# Patient Record
Sex: Male | Born: 1955 | Race: White | Hispanic: No | Smoking: Current every day smoker
Health system: Southern US, Community
[De-identification: ages and names within clinical notes are randomized; demographics above are authoritative.]

## PROBLEM LIST (undated history)

## (undated) ENCOUNTER — Emergency Department (HOSPITAL_COMMUNITY): Payer: 59

## (undated) DIAGNOSIS — G8929 Other chronic pain: Secondary | ICD-10-CM

## (undated) DIAGNOSIS — Z720 Tobacco use: Secondary | ICD-10-CM

## (undated) DIAGNOSIS — A6000 Herpesviral infection of urogenital system, unspecified: Secondary | ICD-10-CM

## (undated) DIAGNOSIS — G629 Polyneuropathy, unspecified: Secondary | ICD-10-CM

## (undated) DIAGNOSIS — F419 Anxiety disorder, unspecified: Secondary | ICD-10-CM

## (undated) DIAGNOSIS — G839 Paralytic syndrome, unspecified: Secondary | ICD-10-CM

## (undated) DIAGNOSIS — K5900 Constipation, unspecified: Secondary | ICD-10-CM

## (undated) DIAGNOSIS — M1990 Unspecified osteoarthritis, unspecified site: Secondary | ICD-10-CM

## (undated) DIAGNOSIS — E039 Hypothyroidism, unspecified: Secondary | ICD-10-CM

## (undated) DIAGNOSIS — N400 Enlarged prostate without lower urinary tract symptoms: Secondary | ICD-10-CM

## (undated) HISTORY — DX: Hypothyroidism, unspecified: E03.9

## (undated) HISTORY — DX: Paralytic syndrome, unspecified: G83.9

## (undated) HISTORY — DX: Tobacco use: Z72.0

## (undated) HISTORY — DX: Constipation, unspecified: K59.00

## (undated) HISTORY — DX: Herpesviral infection of urogenital system, unspecified: A60.00

## (undated) HISTORY — DX: Anxiety disorder, unspecified: F41.9

---

## 1980-11-07 HISTORY — PX: CHOLECYSTECTOMY: SHX55

## 1989-11-07 HISTORY — PX: MASTOIDECTOMY: SHX711

## 1994-11-07 HISTORY — PX: OTHER SURGICAL HISTORY: SHX169

## 1999-11-08 HISTORY — PX: COLONOSCOPY: SHX174

## 2002-04-14 ENCOUNTER — Emergency Department (HOSPITAL_COMMUNITY): Admission: EM | Admit: 2002-04-14 | Discharge: 2002-04-14 | Payer: Self-pay | Admitting: *Deleted

## 2002-04-14 ENCOUNTER — Encounter: Payer: Self-pay | Admitting: *Deleted

## 2006-06-07 ENCOUNTER — Emergency Department (HOSPITAL_COMMUNITY): Admission: EM | Admit: 2006-06-07 | Discharge: 2006-06-07 | Payer: Self-pay | Admitting: Emergency Medicine

## 2006-06-26 ENCOUNTER — Emergency Department (HOSPITAL_COMMUNITY): Admission: EM | Admit: 2006-06-26 | Discharge: 2006-06-26 | Payer: Self-pay | Admitting: Emergency Medicine

## 2006-07-06 ENCOUNTER — Inpatient Hospital Stay (HOSPITAL_COMMUNITY): Admission: EM | Admit: 2006-07-06 | Discharge: 2006-07-08 | Payer: Self-pay | Admitting: Emergency Medicine

## 2006-07-09 ENCOUNTER — Emergency Department (HOSPITAL_COMMUNITY): Admission: EM | Admit: 2006-07-09 | Discharge: 2006-07-09 | Payer: Self-pay | Admitting: Emergency Medicine

## 2006-08-01 ENCOUNTER — Emergency Department (HOSPITAL_COMMUNITY): Admission: EM | Admit: 2006-08-01 | Discharge: 2006-08-02 | Payer: Self-pay | Admitting: Emergency Medicine

## 2006-08-11 ENCOUNTER — Emergency Department (HOSPITAL_COMMUNITY): Admission: EM | Admit: 2006-08-11 | Discharge: 2006-08-11 | Payer: Self-pay | Admitting: Emergency Medicine

## 2007-07-16 ENCOUNTER — Emergency Department (HOSPITAL_COMMUNITY): Admission: EM | Admit: 2007-07-16 | Discharge: 2007-07-16 | Payer: Self-pay | Admitting: Emergency Medicine

## 2007-11-08 HISTORY — PX: NECK SURGERY: SHX720

## 2007-11-24 ENCOUNTER — Emergency Department (HOSPITAL_COMMUNITY): Admission: EM | Admit: 2007-11-24 | Discharge: 2007-11-24 | Payer: Self-pay | Admitting: Emergency Medicine

## 2008-03-01 ENCOUNTER — Emergency Department (HOSPITAL_COMMUNITY): Admission: EM | Admit: 2008-03-01 | Discharge: 2008-03-01 | Payer: Self-pay | Admitting: Emergency Medicine

## 2008-03-14 ENCOUNTER — Ambulatory Visit (HOSPITAL_COMMUNITY): Admission: RE | Admit: 2008-03-14 | Discharge: 2008-03-14 | Payer: Self-pay | Admitting: Pulmonary Disease

## 2008-06-02 ENCOUNTER — Emergency Department (HOSPITAL_COMMUNITY): Admission: EM | Admit: 2008-06-02 | Discharge: 2008-06-02 | Payer: Self-pay | Admitting: Emergency Medicine

## 2008-07-13 ENCOUNTER — Inpatient Hospital Stay (HOSPITAL_COMMUNITY): Admission: EM | Admit: 2008-07-13 | Discharge: 2008-08-12 | Payer: Self-pay | Admitting: Emergency Medicine

## 2008-07-13 ENCOUNTER — Encounter: Payer: Self-pay | Admitting: Emergency Medicine

## 2008-08-28 ENCOUNTER — Encounter (HOSPITAL_COMMUNITY): Admission: RE | Admit: 2008-08-28 | Discharge: 2008-09-27 | Payer: Self-pay | Admitting: Pulmonary Disease

## 2008-09-03 ENCOUNTER — Ambulatory Visit (HOSPITAL_COMMUNITY): Admission: RE | Admit: 2008-09-03 | Discharge: 2008-09-03 | Payer: Self-pay | Admitting: Pulmonary Disease

## 2008-09-30 ENCOUNTER — Encounter (HOSPITAL_COMMUNITY): Admission: RE | Admit: 2008-09-30 | Discharge: 2008-11-05 | Payer: Self-pay | Admitting: Pulmonary Disease

## 2008-10-03 ENCOUNTER — Ambulatory Visit (HOSPITAL_COMMUNITY): Admission: RE | Admit: 2008-10-03 | Discharge: 2008-10-03 | Payer: Self-pay | Admitting: Pulmonary Disease

## 2008-11-10 ENCOUNTER — Encounter (HOSPITAL_COMMUNITY): Admission: RE | Admit: 2008-11-10 | Discharge: 2008-12-10 | Payer: Self-pay | Admitting: Pulmonary Disease

## 2008-12-02 ENCOUNTER — Ambulatory Visit (HOSPITAL_COMMUNITY): Admission: RE | Admit: 2008-12-02 | Discharge: 2008-12-02 | Payer: Self-pay | Admitting: Pulmonary Disease

## 2009-01-29 ENCOUNTER — Emergency Department (HOSPITAL_COMMUNITY): Admission: EM | Admit: 2009-01-29 | Discharge: 2009-01-29 | Payer: Self-pay | Admitting: Emergency Medicine

## 2009-02-05 ENCOUNTER — Emergency Department (HOSPITAL_COMMUNITY): Admission: EM | Admit: 2009-02-05 | Discharge: 2009-02-05 | Payer: Self-pay | Admitting: Emergency Medicine

## 2009-02-10 ENCOUNTER — Ambulatory Visit (HOSPITAL_COMMUNITY): Admission: RE | Admit: 2009-02-10 | Discharge: 2009-02-10 | Payer: Self-pay | Admitting: Pulmonary Disease

## 2009-02-17 ENCOUNTER — Emergency Department (HOSPITAL_COMMUNITY): Admission: EM | Admit: 2009-02-17 | Discharge: 2009-02-17 | Payer: Self-pay | Admitting: Emergency Medicine

## 2009-03-11 ENCOUNTER — Emergency Department (HOSPITAL_COMMUNITY): Admission: EM | Admit: 2009-03-11 | Discharge: 2009-03-11 | Payer: Self-pay | Admitting: Emergency Medicine

## 2009-05-10 ENCOUNTER — Emergency Department (HOSPITAL_COMMUNITY): Admission: EM | Admit: 2009-05-10 | Discharge: 2009-05-10 | Payer: Self-pay | Admitting: Emergency Medicine

## 2009-05-31 ENCOUNTER — Emergency Department (HOSPITAL_COMMUNITY): Admission: EM | Admit: 2009-05-31 | Discharge: 2009-05-31 | Payer: Self-pay | Admitting: Emergency Medicine

## 2009-09-19 IMAGING — CR DG CHEST 1V PORT
1 series · 1 of 1 positions shown · non-contrast
Comparison: none

CLINICAL DATA: Chest pain, short of breath.
 PORTABLE CHEST ? 1 VIEW ? 11/24/07 ? 0049 hours:

[view not recorded]
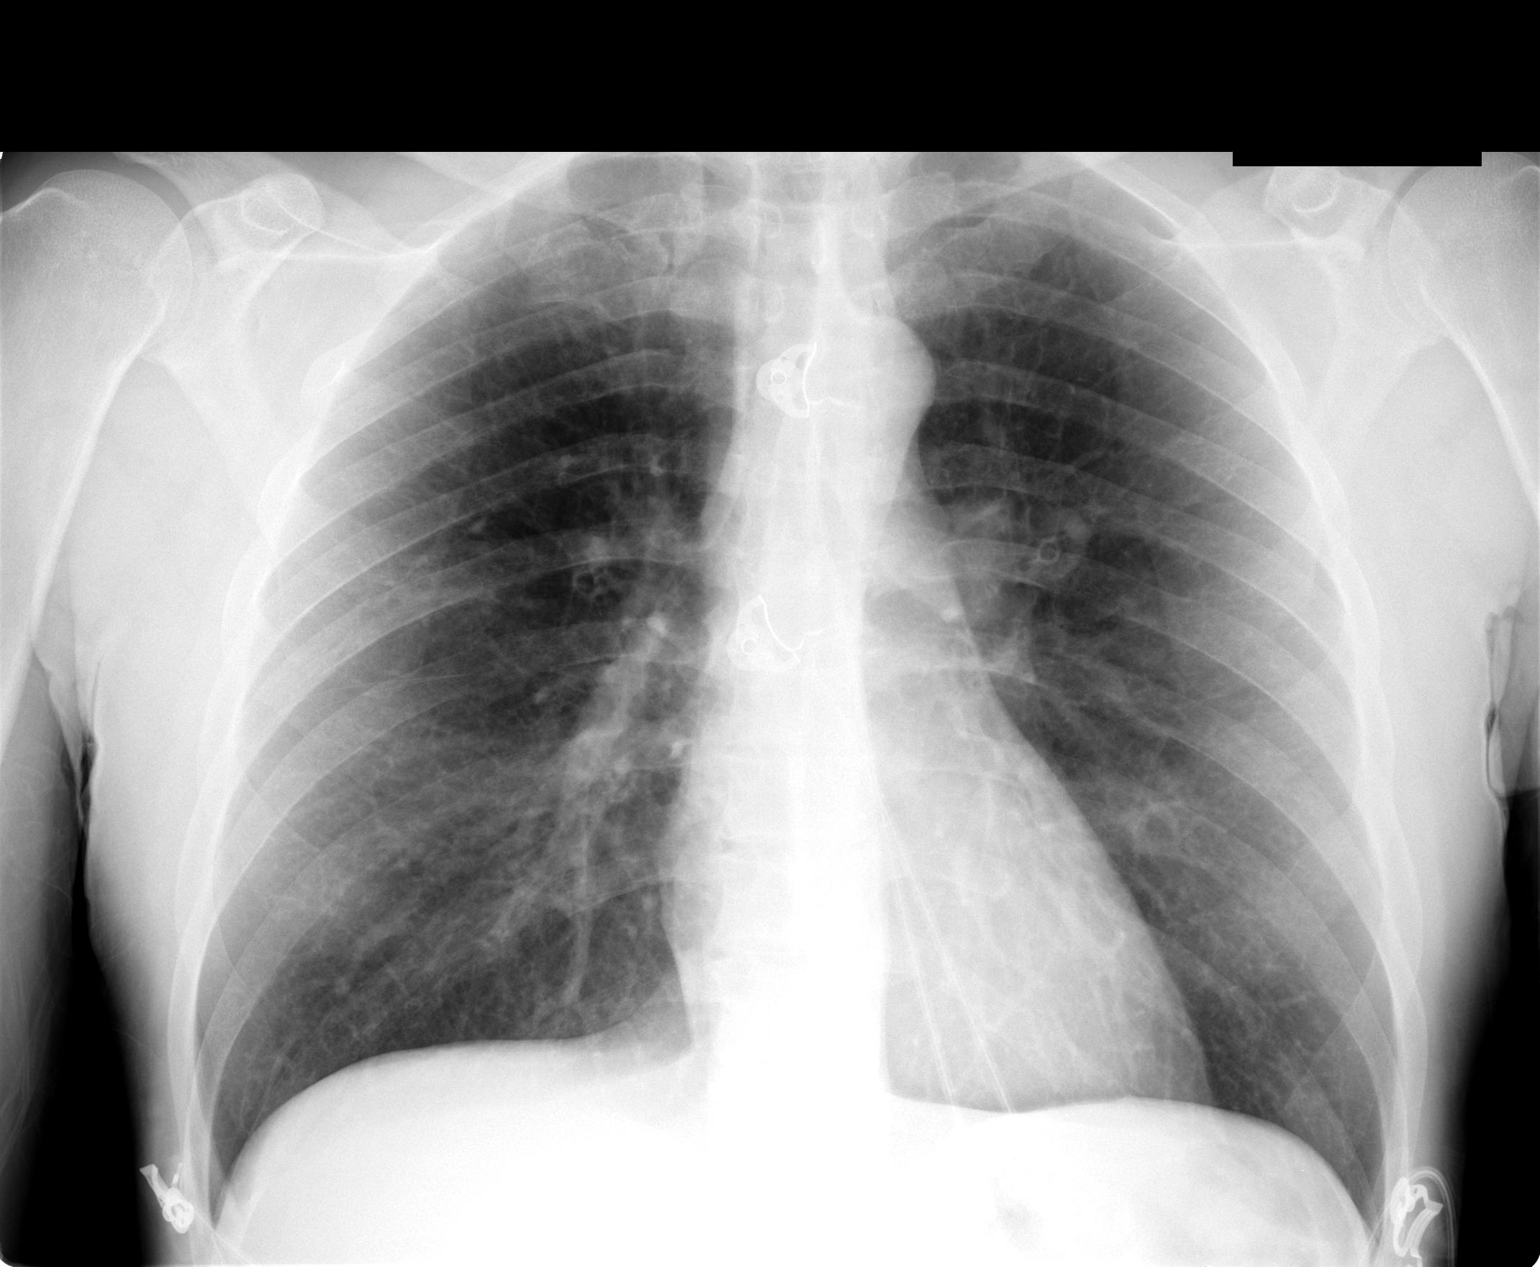

[1 of 1 positions shown; findings below may reference images not displayed]

FINDINGS: No infiltrate or effusion is seen.  The lungs are hyperaerated.  The heart is within normal limits in size.
IMPRESSION: No active lung disease.  Lungs are hyperaerated.

## 2010-01-03 ENCOUNTER — Emergency Department (HOSPITAL_COMMUNITY): Admission: EM | Admit: 2010-01-03 | Discharge: 2010-01-03 | Payer: Self-pay | Admitting: Emergency Medicine

## 2010-07-25 ENCOUNTER — Emergency Department (HOSPITAL_COMMUNITY): Admission: EM | Admit: 2010-07-25 | Discharge: 2010-07-25 | Payer: Self-pay | Admitting: Emergency Medicine

## 2011-02-13 LAB — COMPREHENSIVE METABOLIC PANEL
ALT: 14 U/L (ref 0–53)
AST: 16 U/L (ref 0–37)
Alkaline Phosphatase: 74 U/L (ref 39–117)
CO2: 29 mEq/L (ref 19–32)
Calcium: 9.2 mg/dL (ref 8.4–10.5)
Chloride: 106 mEq/L (ref 96–112)
GFR calc Af Amer: >60 mL/min (ref >60)
GFR calc non Af Amer: >60 mL/min (ref >60)
Glucose, Bld: 98 mg/dL (ref 70–99)
Sodium: 138 mEq/L (ref 135–145)
Total Bilirubin: 0.3 mg/dL (ref 0.3–1.2)

## 2011-02-13 LAB — DIFFERENTIAL
Basophils Absolute: 0 10*3/uL (ref 0.0–0.1)
Basophils Relative: 0 % (ref 0–1)
Eosinophils Absolute: 0.2 10*3/uL (ref 0.0–0.7)
Eosinophils Relative: 2 % (ref 0–5)
Lymphs Abs: 2.9 10*3/uL (ref 0.7–4.0)
Neutrophils Relative %: 56 % (ref 43–77)

## 2011-02-13 LAB — POCT CARDIAC MARKERS: Myoglobin, poc: 39.7 ng/mL (ref 12–200)

## 2011-02-13 LAB — CBC
Hemoglobin: 14.7 g/dL (ref 13.0–17.0)
MCHC: 35.1 g/dL (ref 30.0–36.0)
RBC: 4.64 MIL/uL (ref 4.22–5.81)
WBC: 8.5 10*3/uL (ref 4.0–10.5)

## 2011-02-16 LAB — DIFFERENTIAL
Basophils Absolute: 0 10*3/uL (ref 0.0–0.1)
Basophils Relative: 0 % (ref 0–1)
Eosinophils Absolute: 0.1 10*3/uL (ref 0.0–0.7)
Monocytes Relative: 6 % (ref 3–12)
Neutro Abs: 6.4 10*3/uL (ref 1.7–7.7)
Neutrophils Relative %: 74 % (ref 43–77)

## 2011-02-16 LAB — CBC
MCHC: 34 g/dL (ref 30.0–36.0)
MCV: 91.1 fL (ref 78.0–100.0)
RBC: 4.69 MIL/uL (ref 4.22–5.81)
RDW: 13.1 % (ref 11.5–15.5)

## 2011-02-16 LAB — URINALYSIS, ROUTINE W REFLEX MICROSCOPIC
Bilirubin Urine: NEGATIVE
Nitrite: NEGATIVE
Protein, ur: NEGATIVE mg/dL
Specific Gravity, Urine: 1.01 (ref 1.005–1.030)
Urobilinogen, UA: 0.2 mg/dL (ref 0.0–1.0)

## 2011-02-16 LAB — BASIC METABOLIC PANEL
BUN: 12 mg/dL (ref 6–23)
CO2: 26 mEq/L (ref 19–32)
Calcium: 9.4 mg/dL (ref 8.4–10.5)
Chloride: 97 mEq/L (ref 96–112)
Creatinine, Ser: 0.69 mg/dL (ref 0.4–1.5)
GFR calc Af Amer: >60 mL/min (ref >60)
Glucose, Bld: 95 mg/dL (ref 70–99)

## 2011-03-02 ENCOUNTER — Emergency Department (HOSPITAL_COMMUNITY)
Admission: EM | Admit: 2011-03-02 | Discharge: 2011-03-02 | Disposition: A | Discharge status: Home / Self Care | Payer: Medicare Other | Attending: Emergency Medicine | Admitting: Emergency Medicine

## 2011-03-02 DIAGNOSIS — Z79899 Other long term (current) drug therapy: Secondary | ICD-10-CM | POA: Insufficient documentation

## 2011-03-02 DIAGNOSIS — Y93E9 Activity, other interior property and clothing maintenance: Secondary | ICD-10-CM | POA: Insufficient documentation

## 2011-03-02 DIAGNOSIS — F411 Generalized anxiety disorder: Secondary | ICD-10-CM | POA: Insufficient documentation

## 2011-03-02 DIAGNOSIS — IMO0002 Reserved for concepts with insufficient information to code with codable children: Secondary | ICD-10-CM | POA: Insufficient documentation

## 2011-03-02 DIAGNOSIS — G43909 Migraine, unspecified, not intractable, without status migrainosus: Secondary | ICD-10-CM | POA: Insufficient documentation

## 2011-03-02 DIAGNOSIS — Y92009 Unspecified place in unspecified non-institutional (private) residence as the place of occurrence of the external cause: Secondary | ICD-10-CM | POA: Insufficient documentation

## 2011-03-02 DIAGNOSIS — W268XXA Contact with other sharp object(s), not elsewhere classified, initial encounter: Secondary | ICD-10-CM | POA: Insufficient documentation

## 2011-03-02 DIAGNOSIS — Y998 Other external cause status: Secondary | ICD-10-CM | POA: Insufficient documentation

## 2011-03-22 NOTE — Op Note (Signed)
Jaime [name], MILICH NO.:  [id number]   MEDICAL RECORD NO.:  [id number]          PATIENT TYPE:  INP   LOCATION:  3105                         FACILITY:  [facility]   PHYSICIAN:  Payton Doughty, M.D.      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   POSTOPERATIVE DIAGNOSIS:  Central cord injury with severe spondylosis at  C3-C4, C4-C5, C5-C6 and C6-C7.   PROCEDURE:  C3-C4, C4-C5, C5-C6 and C6-C7 anterior cervical  decompression and fusion with Reflex hybrid plate.   SURGEON:  Payton Doughty, MD   ANESTHESIA:  General endotracheal.   PREPARATION:  Prepped and draped with alcohol wipe.   COMPLICATIONS:  None.   NURSE ASSISTANT:  [place].   DOCTOR'S ASSISTANT:  Hilda Lias, MD   BODY OF TEXT:  This is a 55 year old gentleman with central cord  syndrome following a fall and severe spondylitic disease at the above-  named levels.  He was taken to the operating room, smoothly anesthetized  and intubated, and placed supine on the operating table.  The neck was  not extended during intubation nor in positioning.  He was placed in the  halter head traction without extension and 5 pounds of traction.  Following shave, prep, and drape in usual sterile fashion, the skin was  incised paralleling the sternocleidomastoid muscle starting 2  fingerbreadths above the sternal notch and extending up to within 2 cm  of the mandible.  The platysma was identified, elevated, divided, and  undermined.  Sternocleidomastoid was identified.  Medial dissection  revealed the carotid artery retracted laterally to the left.  Trachea  and esophagus were dissected free and retracted laterally to the right  exposing the bones in the anterior cervical spine.  Marker was placed.  Intraoperative x-ray  obtained to confirm correctness of the level.  The  longus colli was taken down bilaterally and Shadow-Line self-retaining  retractor was placed transversely in a cephalocaudal direction.  The  diskectomy was then carried out at each level under gross observation.  The operating microscope was brought in.  We used microdissection  technique to remove the remaining disk, removed posteriorly protruding  osteophytes, divided the posterior longitudinal ligament, and explored  the neural foramina bilaterally.  The C3-C4 and C6-C7 had significant  spondylitic disease with some compression of the cord.  C4-C5 and C5-C6  were the worst, both with large osteophytes and herniated disk at each  level.  Following complete decompression at each level, an 8-mm bone  graft was fashioned from patellar allograft and tapped into place.  Following placement of all the grafts, a 72-mm Reflex hybrid plate was  placed with 12-mm screws, two in C3, two in C4, two in C5, two in C6,  and two in C7.  Intraoperative x-ray showed good placement of  bone graft plate and screws.  Wound was irrigated.  Hemostasis assured.  Successive layers  of 3-0 Vicryl and 4-0 nylon were used to close.  Prior  to closure, a Jackson-Pratt drain was inserted through the prevertebral  space.  The patient placed in his Aspen collar and returned to the  recovery room in good condition.           ______________________________  Payton Doughty, M.D.     MWR/MEDQ  D:  07/18/2008  T:  07/19/2008  Job:  [id number]

## 2011-03-22 NOTE — H&P (Signed)
Jaime [name], GAWRON NO.:  [id number]   MEDICAL RECORD NO.:  [id number]          PATIENT TYPE:  INP   LOCATION:  3105                         FACILITY:  [facility]   PHYSICIAN:  Payton Doughty, M.D.      DATE OF BIRTH:  01/22/1956   DATE OF ADMISSION:  07/13/2008  DATE OF DISCHARGE:                              HISTORY & PHYSICAL   ADMISSION DIAGNOSIS:  Spinal cord injury, central cord syndrome.   SURGEON:  Payton Doughty, MD   BODY OF TEXT:  This 55 year old right-handed white gentleman fell  intoxicated last night and struck his head with onset of quadriparesis.  He was transported to [facility].  CT was negative for fracture.  He was  started on Solu-Medrol and transported to [facility].  He had an MRI that  shows diffuse spondylosis with cord contusion at C3-C7.  It is noted  that he is improved in his lower extremities and proximal arms but has  persistent weakness is his distal upper extremities and painful  dysesthesias in his hands.   MEDICAL HISTORY:  Negative for significant diseases.   SURGICAL HISTORY:  He has had a left acoustic neuroma resection about 12  years ago.  He has also had a right mastoidectomy.  He has also had a  cholecystectomy.   MEDICATIONS:  Vicodin and Xanax.   ALLERGIES:  SULFA.   SOCIAL HISTORY:  He does smoke a pack of cigarettes a day, drinks 5-6  beers a day, and is on disability.   FAMILY HISTORY:  Negative.   REVIEW OF SYSTEMS:  Remarkable for neck and hand pain.   PHYSICAL EXAMINATION:  HEENT:  He has contusion in left malar eminence  and orbit, but it is an old fracture on CT.  NECK:  Slightly tender.  He holds it rigidly.  CHEST:  He has coarse crackles with good breath sounds bilaterally.  CARDIAC:  Regular rate and rhythm without murmur.  ABDOMEN:  Soft with positive bowel sounds.  EXTREMITIES:  Without clubbing or cyanosis.  Pulses are good.  GU:  Deferred.  NEUROLOGIC:  He is awake, alert, and oriented.  His cranial  nerves  appear to function normally, except for diminished hearing, little bit  worse on the left than on the right.  Motor exam in the deltoid on the  right is 4+, left is 4+.  Biceps on the right is 4-, left is 4-.  On the  triceps, he has strength of 1, meaning there is contraction with little  movement of the joints.  His wrist extensors, intrinsic muscles, and  grips were all 1 bilaterally in his hands and these are limited by the  dysesthesias that he experiences.  Lower extremities have full strength.  Sensation is diminished over the C6-T2 dermatomes with painful  dysesthesias.  Reflexes are absent at the triceps and 1 at the biceps  and knees.  Hoffman's is negative.   CLINICAL IMPRESSION:  Central cord syndrome after a forced extension  injury with improving quadriparesis.  I am going to leave him on the  Solu-Medrol  and decompress and stabilize him probably on Wednesday.  Obviously, if he should worsen that would be an indication for urgent  decompression, but as he is improving, I think the safer course is to  let the steroid do its work and see if we can improve him more safely in  a couple of days.           ______________________________  Payton Doughty, M.D.     MWR/MEDQ  D:  07/13/2008  T:  07/13/2008  Job:  [id number]

## 2011-03-22 NOTE — Discharge Summary (Signed)
Jaime [name], Jaime [name] NO.:  [id number]   MEDICAL RECORD NO.:  [id number]          PATIENT TYPE:  INP   LOCATION:  3034                         FACILITY:  [facility]   PHYSICIAN:  Payton Doughty, M.D.      DATE OF BIRTH:  01/14/1956   DATE OF ADMISSION:  07/13/2008  DATE OF DISCHARGE:  08/12/2008                               DISCHARGE SUMMARY   ADMITTING DIAGNOSIS:  Spinal cord injury with central cord syndrome.   DISCHARGE DIAGNOSIS:  Spinal cord injury with central cord syndrome.   OPERATIVE PROCEDURE:  C3-4, C4-5, C5-6, and C6-7 anterior cervical  decompression and fusion, Reflex hybrid plate.   SURGEON:  Payton Doughty, MD, Neurosurgery.   COMPLICATIONS:  None.   DISCHARGE STATUS:  Alive.   BODY TEXT:  This is a 55 year old gentleman.  His history and physical  was recounted in the chart basically on July 12, 2008, he was drunk  at a bar fell and hit his head and became quadriparesis cleared except  for his hands.  He was transported to [facility], CT revealed a central cord  injury.  He was admitted to [facility] in the ICU and observed for several  days.  He actually improved, but still had painful dysesthesias in his  hands.  He was placed on steroid protocol and after several days, his  exam had plateaued where he had about 4/5 strength in his biceps, 0/5 in  his wrist extensors, and about 2/5 in his triceps.  On July 18, 2008, he was taken to the OR and underwent a fusion from C3-C7 with a  decompression in his cord.  Postoperatively, he has done reasonably  well, biggest issues has been dysphagia.  He underwent a swallowing  evaluation, was placed n.p.o. for sometime, and had a NG tube in.  During this time, he developed a little bit of DTs which was rapidly  corrective with Librium, which he was tapered off of.  He also developed  some rashes, was probably allergic to the NICOTINE PATCH that he had  been on.  Once that was stopped, his rash began clearing up.   He has  undergone physical therapy.  Speech has allowed his diet to progress to  dysphagia 1, which has pureed foods and  Thick-It in all his  liquids.  He is on a water protocol to help him practice swallowing liquids.  He  is able to get home.  He has a wheelchair at home.   PHYSICAL EXAMINATION:  Strength is full, save for the hands, which are  about 3-/5 on the right and 3+/5 on the left.  Incisions well healed.  He is out of his collar.   He is being discharged home in care of his family with Vicodin for pain.  His follow will be in the [place] offices in about a month.  He has got  home PT, OT, and speech set up.           ______________________________  Payton Doughty, M.D.    MWR/MEDQ  D:  08/12/2008  T:  08/12/2008  Job:  [id number]

## 2011-03-25 NOTE — H&P (Signed)
NAMEDSHAUN, [name]                 ACCOUNT NO.:  [id number]   MEDICAL RECORD NO.:  [id number]          PATIENT TYPE:  INP   LOCATION:  3036                         FACILITY:  [facility]   PHYSICIAN:  Jaime [name], M.D.DATE OF BIRTH:  April 26, 1956   DATE OF ADMISSION:  07/06/2006  DATE OF DISCHARGE:                                HISTORY & PHYSICAL   DATE OF ADMISSION:  July 06, 2006.   PRIMARY CARE PHYSICIAN:  Unassigned.  (Dr. Juanetta [name]).   CHIEF COMPLAINT:  Right ear pain and facial rash.   HISTORY OF PRESENT ILLNESS:  The patient is a 55 year old, white male who  present with the above complaints.  He states that a little over three weeks  ago, he began having a right earache and developed redness at the auricle of  that right side and so went to the emergency room in [place] on June 12, 2006.  At that time, he was given a prescription of Cipro for five days  and after completing the antibiotics, his symptoms had not improved any.  In  fact, he states that the redness had begun to spread from his ear/auricle  across his face and so he went in to see his primary care physician, Dr.  Juanetta [name], on June 18, 2006.  He states that Dr. Juanetta [name] gave him a  prescription for Cipro for ten more days but because the redness continued  to spread across his face and the pain in his ear was worsening, he went  back to the emergency room on June 26, 2006 and at that point, he was  referred to an ENT physician, Dr. Christella [name].  He states that  Dr. Christella [name] asked him to stop taking the Cipro which he had taken six of the  ten day prescription that his primary care physician had added on.  He  states that he was given hydrocortisone cream and ear drops and after he  began applying this medications, he states that his face began to slough  off, became scaly.  He states that the rash on his face is pruritic and he  has a burning sensation as well over his face.  Because his symptoms  continued to  progress, he came to the [facility] emergency room for further  evaluation.  He admits to subjective fevers.  He denies cough, nausea,  vomiting, hematemesis, chest pain, dysuria, melena and diarrhea.   The patient was seen in the emergency room and a maxillofacial CT scan was  done and it showed findings on the right, most compatible with superficial  cellulitis of the right ear and periauricular soft tissues, minimal mastoid  opacity present on the right without coalescence but soft tissue  opacification of the remaining mastoid air cells noted on the left with soft  tissue extension into the middle ear and in the setting of previous  bilateral surgery, the acuity of findings was noted to be unclear.  A  cholesteatoma cannot be excluded on the left.  ENT was consulted per the  emergency room physician and recommendations made for the patient to  be  placed on Avelox and Ciprodex drops and he recommended a medical evaluation  and the patient is admitted to the hospitalist's service for further  evaluation and management.   PAST MEDICAL HISTORY:  1. As stated above.  2. History of genital herpes.  3. History of panic attacks.  4. Tobacco abuse.  5. Hearing impairment.   MEDICATIONS:  1. Vicodin four times daily p.r.n.;  2. Valtrex 500 mg daily;  3. Xanax 0.5 mg three times daily p.r.n.   ALLERGIES:  1. SULFA.  2. CORTICOSPORINS.   SOCIAL HISTORY:  Positive for tobacco, occasional alcohol.   FAMILY HISTORY:  His dad had heart disease and lung cancer.  His sister has  melanoma.   REVIEW OF SYSTEMS:  As per history of present illness.  Other review of  systems negative.   PHYSICAL EXAMINATION:  GENERAL:  The patient is a pleasant, middle-aged,  white male.  He looks older than his stated age.  His whole face is very  erythematous.  He is in no apparent distress.  VITAL SIGNS:  Temperature 96.5 with a blood pressure of 114/76, pulse 67,  respiratory rate 18, oxygen saturation  96%.  HEENT:  Diffuse facial erythema, macular rash.  Pupils are equal, round and  reactive to light.  Extraocular movements are intact.  Sclerae are  anicteric.  Moist mucous membranes.  No oral exudates.  His right auricle is  very erythematous as well.  There is a fissure in the area of his right  earlobe with clotted blood.  The external canal is mildly edematous and not  very tender and the right tympanic membrane has some dry debris.  The left  ear appears to be within normal limits.  NECK:  There is some erythema extending down his neck.  Otherwise, supple,  no adenopathy and no thyromegaly.  No jugular venous distension.  LUNGS:  Clear to auscultation bilaterally.  No crackles or wheezes.  CARDIOVASCULAR:  Regular rate and rhythm.  Normal S1 and S2.  ABDOMEN:  Soft.  Bowel sounds present.  Nontender, nondistended.  No  organomegaly and no masses palpable.  EXTREMITIES:  No cyanosis and no edema.  NEUROLOGICAL:  Alert and oriented x3.  Cranial nerves II through XII grossly  intact.  Nonfocal  examination.   LABORATORY DATA:  Maxillofacial CT scan as above.   His white blood cell count is 13.6 with a hemoglobin of 16, hematocrit 46.1,  platelet count 184,000.  Neutrophil count 73%.  Sodium is 140, potassium  3.7, chloride 107, BUN 8, glucose 93, pH 7.32.  His creatinine is 1.0.   ASSESSMENT/PLAN:  1. Facial and right ear/periauricular cellulitis.  Will obtain blood      cultures, start on empiric antibiotics to include methicillin-resistant      Staphylococcus aureus coverage as per history discussed above.  The      patient has been on Cipro for a total of eleven days with no      improvement.  ENT consulted.  Will follow and consider a      dermatology consultation for recommendations if no improvement.  2. Otitis externa.  Ciprodex as recommended per ENT.  Will follow.  3. History of genital herpes.  Continue Valtrex.      Jaime [name], M.D.  Electronically  Signed     ACV/MEDQ  D:  07/07/2006  T:  07/07/2006  Job:  [id number]   cc:   Jaime [name], M.D.

## 2011-04-27 ENCOUNTER — Emergency Department (HOSPITAL_COMMUNITY)
Admission: EM | Admit: 2011-04-27 | Discharge: 2011-04-27 | Disposition: A | Discharge status: Home / Self Care | Payer: Medicare Other | Attending: Emergency Medicine | Admitting: Emergency Medicine

## 2011-04-27 DIAGNOSIS — Z23 Encounter for immunization: Secondary | ICD-10-CM | POA: Insufficient documentation

## 2011-04-27 DIAGNOSIS — A6000 Herpesviral infection of urogenital system, unspecified: Secondary | ICD-10-CM | POA: Insufficient documentation

## 2011-04-27 DIAGNOSIS — L0889 Other specified local infections of the skin and subcutaneous tissue: Secondary | ICD-10-CM | POA: Insufficient documentation

## 2011-04-27 DIAGNOSIS — Z79899 Other long term (current) drug therapy: Secondary | ICD-10-CM | POA: Insufficient documentation

## 2011-04-27 DIAGNOSIS — G43909 Migraine, unspecified, not intractable, without status migrainosus: Secondary | ICD-10-CM | POA: Insufficient documentation

## 2011-04-27 DIAGNOSIS — F411 Generalized anxiety disorder: Secondary | ICD-10-CM | POA: Insufficient documentation

## 2011-04-27 DIAGNOSIS — G8929 Other chronic pain: Secondary | ICD-10-CM | POA: Insufficient documentation

## 2011-07-29 LAB — BASIC METABOLIC PANEL
GFR calc non Af Amer: >60
Glucose, Bld: 100 — ABNORMAL HIGH
Potassium: 4.6
Sodium: 137

## 2011-07-29 LAB — CBC
HCT: 47
Hemoglobin: 15.9
RBC: 4.92
WBC: 10.6 — ABNORMAL HIGH

## 2011-07-29 LAB — DIFFERENTIAL
Eosinophils Relative: 1
Lymphocytes Relative: 28
Lymphs Abs: 2.9
Monocytes Absolute: 0.7
Monocytes Relative: 7

## 2011-07-29 LAB — POCT CARDIAC MARKERS
CKMB, poc: 2.5
Troponin i, poc: <0.05

## 2011-08-02 LAB — BASIC METABOLIC PANEL
BUN: 9
Calcium: 9.2
GFR calc non Af Amer: >60
Glucose, Bld: 114 — ABNORMAL HIGH
Potassium: 3.9
Sodium: 142

## 2011-08-02 LAB — URINE MICROSCOPIC-ADD ON

## 2011-08-02 LAB — DIFFERENTIAL
Basophils Absolute: 0
Eosinophils Relative: 0
Lymphocytes Relative: 25
Lymphs Abs: 2.7
Neutro Abs: 7.5
Neutrophils Relative %: 70

## 2011-08-02 LAB — CBC
HCT: 42.8
Platelets: 169
RDW: 12.9
WBC: 10.8 — ABNORMAL HIGH

## 2011-08-02 LAB — RAPID URINE DRUG SCREEN, HOSP PERFORMED
Amphetamines: NOT DETECTED
Cocaine: POSITIVE — AB
Tetrahydrocannabinol: NOT DETECTED

## 2011-08-02 LAB — URINALYSIS, ROUTINE W REFLEX MICROSCOPIC
Leukocytes, UA: NEGATIVE
Nitrite: NEGATIVE
Specific Gravity, Urine: 1.025
Urobilinogen, UA: 0.2
pH: 5

## 2011-08-02 LAB — ETHANOL: Alcohol, Ethyl (B): 82 — ABNORMAL HIGH

## 2011-08-08 LAB — BASIC METABOLIC PANEL
BUN: 21
CO2: 30
Calcium: 8.6
Chloride: 100
Creatinine, Ser: 0.71
GFR calc Af Amer: >60
GFR calc Af Amer: >60
GFR calc non Af Amer: >60
GFR calc non Af Amer: >60
Glucose, Bld: 85
Potassium: 4.2
Potassium: 4.3
Sodium: 132 — ABNORMAL LOW
Sodium: 134 — ABNORMAL LOW
Sodium: 136

## 2011-08-10 LAB — DIFFERENTIAL
Basophils Absolute: 0
Basophils Relative: 0
Eosinophils Absolute: 0.1
Eosinophils Relative: 2
Lymphs Abs: 3.7
Neutrophils Relative %: 43

## 2011-08-10 LAB — BASIC METABOLIC PANEL
BUN: 9
Calcium: 8.6
Calcium: 8.7
Chloride: 105
Chloride: 106
Creatinine, Ser: 0.83
Creatinine, Ser: 0.96
GFR calc Af Amer: >60
GFR calc Af Amer: >60
GFR calc non Af Amer: >60
Glucose, Bld: 99
Sodium: 140

## 2011-08-10 LAB — APTT
aPTT: 21 — ABNORMAL LOW
aPTT: 27

## 2011-08-10 LAB — CBC
HCT: 43.6
Hemoglobin: 14.6
Hemoglobin: 14.8
MCHC: 34
MCHC: 34.3
MCV: 97.2
MCV: 97.5
MCV: 97.7
Platelets: 166
RBC: 4.43
RBC: 4.46
RBC: 4.63
RDW: 13.2
WBC: 14 — ABNORMAL HIGH
WBC: 8.5
WBC: 8.5

## 2011-08-10 LAB — COMPREHENSIVE METABOLIC PANEL
ALT: 171 — ABNORMAL HIGH
CO2: 28
Calcium: 8.2 — ABNORMAL LOW
Chloride: 104
Creatinine, Ser: 0.84
GFR calc non Af Amer: >60
Glucose, Bld: 123 — ABNORMAL HIGH
Total Bilirubin: 0.5

## 2011-08-10 LAB — RAPID URINE DRUG SCREEN, HOSP PERFORMED
Barbiturates: NOT DETECTED
Opiates: NOT DETECTED

## 2011-08-10 LAB — PROTIME-INR
INR: 1
Prothrombin Time: 13.1
Prothrombin Time: 13.8

## 2011-08-10 LAB — ETHANOL: Alcohol, Ethyl (B): 186 — ABNORMAL HIGH

## 2011-10-28 ENCOUNTER — Emergency Department (HOSPITAL_COMMUNITY)
Admission: EM | Admit: 2011-10-28 | Discharge: 2011-10-28 | Disposition: A | Discharge status: Home / Self Care | Payer: Medicare Other | Attending: Emergency Medicine | Admitting: Emergency Medicine

## 2011-10-28 ENCOUNTER — Encounter: Payer: Self-pay | Admitting: Emergency Medicine

## 2011-10-28 DIAGNOSIS — K029 Dental caries, unspecified: Secondary | ICD-10-CM | POA: Insufficient documentation

## 2011-10-28 DIAGNOSIS — H9209 Otalgia, unspecified ear: Secondary | ICD-10-CM | POA: Insufficient documentation

## 2011-10-28 DIAGNOSIS — K0889 Other specified disorders of teeth and supporting structures: Secondary | ICD-10-CM

## 2011-10-28 DIAGNOSIS — Z79899 Other long term (current) drug therapy: Secondary | ICD-10-CM | POA: Insufficient documentation

## 2011-10-28 DIAGNOSIS — K089 Disorder of teeth and supporting structures, unspecified: Secondary | ICD-10-CM | POA: Insufficient documentation

## 2011-10-28 DIAGNOSIS — F17200 Nicotine dependence, unspecified, uncomplicated: Secondary | ICD-10-CM | POA: Insufficient documentation

## 2011-10-28 MED ORDER — HYDROCODONE-ACETAMINOPHEN 5-325 MG PO TABS
2.0000 | ORAL_TABLET | Freq: Once | ORAL | Status: AC
  Administered 2011-10-28: 2 via ORAL
  Filled 2011-10-28: qty 2

## 2011-10-28 NOTE — ED Notes (Signed)
Pt a/ox4. Resp even and unlabored. NAD at this time. D/C instructions reviewed with pt. Pt verbalized understanding. Pt ambulated to lobby with steady gate.  

## 2011-10-28 NOTE — ED Provider Notes (Signed)
History     CSN: [id number]  Arrival date & time 10/28/11  1519   First MD Initiated Contact with Patient 10/28/11 1608      Chief Complaint  Patient presents with  . Dental Pain  . Otalgia    (Consider location/radiation/quality/duration/timing/severity/associated sxs/prior treatment) HPI Comments: Pt has had a slowly decaying R lower molar "for a while".  It just began hurting yest.  Has a dentist in [place] but has not made an appt.  The history is provided by the patient. No language interpreter was used.    History reviewed. No pertinent past medical history.  Past Surgical History  Procedure Date  . Neck surgery     No family history on file.  History  Substance Use Topics  . Smoking status: Current Everyday Smoker    Types: Cigarettes  . Smokeless tobacco: Not on file  . Alcohol Use: No      Review of Systems  HENT: Positive for dental problem.   All other systems reviewed and are negative.    Allergies  Celebrex and Sulfa antibiotics  Home Medications   Current Outpatient Rx  Name Route Sig Dispense Refill  . ALPRAZOLAM 1 MG PO TABS Oral Take 1 mg by mouth 4 (four) times daily as needed. For nerves     . AMOXICILLIN-POT CLAVULANATE 875-125 MG PO TABS Oral Take 1 tablet by mouth 2 (two) times daily.      Marland Kitchen DIPHENHYDRAMINE HCL 25 MG PO TABS Oral Take 25 mg by mouth as needed. For itching     . HYDROCODONE-ACETAMINOPHEN 7.5-325 MG PO TABS Oral Take 1 tablet by mouth 4 (four) times daily as needed. For pain       BP 144/90  Pulse 85  Temp(Src) 98.8 F (37.1 C) (Oral)  Resp 17  Ht 5\' 10"  (1.778 m)  Wt 154 lb (69.854 kg)  BMI 22.10 kg/m2  SpO2 100%  Physical Exam  Nursing note and vitals reviewed. Constitutional: He is oriented to person, place, and time. He appears well-developed and well-nourished.  HENT:  Head: Normocephalic and atraumatic.  Right Ear: Hearing, tympanic membrane, external ear and ear canal normal.  Left Ear: Hearing,  tympanic membrane, external ear and ear canal normal.  Mouth/Throat: Oropharynx is clear and moist and mucous membranes are normal. Dental caries present. No dental abscesses or uvula swelling.         Involved tooth close to R ear and probably the same pain the pt is perceiving  In his R ear.  Eyes: EOM are normal.  Neck: Normal range of motion.  Cardiovascular: Normal rate, regular rhythm, normal heart sounds and intact distal pulses.   Pulmonary/Chest: Effort normal and breath sounds normal. No respiratory distress.  Abdominal: Soft. He exhibits no distension. There is no tenderness.  Musculoskeletal: Normal range of motion.  Neurological: He is alert and oriented to person, place, and time.  Skin: Skin is warm and dry.  Psychiatric: He has a normal mood and affect. Judgment normal.    ED Course  Procedures (including critical care time)  Labs Reviewed - No data to display No results found.   No diagnosis found.    MDM         Worthy Rancher, PA 10/28/11 1643  Worthy Rancher, PA 10/28/11 [phone]

## 2011-10-28 NOTE — ED Notes (Signed)
Pt c/o earache and dental pain since yesterday.

## 2011-10-29 NOTE — ED Provider Notes (Signed)
Medical screening examination/treatment/procedure(s) were performed by non-physician practitioner and as supervising physician I was immediately available for consultation/collaboration.  Tekelia Kareem, MD 10/29/11 0137 

## 2011-11-28 ENCOUNTER — Encounter (HOSPITAL_COMMUNITY): Payer: Self-pay | Admitting: *Deleted

## 2011-11-28 ENCOUNTER — Other Ambulatory Visit: Payer: Self-pay

## 2011-11-28 ENCOUNTER — Emergency Department (HOSPITAL_COMMUNITY)
Admission: EM | Admit: 2011-11-28 | Discharge: 2011-11-28 | Disposition: A | Discharge status: Home / Self Care | Payer: Medicare Other | Attending: Emergency Medicine | Admitting: Emergency Medicine

## 2011-11-28 ENCOUNTER — Emergency Department (HOSPITAL_COMMUNITY): Payer: Medicare Other

## 2011-11-28 DIAGNOSIS — G8929 Other chronic pain: Secondary | ICD-10-CM | POA: Insufficient documentation

## 2011-11-28 DIAGNOSIS — F17200 Nicotine dependence, unspecified, uncomplicated: Secondary | ICD-10-CM | POA: Insufficient documentation

## 2011-11-28 DIAGNOSIS — Z9889 Other specified postprocedural states: Secondary | ICD-10-CM | POA: Insufficient documentation

## 2011-11-28 DIAGNOSIS — R079 Chest pain, unspecified: Secondary | ICD-10-CM | POA: Insufficient documentation

## 2011-11-28 DIAGNOSIS — Z87828 Personal history of other (healed) physical injury and trauma: Secondary | ICD-10-CM | POA: Insufficient documentation

## 2011-11-28 HISTORY — DX: Other chronic pain: G89.29

## 2011-11-28 LAB — DIFFERENTIAL
Basophils Relative: 0 % (ref 0–1)
Eosinophils Absolute: 0.2 10*3/uL (ref 0.0–0.7)
Lymphs Abs: 4.9 10*3/uL — ABNORMAL HIGH (ref 0.7–4.0)
Monocytes Absolute: 0.8 10*3/uL (ref 0.1–1.0)
Monocytes Relative: 8 % (ref 3–12)
Neutrophils Relative %: 45 % (ref 43–77)

## 2011-11-28 LAB — CBC
HCT: 44.9 % (ref 39.0–52.0)
Hemoglobin: 15.2 g/dL (ref 13.0–17.0)
MCH: 31.9 pg (ref 26.0–34.0)
MCHC: 33.9 g/dL (ref 30.0–36.0)
MCV: 94.1 fL (ref 78.0–100.0)
RBC: 4.77 MIL/uL (ref 4.22–5.81)

## 2011-11-28 LAB — BASIC METABOLIC PANEL
BUN: 11 mg/dL (ref 6–23)
Creatinine, Ser: 0.69 mg/dL (ref 0.50–1.35)
GFR calc Af Amer: >90 mL/min (ref >90)
GFR calc non Af Amer: >90 mL/min (ref >90)
Glucose, Bld: 86 mg/dL (ref 70–99)
Potassium: 4.1 mEq/L (ref 3.5–5.1)

## 2011-11-28 MED ORDER — ASPIRIN 81 MG PO CHEW
324.0000 mg | CHEWABLE_TABLET | Freq: Once | ORAL | Status: AC
  Administered 2011-11-28: 324 mg via ORAL
  Filled 2011-11-28: qty 4

## 2011-11-28 MED ORDER — NITROGLYCERIN 0.4 MG SL SUBL
0.4000 mg | SUBLINGUAL_TABLET | Freq: Once | SUBLINGUAL | Status: AC
  Administered 2011-11-28: 0.4 mg via SUBLINGUAL

## 2011-11-28 MED ORDER — NITROGLYCERIN 0.4 MG SL SUBL
SUBLINGUAL_TABLET | SUBLINGUAL | Status: AC
  Administered 2011-11-28: 15:00:00
  Filled 2011-11-28: qty 25

## 2011-11-28 MED ORDER — SODIUM CHLORIDE 0.9 % IV SOLN
Freq: Once | INTRAVENOUS | Status: AC
  Administered 2011-11-28: 14:00:00 via INTRAVENOUS

## 2011-11-28 NOTE — ED Notes (Signed)
Chest pain for 6 months, states he had another episode start this am, states he has ha family history of heart attacks

## 2011-11-28 NOTE — ED Provider Notes (Signed)
This chart was scribed for Jaime Munch, MD by [name]. The patient was seen in room [facility] at 2:05 PM.  CSN: [id number]  Arrival date & time 11/28/11  1302   First MD Initiated Contact with Patient 11/28/11 1401      Chief Complaint  Patient presents with  . Chest Pain    (Consider location/radiation/quality/duration/timing/severity/associated sxs/prior treatment) HPI Jaime [name] is a 56 y.o. male who presents to the Emergency Department complaining of intermittent chest pain for the past 6 months. Current episode started this morning. Pt has a family history of heart attacks. Nothing he has tried makes the pain better. Pt broke neck 3 years ago and is currently taking hydrocodone for pain.  Past Medical History  Diagnosis Date  . Chronic pain     Past Surgical History  Procedure Date  . Neck surgery   . Cholecystectomy     No family history on file.  History  Substance Use Topics  . Smoking status: Current Everyday Smoker    Types: Cigarettes  . Smokeless tobacco: Not on file  . Alcohol Use: No      Review of Systems  Cardiovascular: Positive for chest pain.  All other systems reviewed and are negative.    Allergies  Celebrex; Latex; and Sulfa antibiotics  Home Medications   Current Outpatient Rx  Name Route Sig Dispense Refill  . ALPRAZOLAM 1 MG PO TABS Oral Take 1 mg by mouth 4 (four) times daily as needed. For nerves     . DIPHENHYDRAMINE HCL 25 MG PO TABS Oral Take 25 mg by mouth as needed. For itching     . MUCINEX PO Oral Take 1 tablet by mouth every 6 (six) hours as needed. For congestion    . HYDROCODONE-ACETAMINOPHEN 7.5-325 MG PO TABS Oral Take 1 tablet by mouth 4 (four) times daily as needed. For pain     Cardiac monitor   Rate: 99 bpm   Rhythm:  Pulse oximetry on room air is 96%. Normal by my interpretation.   Date: 11/28/2011  Rate: 99  Rhythm: normal sinus rhythm  QRS Axis: normal  Intervals: normal  ST/T Wave  abnormalities: normal  Conduction Disutrbances:non-specific delay  Narrative Interpretation:   Old EKG Reviewed: changes noted ABNORMAL (Minimally)   BP 117/70  Pulse 99  Temp(Src) 98.1 F (36.7 C) (Oral)  Resp 20  Ht 5\' 11"  (1.803 m)  Wt 154 lb (69.854 kg)  BMI 21.48 kg/m2  SpO2 96%  Physical Exam  Nursing note and vitals reviewed. Constitutional: He is oriented to person, place, and time. He appears well-developed and well-nourished.  HENT:  Head: Normocephalic and atraumatic.  Neck: Normal range of motion. Neck supple.  Cardiovascular: Normal rate, regular rhythm and normal heart sounds.   Pulmonary/Chest: Effort normal and breath sounds normal.  Neurological: He is alert and oriented to person, place, and time.  Skin: Skin is warm and dry.  Psychiatric: He has a normal mood and affect. His behavior is normal.    ED Course  Procedures (including critical care time)   Labs Reviewed  CBC  DIFFERENTIAL  BASIC METABOLIC PANEL  TROPONIN I   No results found.   No diagnosis found.  CXR, viewed by me.  Interstitial markings. abn  MDM  I personally performed the services described in this documentation, which was scribed in my presence. The recorded information has been reviewed and considered.  56yo M w chronic cp now p/w "the same" pain as he has  frequently.  On exam the patient is in no distress w unremarkable VS.  The patient's labs, ecg are reassuring, and his CXR is suggestive of pulm pathology which may be contributing to his chronic pain. (Patient had no RF for PE, and had no PE c/w this either.) All results were discussed at length with the patient.  He was discharged in stable condition.   Jaime Munch, MD 11/29/11 [phone]

## 2011-11-28 NOTE — ED Notes (Signed)
Sinus rhythm, alert, skin warm and dry, color wnl.  Chest pain since 8 am.

## 2011-12-01 ENCOUNTER — Ambulatory Visit (HOSPITAL_COMMUNITY)
Admission: RE | Admit: 2011-12-01 | Discharge: 2011-12-01 | Disposition: A | Discharge status: Home / Self Care | Payer: Medicare Other | Source: Ambulatory Visit | Attending: Pulmonary Disease | Admitting: Pulmonary Disease

## 2011-12-01 DIAGNOSIS — R079 Chest pain, unspecified: Secondary | ICD-10-CM | POA: Insufficient documentation

## 2011-12-01 DIAGNOSIS — R072 Precordial pain: Secondary | ICD-10-CM

## 2011-12-01 DIAGNOSIS — Z8249 Family history of ischemic heart disease and other diseases of the circulatory system: Secondary | ICD-10-CM | POA: Insufficient documentation

## 2011-12-01 NOTE — Progress Notes (Signed)
*  PRELIMINARY RESULTS* Echocardiogram 2D Echocardiogram has been performed.  Jaime [name] 12/01/2011, 9:39 AM

## 2011-12-14 ENCOUNTER — Encounter: Payer: Self-pay | Admitting: Cardiology

## 2011-12-14 ENCOUNTER — Ambulatory Visit (INDEPENDENT_AMBULATORY_CARE_PROVIDER_SITE_OTHER): Payer: Medicare Other | Admitting: Cardiology

## 2011-12-14 DIAGNOSIS — Z720 Tobacco use: Secondary | ICD-10-CM | POA: Insufficient documentation

## 2011-12-14 DIAGNOSIS — R0789 Other chest pain: Secondary | ICD-10-CM | POA: Insufficient documentation

## 2011-12-14 DIAGNOSIS — F17200 Nicotine dependence, unspecified, uncomplicated: Secondary | ICD-10-CM

## 2011-12-14 DIAGNOSIS — R079 Chest pain, unspecified: Secondary | ICD-10-CM

## 2011-12-14 NOTE — Assessment & Plan Note (Signed)
His symptoms of chest pain or atypical. However, he does have significant risk factors including history of tobacco abuse and family history of coronary disease. I recommended a Lexiscan Myoview study to further evaluate his cardiovascular risk. If normal then I'll recommend risk factor modification. If abnormal he would need a cardiac catheterization.

## 2011-12-14 NOTE — Assessment & Plan Note (Signed)
I recommended tobacco cessation to minimize his cardiovascular risk. We discussed smoking cessation strategies.

## 2011-12-14 NOTE — Patient Instructions (Signed)
We will schedule you for a nuclear stress test  You need to quit smoking!   
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
## 2011-12-20 ENCOUNTER — Ambulatory Visit (HOSPITAL_COMMUNITY): Payer: Medicare Other | Attending: Cardiology | Admitting: Radiology

## 2011-12-20 DIAGNOSIS — R079 Chest pain, unspecified: Secondary | ICD-10-CM

## 2011-12-20 DIAGNOSIS — R42 Dizziness and giddiness: Secondary | ICD-10-CM | POA: Insufficient documentation

## 2011-12-20 DIAGNOSIS — R0602 Shortness of breath: Secondary | ICD-10-CM | POA: Insufficient documentation

## 2011-12-20 DIAGNOSIS — R002 Palpitations: Secondary | ICD-10-CM | POA: Insufficient documentation

## 2011-12-20 DIAGNOSIS — Z8249 Family history of ischemic heart disease and other diseases of the circulatory system: Secondary | ICD-10-CM | POA: Insufficient documentation

## 2011-12-20 DIAGNOSIS — Z720 Tobacco use: Secondary | ICD-10-CM

## 2011-12-20 DIAGNOSIS — R5381 Other malaise: Secondary | ICD-10-CM | POA: Insufficient documentation

## 2011-12-20 DIAGNOSIS — F17200 Nicotine dependence, unspecified, uncomplicated: Secondary | ICD-10-CM | POA: Insufficient documentation

## 2011-12-20 MED ORDER — TECHNETIUM TC 99M TETROFOSMIN IV KIT
33.0000 | PACK | Freq: Once | INTRAVENOUS | Status: AC | PRN
  Administered 2011-12-20: 33 via INTRAVENOUS

## 2011-12-20 MED ORDER — TECHNETIUM TC 99M TETROFOSMIN IV KIT
11.0000 | PACK | Freq: Once | INTRAVENOUS | Status: AC | PRN
  Administered 2011-12-20: 11 via INTRAVENOUS

## 2011-12-20 MED ORDER — REGADENOSON 0.4 MG/5ML IV SOLN
0.4000 mg | Freq: Once | INTRAVENOUS | Status: AC
  Administered 2011-12-20: 0.4 mg via INTRAVENOUS

## 2011-12-20 NOTE — Progress Notes (Signed)
[facility] SITE 3 NUCLEAR MED [address] [place] [address] [phone]  Cardiology Nuclear Med Study  Jaime [name] is a 56 y.o. male [id number] Apr 03, 1956   Nuclear Med Background Indication for Stress Test:  Evaluation for Ischemia, and Patient seen in hospital on 11/28/11 for CP, Enzymes negative History: 12/01/11 Echo:EF=55-60%,normal and Stress Echo approx. '97;normal per patient Cardiac Risk Factors: Family History - CAD  Symptoms:  Chest Pain,Tightness with Exertion (last date of chest discomfort 3-4 days ago;minimal amount of chest tightness prior to test), Dizziness, Fatigue, Palpitations and SOB   Nuclear Pre-Procedure Caffeine/Decaff Intake:  None NPO After: 5:00pm yesterday   Lungs:  clear IV 0.9% NS with Angio Cath:  22g  IV Site: L Antecubital x 1, tolerated well IV Started by:  Irean Hong, RN  Chest Size (in):  38 Cup Size: n/a  Height: 5\' 10"  (1.778 m)  Weight:  150 lb (68.04 kg)  BMI:  Body mass index is 21.52 kg/(m^2). Tech Comments:  n/a    Nuclear Med Study 1 or 2 day study: 1 day  Stress Test Type:  Lexiscan  Reading MD: Willa Rough, MD  Order Authorizing Provider:  Peter Swaziland, MD  Resting Radionuclide: Technetium 66m Tetrofosmin  Resting Radionuclide Dose: 11.0 mCi   Stress Radionuclide:  Technetium 11m Tetrofosmin  Stress Radionuclide Dose: 33.0 mCi           Stress Protocol Rest HR: 69 Stress HR: 115  Rest BP: 100/78 Stress BP: 95/82;Patient BP continued to decrease with Lexiscan infusion and remained hypotensive 30 mins in recovery.  Patient was placed in [place] position and  given 500cc NS IV bolus for BP. Patient BP lowest BP was 71/50.  At end of 40 mins of recovery,patient BP was 110/70.  Exercise Time (min): n/a METS: n/a   Predicted Max HR: 163 bpm % Max HR: 70.55 bpm Rate Pressure Product: 21308   Dose of Adenosine (mg):  n/a Dose of Lexiscan: 0.4 mg  Dose of Atropine (mg): n/a Dose of  Dobutamine: n/a mcg/kg/min (at max HR)  Stress Test Technologist: Cathlyn Parsons, RN  Nuclear Technologist:  Domenic Polite, CNMT     Rest Procedure:  Myocardial perfusion imaging was performed at rest 45 minutes following the intravenous administration of Technetium 24m Tetrofosmin. Rest ECG: NSR  Stress Procedure:  The patient received IV Lexiscan 0.4 mg over 15-seconds.  Technetium 61m Tetrofosmin injected at 30-seconds.  There were no significant changes with Lexiscan. Patient had hypotensive episode with infusion and lasted in recovery. See above note.Rare PAC. Quantitative spect images were obtained after a 45 minute delay. Stress ECG: No significant change from baseline ECG  QPS Raw Data Images:  Patient motion noted; appropriate software correction applied. Stress Images:  Normal homogeneous uptake in all areas of the myocardium. Rest Images:  Normal homogeneous uptake in all areas of the myocardium. Subtraction (SDS):  No evidence of ischemia. Transient Ischemic Dilatation (Normal <1.22):  1.04 Lung/Heart Ratio (Normal <0.45):  0.32  Quantitative Gated Spect Images QGS EDV:  89 ml QGS ESV:  39 ml QGS cine images:  Normal Wall Motion QGS EF: 56%  Impression Exercise Capacity:  Lexiscan with no exercise. BP Response:  Normal blood pressure response. Clinical Symptoms:  Dizzy ECG Impression:  No significant ST segment change suggestive of ischemia. Comparison with Prior Nuclear Study: No images to compare  Overall Impression:  Normal stress nuclear study.  There is interference from nuclear activity from structures  below the diaphragm. This is seen primarily in the rest images. However this does not significantly affect the ability to read the study. Overall there is no significant abnormality.  Willa Rough, MD

## 2012-05-11 ENCOUNTER — Other Ambulatory Visit: Payer: Self-pay | Admitting: Urgent Care

## 2012-05-11 ENCOUNTER — Ambulatory Visit (INDEPENDENT_AMBULATORY_CARE_PROVIDER_SITE_OTHER): Payer: Medicare Other | Admitting: Urgent Care

## 2012-05-11 ENCOUNTER — Other Ambulatory Visit: Payer: Self-pay | Admitting: Internal Medicine

## 2012-05-11 ENCOUNTER — Encounter: Payer: Self-pay | Admitting: Urgent Care

## 2012-05-11 VITALS — BP 119/78 | HR 80 | Temp 98.1°F | Ht 70.0 in | Wt 154.0 lb

## 2012-05-11 DIAGNOSIS — K5909 Other constipation: Secondary | ICD-10-CM | POA: Insufficient documentation

## 2012-05-11 DIAGNOSIS — K5900 Constipation, unspecified: Secondary | ICD-10-CM

## 2012-05-11 DIAGNOSIS — Z1211 Encounter for screening for malignant neoplasm of colon: Secondary | ICD-10-CM

## 2012-05-11 MED ORDER — PEG 3350-KCL-NA BICARB-NACL 420 G PO SOLR: ORAL | Status: AC

## 2012-05-11 MED ORDER — PROMETHAZINE HCL 25 MG/ML IJ SOLN: 25.0000 mg | Freq: Once | INTRAMUSCULAR | Status: DC

## 2012-05-11 NOTE — Patient Instructions (Addendum)
1-800-QUIT-NOW for help quitting smoking You need colonoscopy w/ Dr Jena Gauss in near future Continue Miralax 17 grams daily as needed Constipation in Adults Constipation is having fewer than 2 bowel movements per week. Usually, the stools are hard. As we grow older, constipation is more common. If you try to fix constipation with laxatives, the problem may get worse. This is because laxatives taken over a long period of time make the colon muscles weaker. A low-fiber diet, not taking in enough fluids, and taking some medicines may make these problems worse. MEDICATIONS THAT MAY CAUSE CONSTIPATION  Water pills (diuretics).   Calcium channel blockers (used to control blood pressure and for the heart).   Certain pain medicines (narcotics).   Anticholinergics.   Anti-inflammatory agents.   Antacids that contain aluminum.  DISEASES THAT CONTRIBUTE TO CONSTIPATION  Diabetes.   Parkinson's disease.   Dementia.   Stroke.   Depression.   Illnesses that cause problems with salt and water metabolism.  HOME CARE INSTRUCTIONS   Constipation is usually best cared for without medicines. Increasing dietary fiber and eating more fruits and vegetables is the best way to manage constipation.   Slowly increase fiber intake to 25 to 38 grams per day. Whole grains, fruits, vegetables, and legumes are good sources of fiber. A dietitian can further help you incorporate high-fiber foods into your diet.   Drink enough water and fluids to keep your urine clear or pale yellow.   A fiber supplement may be added to your diet if you cannot get enough fiber from foods.   Increasing your activities also helps improve regularity.   Suppositories, as suggested by your caregiver, will also help. If you are using antacids, such as aluminum or calcium containing products, it will be helpful to switch to products containing magnesium if your caregiver says it is okay.   If you have been given a liquid injection  (enema) today, this is only a temporary measure. It should not be relied on for treatment of longstanding (chronic) constipation.   Stronger measures, such as magnesium sulfate, should be avoided if possible. This may cause uncontrollable diarrhea. Using magnesium sulfate may not allow you time to make it to the bathroom.  SEEK IMMEDIATE MEDICAL CARE IF:   There is bright red blood in the stool.   The constipation stays for more than 4 days.   There is belly (abdominal) or rectal pain.   You do not seem to be getting better.   You have any questions or concerns.  MAKE SURE YOU:   Understand these instructions.   Will watch your condition.   Will get help right away if you are not doing well or get worse.  Document Released: 07/22/2004 Document Revised: 10/13/2011 Document Reviewed: 09/27/2011 [facility] Patient Information 2012 [place], [place].

## 2012-05-11 NOTE — Assessment & Plan Note (Addendum)
Jaime [name] is a pleasant 56 y.o. male with chronic constipation in the setting of chronic narcotic use. No alarm features. He is due for screening colonoscopy.  I have discussed risks & benefits which include, but are not limited to, bleeding, infection, perforation & drug reaction.  The patient agrees with this plan & written consent will be obtained.    Phenergan 25mg  IV 30 minutes prior to procedure. 1-800-QUIT-NOW for help quitting smoking Continue Miralax 17 grams daily as needed Constipation literature
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
## 2012-05-14 ENCOUNTER — Encounter (HOSPITAL_COMMUNITY): Payer: Self-pay | Admitting: Pharmacy Technician

## 2012-05-14 NOTE — Progress Notes (Signed)
Faxed to PCP

## 2012-05-21 ENCOUNTER — Telehealth: Payer: Self-pay | Admitting: Internal Medicine

## 2012-05-21 NOTE — Telephone Encounter (Signed)
Patient would not be given any dye or contrast for procedures. Please be sure they mention their concerns to the nurse & Dr. Jena Gauss the day of the procedure about his BP, but he will be on a montior. I will make a record in his chart and for this Dr. Jena Gauss. Thanks

## 2012-05-21 NOTE — Telephone Encounter (Signed)
Called and informed pt.  

## 2012-05-21 NOTE — Telephone Encounter (Signed)
Pt is scheduled for a procedure and his wife called to let us know that she is worried about his BP dropping because it has happened before. She had questions weather or not he would be given something for pain. I told the lady that called that I would have nurse call regarding his medications.

## 2012-05-21 NOTE — Telephone Encounter (Signed)
I returned call and spoke to the girl friend. She said pt just wants doctors/nurses to be aware of him having had a recnet episode of his BP dropping when he was having some tests for his heart. She said they used dye,  And he was not sure if that is what caused it or not. He is scheduled for colonoscopy with [facility] on 05/30/2012.

## 2012-05-29 MED ORDER — SODIUM CHLORIDE 0.45 % IV SOLN
Freq: Once | INTRAVENOUS | Status: AC
  Administered 2012-05-30: 1000 mL via INTRAVENOUS

## 2012-05-30 ENCOUNTER — Encounter (HOSPITAL_COMMUNITY)
Admission: RE | Disposition: A | Discharge status: Home / Self Care | Payer: Self-pay | Source: Ambulatory Visit | Attending: Internal Medicine

## 2012-05-30 ENCOUNTER — Other Ambulatory Visit: Payer: Self-pay | Admitting: Internal Medicine

## 2012-05-30 ENCOUNTER — Encounter (HOSPITAL_COMMUNITY): Payer: Self-pay | Admitting: *Deleted

## 2012-05-30 ENCOUNTER — Ambulatory Visit (HOSPITAL_COMMUNITY)
Admission: RE | Admit: 2012-05-30 | Discharge: 2012-05-30 | Disposition: A | Discharge status: Home / Self Care | Payer: Medicare Other | Source: Ambulatory Visit | Attending: Internal Medicine | Admitting: Internal Medicine

## 2012-05-30 DIAGNOSIS — Z1211 Encounter for screening for malignant neoplasm of colon: Secondary | ICD-10-CM

## 2012-05-30 DIAGNOSIS — K5909 Other constipation: Secondary | ICD-10-CM

## 2012-05-30 DIAGNOSIS — K620 Anal polyp: Secondary | ICD-10-CM

## 2012-05-30 DIAGNOSIS — D126 Benign neoplasm of colon, unspecified: Secondary | ICD-10-CM | POA: Insufficient documentation

## 2012-05-30 DIAGNOSIS — K621 Rectal polyp: Secondary | ICD-10-CM

## 2012-05-30 DIAGNOSIS — D129 Benign neoplasm of anus and anal canal: Secondary | ICD-10-CM | POA: Insufficient documentation

## 2012-05-30 DIAGNOSIS — D128 Benign neoplasm of rectum: Secondary | ICD-10-CM | POA: Insufficient documentation

## 2012-05-30 HISTORY — PX: COLONOSCOPY: SHX5424

## 2012-05-30 SURGERY — COLONOSCOPY
Anesthesia: Moderate Sedation

## 2012-05-30 MED ORDER — MIDAZOLAM HCL 5 MG/5ML IJ SOLN
INTRAMUSCULAR | Status: AC
  Filled 2012-05-30: qty 10

## 2012-05-30 MED ORDER — MIDAZOLAM HCL 5 MG/5ML IJ SOLN
INTRAMUSCULAR | Status: DC | PRN
  Administered 2012-05-30 (×3): 2 mg via INTRAVENOUS

## 2012-05-30 MED ORDER — SIMETHICONE 40 MG/0.6ML PO SUSP
ORAL | Status: DC | PRN
  Administered 2012-05-30: 12:00:00

## 2012-05-30 MED ORDER — MEPERIDINE HCL 100 MG/ML IJ SOLN
INTRAMUSCULAR | Status: DC | PRN
  Administered 2012-05-30 (×3): 50 mg via INTRAVENOUS

## 2012-05-30 MED ORDER — SODIUM CHLORIDE 0.9 % IJ SOLN
INTRAMUSCULAR | Status: AC
  Filled 2012-05-30: qty 10

## 2012-05-30 MED ORDER — MEPERIDINE HCL 100 MG/ML IJ SOLN
INTRAMUSCULAR | Status: AC
  Filled 2012-05-30: qty 2

## 2012-05-30 MED ORDER — PROMETHAZINE HCL 25 MG/ML IJ SOLN
INTRAMUSCULAR | Status: AC
  Filled 2012-05-30: qty 1

## 2012-05-30 NOTE — Interval H&P Note (Signed)
History and Physical Interval Note:  05/30/2012 11:37 AM  Jaime [name]  has presented today for surgery, with the diagnosis of CHRONIC CONSTIPATION  The various methods of treatment have been discussed with the patient and family. After consideration of risks, benefits and other options for treatment, the patient has consented to  Procedure(s) (LRB): COLONOSCOPY (N/A) as a surgical intervention .  The patient's history has been reviewed, patient examined, no change in status, stable for surgery.  I have reviewed the patient's chart and labs.  Questions were answered to the patient's satisfaction.     Eula Listen

## 2012-05-30 NOTE — Op Note (Signed)
[facility] [address] [place], [place]  [address]  COLONOSCOPY PROCEDURE REPORT  PATIENT:  [name], Jaime  MR#:  [id number] BIRTHDATE:  [birth year], 55 yrs. old  GENDER:  male ENDOSCOPIST:  R. Roetta Sessions, MD FACP [facility] REF. BY:  Kari Baars, M.D. PROCEDURE DATE:  05/30/2012 PROCEDURE:  Colonoscopy with polyp ablation, biopsy and snare polypectomy  INDICATIONS:  Colorectal cancer screening-average risk; recent constipation now much improved on MiraLax.  INFORMED CONSENT:  The risks, benefits, alternatives and imponderables including but not limited to bleeding, perforation as well as the possibility of a missed lesion have been reviewed. The potential for biopsy, lesion removal, etc. have also been discussed.  Questions have been answered.  All parties agreeable. Please see the history and physical in the medical record for more information.  MEDICATIONS:  Versed 6 mg IV and Demerol 150 mg IV in divided doses.  DESCRIPTION OF PROCEDURE:  After a digital rectal exam was performed, the EC-[id number] ([id number]) colonoscope was advanced from the anus through the rectum and colon to the area of the cecum, ileocecal valve and appendiceal orifice.  The cecum was deeply intubated.  These structures were well-seen and photographed for the record.  From the level of the cecum and ileocecal valve, the scope was slowly and cautiously withdrawn.  The mucosal surfaces were carefully surveyed utilizing scope tip deflection to facilitate fold flattening as needed.  The scope was pulled down into the rectum where a thorough examination including retroflexion was performed. <<PROCEDUREIMAGES>>  FINDINGS: Adequate preparation. Multiple diminutive distal rectal polyps (1) 4 mm polyp just inside the anal verge; otherwise, normal rectum. 7 mm pedunculated midsigmoid polyp.  Diminutive polyps noted in the mid sigmoid and mid descending segment as well; the remainder, mucosa appeared  normal.  THERAPEUTIC / DIAGNOSTIC MANEUVERS PERFORMED:  Multiple diminutive rectal polyps ablated with the tip of the hot snare loop; the largest distal rectal polyp was hot snare removed. The sigmoid polyp was hot snare removed. Adjacent diminutive polyps were ablated with tip of the hot snare loop. Diminutive descending colon polyps were cold biopsied.  COMPLICATIONS:  None  CECAL WITHDRAWAL TIME: 16 minutes  IMPRESSION: Rectal and colonic polyps-treated/removed as described above  RECOMMENDATIONS:  Follow up on pathology. MiraLax 1-2 doses a daily as needed for constipation.  ______________________________ R. Roetta Sessions, MD Cale[facility] Essex  CC:  Kari Baars, M.D.  n. eSIGNED:   R. Roetta Sessions at 05/30/2012 12:25 PM  Lupita Dawn, [phone]
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
## 2012-06-03 ENCOUNTER — Encounter: Payer: Self-pay | Admitting: Internal Medicine

## 2012-06-04 ENCOUNTER — Encounter (HOSPITAL_COMMUNITY): Payer: Self-pay | Admitting: Internal Medicine

## 2012-06-04 ENCOUNTER — Encounter: Payer: Self-pay | Admitting: *Deleted

## 2013-11-26 ENCOUNTER — Ambulatory Visit (HOSPITAL_COMMUNITY)
Admission: RE | Admit: 2013-11-26 | Discharge: 2013-11-26 | Disposition: A | Discharge status: Home / Self Care | Payer: Medicare Other | Source: Ambulatory Visit | Attending: Pulmonary Disease | Admitting: Pulmonary Disease

## 2013-11-26 ENCOUNTER — Other Ambulatory Visit (HOSPITAL_COMMUNITY): Payer: Self-pay | Admitting: Pulmonary Disease

## 2013-11-26 DIAGNOSIS — M25511 Pain in right shoulder: Secondary | ICD-10-CM

## 2013-11-26 DIAGNOSIS — M25519 Pain in unspecified shoulder: Secondary | ICD-10-CM | POA: Insufficient documentation

## 2013-11-26 DIAGNOSIS — M259 Joint disorder, unspecified: Secondary | ICD-10-CM | POA: Insufficient documentation

## 2013-11-29 ENCOUNTER — Other Ambulatory Visit (HOSPITAL_COMMUNITY): Payer: Self-pay | Admitting: Pulmonary Disease

## 2013-11-29 DIAGNOSIS — R52 Pain, unspecified: Secondary | ICD-10-CM

## 2013-12-05 ENCOUNTER — Encounter (HOSPITAL_COMMUNITY)
Admission: RE | Admit: 2013-12-05 | Discharge: 2013-12-05 | Disposition: A | Discharge status: Home / Self Care | Payer: Medicare Other | Source: Ambulatory Visit | Attending: Pulmonary Disease | Admitting: Pulmonary Disease

## 2013-12-05 ENCOUNTER — Other Ambulatory Visit (HOSPITAL_COMMUNITY): Payer: Self-pay | Admitting: Pulmonary Disease

## 2013-12-05 ENCOUNTER — Encounter (HOSPITAL_COMMUNITY): Payer: Self-pay

## 2013-12-05 DIAGNOSIS — R52 Pain, unspecified: Secondary | ICD-10-CM

## 2013-12-05 MED ORDER — TECHNETIUM TC 99M MEDRONATE IV KIT
25.0000 | PACK | Freq: Once | INTRAVENOUS | Status: AC | PRN
  Administered 2013-12-05: 25 via INTRAVENOUS

## 2014-08-20 ENCOUNTER — Encounter (HOSPITAL_COMMUNITY): Payer: Self-pay | Admitting: Emergency Medicine

## 2014-08-20 ENCOUNTER — Emergency Department (HOSPITAL_COMMUNITY): Payer: No Typology Code available for payment source

## 2014-08-20 ENCOUNTER — Emergency Department (HOSPITAL_COMMUNITY)
Admission: EM | Admit: 2014-08-20 | Discharge: 2014-08-20 | Disposition: A | Discharge status: Home / Self Care | Payer: No Typology Code available for payment source | Attending: Emergency Medicine | Admitting: Emergency Medicine

## 2014-08-20 DIAGNOSIS — S199XXA Unspecified injury of neck, initial encounter: Secondary | ICD-10-CM | POA: Diagnosis not present

## 2014-08-20 DIAGNOSIS — Z79899 Other long term (current) drug therapy: Secondary | ICD-10-CM | POA: Insufficient documentation

## 2014-08-20 DIAGNOSIS — G8929 Other chronic pain: Secondary | ICD-10-CM | POA: Diagnosis not present

## 2014-08-20 DIAGNOSIS — Z8719 Personal history of other diseases of the digestive system: Secondary | ICD-10-CM | POA: Diagnosis not present

## 2014-08-20 DIAGNOSIS — Z91040 Latex allergy status: Secondary | ICD-10-CM | POA: Diagnosis not present

## 2014-08-20 DIAGNOSIS — M545 Low back pain, unspecified: Secondary | ICD-10-CM

## 2014-08-20 DIAGNOSIS — Z8619 Personal history of other infectious and parasitic diseases: Secondary | ICD-10-CM | POA: Insufficient documentation

## 2014-08-20 DIAGNOSIS — Y92410 Unspecified street and highway as the place of occurrence of the external cause: Secondary | ICD-10-CM | POA: Insufficient documentation

## 2014-08-20 DIAGNOSIS — F419 Anxiety disorder, unspecified: Secondary | ICD-10-CM | POA: Insufficient documentation

## 2014-08-20 DIAGNOSIS — Y9389 Activity, other specified: Secondary | ICD-10-CM | POA: Diagnosis not present

## 2014-08-20 DIAGNOSIS — M542 Cervicalgia: Secondary | ICD-10-CM

## 2014-08-20 DIAGNOSIS — S3992XA Unspecified injury of lower back, initial encounter: Secondary | ICD-10-CM | POA: Insufficient documentation

## 2014-08-20 DIAGNOSIS — Z791 Long term (current) use of non-steroidal anti-inflammatories (NSAID): Secondary | ICD-10-CM | POA: Insufficient documentation

## 2014-08-20 DIAGNOSIS — Z720 Tobacco use: Secondary | ICD-10-CM | POA: Insufficient documentation

## 2014-08-20 MED ORDER — KETOROLAC TROMETHAMINE 60 MG/2ML IM SOLN
30.0000 mg | Freq: Once | INTRAMUSCULAR | Status: AC
  Administered 2014-08-20: 30 mg via INTRAMUSCULAR
  Filled 2014-08-20: qty 2
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
## 2014-08-24 ENCOUNTER — Emergency Department (HOSPITAL_COMMUNITY)
Admission: EM | Admit: 2014-08-24 | Discharge: 2014-08-24 | Disposition: A | Discharge status: Home / Self Care | Payer: No Typology Code available for payment source | Attending: Emergency Medicine | Admitting: Emergency Medicine

## 2014-08-24 ENCOUNTER — Encounter (HOSPITAL_COMMUNITY): Payer: Self-pay | Admitting: Emergency Medicine

## 2014-08-24 DIAGNOSIS — Z91040 Latex allergy status: Secondary | ICD-10-CM | POA: Diagnosis not present

## 2014-08-24 DIAGNOSIS — G8929 Other chronic pain: Secondary | ICD-10-CM | POA: Diagnosis not present

## 2014-08-24 DIAGNOSIS — S161XXD Strain of muscle, fascia and tendon at neck level, subsequent encounter: Secondary | ICD-10-CM | POA: Diagnosis not present

## 2014-08-24 DIAGNOSIS — Z720 Tobacco use: Secondary | ICD-10-CM | POA: Diagnosis not present

## 2014-08-24 DIAGNOSIS — M542 Cervicalgia: Secondary | ICD-10-CM | POA: Diagnosis not present

## 2014-08-24 DIAGNOSIS — Z8619 Personal history of other infectious and parasitic diseases: Secondary | ICD-10-CM | POA: Diagnosis not present

## 2014-08-24 DIAGNOSIS — K029 Dental caries, unspecified: Secondary | ICD-10-CM | POA: Insufficient documentation

## 2014-08-24 DIAGNOSIS — Z79899 Other long term (current) drug therapy: Secondary | ICD-10-CM | POA: Diagnosis not present

## 2014-08-24 DIAGNOSIS — F419 Anxiety disorder, unspecified: Secondary | ICD-10-CM | POA: Diagnosis not present

## 2014-08-24 DIAGNOSIS — Z8719 Personal history of other diseases of the digestive system: Secondary | ICD-10-CM | POA: Diagnosis not present

## 2014-08-24 DIAGNOSIS — M549 Dorsalgia, unspecified: Secondary | ICD-10-CM | POA: Diagnosis present

## 2014-08-24 DIAGNOSIS — Z791 Long term (current) use of non-steroidal anti-inflammatories (NSAID): Secondary | ICD-10-CM | POA: Diagnosis not present

## 2014-08-24 MED ORDER — CYCLOBENZAPRINE HCL 10 MG PO TABS: 10.0000 mg | ORAL_TABLET | Freq: Two times a day (BID) | ORAL | Status: DC | PRN
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
## 2014-08-24 NOTE — ED Notes (Signed)
Patient with no complaints at this time. Respirations even and unlabored. Skin warm/dry. Discharge instructions reviewed with patient at this time. Patient given opportunity to voice concerns/ask questions. Patient discharged at this time and left Emergency Department with steady gait.   
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
## 2014-08-27 NOTE — ED Provider Notes (Signed)
Medical screening examination/treatment/procedure(s) were performed by non-physician practitioner and as supervising physician I was immediately available for consultation/collaboration.   EKG Interpretation None        Mariea Clonts, MD 08/27/14 2344

## 2015-12-14 ENCOUNTER — Encounter (HOSPITAL_COMMUNITY): Payer: Self-pay | Admitting: Emergency Medicine

## 2015-12-14 DIAGNOSIS — G8929 Other chronic pain: Secondary | ICD-10-CM | POA: Insufficient documentation

## 2015-12-14 DIAGNOSIS — Z79899 Other long term (current) drug therapy: Secondary | ICD-10-CM | POA: Diagnosis not present

## 2015-12-14 DIAGNOSIS — Z791 Long term (current) use of non-steroidal anti-inflammatories (NSAID): Secondary | ICD-10-CM | POA: Diagnosis not present

## 2015-12-14 DIAGNOSIS — F17210 Nicotine dependence, cigarettes, uncomplicated: Secondary | ICD-10-CM | POA: Insufficient documentation

## 2015-12-14 DIAGNOSIS — Z91040 Latex allergy status: Secondary | ICD-10-CM | POA: Diagnosis not present

## 2015-12-14 DIAGNOSIS — Z8619 Personal history of other infectious and parasitic diseases: Secondary | ICD-10-CM | POA: Diagnosis not present

## 2015-12-14 DIAGNOSIS — F419 Anxiety disorder, unspecified: Secondary | ICD-10-CM | POA: Diagnosis not present

## 2015-12-14 DIAGNOSIS — Z8719 Personal history of other diseases of the digestive system: Secondary | ICD-10-CM | POA: Insufficient documentation

## 2015-12-14 DIAGNOSIS — F1123 Opioid dependence with withdrawal: Secondary | ICD-10-CM | POA: Diagnosis not present

## 2015-12-14 DIAGNOSIS — R110 Nausea: Secondary | ICD-10-CM | POA: Diagnosis present

## 2015-12-14 NOTE — ED Notes (Signed)
Pt c/o pain all over and states he is a week short on pain medication.

## 2015-12-15 ENCOUNTER — Emergency Department (HOSPITAL_COMMUNITY)
Admission: EM | Admit: 2015-12-15 | Discharge: 2015-12-15 | Disposition: A | Discharge status: Home / Self Care | Payer: Medicare HMO | Attending: Emergency Medicine | Admitting: Emergency Medicine

## 2015-12-15 DIAGNOSIS — F1123 Opioid dependence with withdrawal: Secondary | ICD-10-CM | POA: Diagnosis not present

## 2015-12-15 DIAGNOSIS — F1193 Opioid use, unspecified with withdrawal: Secondary | ICD-10-CM

## 2015-12-15 MED ORDER — LOPERAMIDE HCL 2 MG PO CAPS: 2.0000 mg | ORAL_CAPSULE | ORAL | Status: DC | PRN

## 2015-12-15 MED ORDER — METHOCARBAMOL 500 MG PO TABS
1000.0000 mg | ORAL_TABLET | Freq: Four times a day (QID) | ORAL | Status: DC | PRN
  Administered 2015-12-15: 1000 mg via ORAL
  Filled 2015-12-15: qty 2

## 2015-12-15 MED ORDER — CLONIDINE HCL 0.1 MG PO TABS: 0.1000 mg | ORAL_TABLET | Freq: Every day | ORAL | Status: DC

## 2015-12-15 MED ORDER — DICYCLOMINE HCL 20 MG PO TABS: 20.0000 mg | ORAL_TABLET | Freq: Four times a day (QID) | ORAL | Status: DC | PRN

## 2015-12-15 MED ORDER — ONDANSETRON HCL 4 MG PO TABS: 4.0000 mg | ORAL_TABLET | Freq: Three times a day (TID) | ORAL | Status: DC | PRN

## 2015-12-15 MED ORDER — CLONIDINE HCL 0.1 MG PO TABS
0.1000 mg | ORAL_TABLET | Freq: Four times a day (QID) | ORAL | Status: DC
  Administered 2015-12-15: 0.1 mg via ORAL
  Filled 2015-12-15: qty 1

## 2015-12-15 MED ORDER — CLONIDINE HCL 0.1 MG PO TABS: 0.1000 mg | ORAL_TABLET | ORAL | Status: DC

## 2015-12-15 MED ORDER — ONDANSETRON 4 MG PO TBDP: 4.0000 mg | ORAL_TABLET | Freq: Four times a day (QID) | ORAL | Status: DC | PRN

## 2015-12-15 MED ORDER — DICYCLOMINE HCL 10 MG PO CAPS
20.0000 mg | ORAL_CAPSULE | Freq: Four times a day (QID) | ORAL | Status: DC | PRN
  Administered 2015-12-15: 20 mg via ORAL
  Filled 2015-12-15: qty 2

## 2015-12-15 MED ORDER — HYDROXYZINE HCL 25 MG PO TABS
50.0000 mg | ORAL_TABLET | Freq: Four times a day (QID) | ORAL | Status: DC | PRN
  Administered 2015-12-15: 50 mg via ORAL
  Filled 2015-12-15: qty 2

## 2015-12-15 MED ORDER — METHOCARBAMOL 500 MG PO TABS: ORAL_TABLET | ORAL | Status: DC

## 2015-12-15 NOTE — ED Provider Notes (Signed)
CSN: [id number]     Arrival date & time 12/14/15  2159 History   First MD Initiated Contact with Patient 12/15/15 0205   Chief Complaint  Patient presents with  . Muscle Pain     (Consider location/radiation/quality/duration/timing/severity/associated sxs/prior Treatment) HPI patient reports he has been on chronic pain medication for about 7 or 8 years after having a fracture of his neck in 2006. He states he ran out early on Friday, February 3. He took his last dose that evening. The following morning he started feeling stiff and having increasing burning of his feet and arms, he's had nausea without vomiting or diarrhea. He states his abdomen feels tight.  PCP Dr. Luan Pulling  Past Medical History  Diagnosis Date  . Chronic pain     neck  . Chest pain   . Tobacco abuse   . Anxiety   . Genital herpes   . 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 Constipation    Past Surgical History  Procedure Laterality Date  . Neck surgery      fracture C3-7  . Cholecystectomy  1982  . Mastoidectomy  1991  . Chordoma  1996  . Colonoscopy  2001    pt reports [facility]  . Colonoscopy  05/30/2012    Procedure: COLONOSCOPY;  Surgeon: Daneil Dolin, MD;  Location: [facility];  Service: Endoscopy;  Laterality: N/A;  10:45   Family History  Problem Relation Age of Onset  . Hypertension Mother   . Heart disease Father   . Heart disease Sister   . Lung cancer Father   . Lung cancer Brother    Social History  Substance Use Topics  . Smoking status: Current Every Day Smoker -- 1.50 packs/day for 44 years    Types: Cigarettes  . Smokeless tobacco: None  . Alcohol Use: Yes     Comment: quit about 4 years ago   lives with spouse  lives at home     Review of Systems  All other systems reviewed and are negative.     Allergies  Celebrex; Ciprofloxacin; Contrast media; Dilaudid; Latex; Morphine and related; and Sulfa antibiotics  Home  Medications   Prior to Admission medications   Medication Sig Start Date End Date Taking? Authorizing Provider  acetaminophen (TYLENOL) 500 MG tablet Take 500 mg by mouth every 6 (six) hours as needed. For pain    Historical Provider, MD  ALPRAZolam Duanne Moron) 1 MG tablet Take 1 mg by mouth 4 (four) times daily as needed. For nerves     Historical Provider, MD  cyclobenzaprine (FLEXERIL) 10 MG tablet Take 1 tablet (10 mg total) by mouth 2 (two) times daily as needed for muscle spasms. 08/24/14   Hyde, NP  dicyclomine (BENTYL) 20 MG tablet Take 1 tablet (20 mg total) by mouth 4 (four) times daily as needed (abdominal spasms). 12/15/15   Rolland Porter, MD  HYDROcodone-acetaminophen (NORCO) 10-325 MG per tablet Take 1 tablet by mouth 4 (four) times daily.    Historical Provider, MD  meloxicam (MOBIC) 7.5 MG tablet Take 7.5 mg by mouth daily. 07/10/14   Historical Provider, MD  methocarbamol (ROBAXIN) 500 MG tablet Take 1 or 2 po Q 6hrs for muscle sorenesss 12/15/15   Rolland Porter, MD  ondansetron (ZOFRAN) 4 MG tablet Take 1 tablet (4 mg total) by mouth every 8 (eight) hours as needed for nausea or vomiting. 12/15/15   Rolland Porter, MD  valACYclovir (VALTREX) 500 MG tablet Take 500 mg by mouth 2 (two) times daily as needed. For breakout    Historical Provider, MD   BP 124/79 mmHg  Pulse 80  Temp(Src) 98.5 F (36.9 C) (Temporal)  Resp 14  Ht 5' 10.5" (1.791 m)  Wt 145 lb (65.772 kg)  BMI 20.50 kg/m2  SpO2 98%  Vital signs normal   Physical Exam  Constitutional: He is oriented to person, place, and time. He appears well-developed and well-nourished.  Non-toxic appearance. He does not appear ill. No distress.  HENT:  Head: Normocephalic and atraumatic.  Right Ear: External ear normal.  Left Ear: External ear normal.  Nose: Nose normal. No mucosal edema or rhinorrhea.  Mouth/Throat: Oropharynx is clear and moist and mucous membranes are normal. No dental abscesses or uvula swelling.  Eyes: Conjunctivae  and EOM are normal. Pupils are equal, round, and reactive to light.  Neck: Normal range of motion and full passive range of motion without pain. Neck supple.  Cardiovascular: Normal rate, regular rhythm and normal heart sounds.  Exam reveals no gallop and no friction rub.   No murmur heard. Pulmonary/Chest: Effort normal and breath sounds normal. No respiratory distress. He has no wheezes. He has no rhonchi. He has no rales. He exhibits no tenderness and no crepitus.  Abdominal: Soft. Normal appearance and bowel sounds are normal. He exhibits no distension. There is no tenderness. There is no rebound and no guarding.  Musculoskeletal: Normal range of motion. He exhibits no edema or tenderness.  Moves all extremities well.   Neurological: He is alert and oriented to person, place, and time. He has normal strength. No cranial nerve deficit.  Skin: Skin is warm, dry and intact. No rash noted. No erythema. No pallor.  Patient has some hyperpigmentation of the dorsum of his forearms, he is also noted to have nicotine staining of these fingers  Psychiatric: He has a normal mood and affect. His speech is normal and behavior is normal. His mood appears not anxious.  Nursing note and vitals reviewed.   ED Course  Procedures (including critical care time)  Medications  dicyclomine (BENTYL) capsule 20 mg (20 mg Oral Given 12/15/15 0422)  hydrOXYzine (ATARAX/VISTARIL) tablet 50 mg (50 mg Oral Given 12/15/15 0422)  loperamide (IMODIUM) capsule 2-4 mg (not administered)  methocarbamol (ROBAXIN) tablet 1,000 mg (1,000 mg Oral Given 12/15/15 0326)  ondansetron (ZOFRAN-ODT) disintegrating tablet 4 mg (not administered)  cloNIDine (CATAPRES) tablet 0.1 mg (0.1 mg Oral Given 12/15/15 0233)    Followed by  cloNIDine (CATAPRES) tablet 0.1 mg (not administered)    Followed by  cloNIDine (CATAPRES) tablet 0.1 mg (not administered)    I have explained to the patient that the ED does not write for your chronic pain  medication if you've run out early or lose your prescriptions. I can give him some medications for his withdrawal symptoms. Patient is agreeable. Patient was given clonidine and Robaxin.  Recheck at 4 AM patient still complains of discomfort in his legs however on further talking this is a chronic problem that he is on disability for, he was given Bentyl for some continued abdominal discomfort and hydroxyzine.   Review of the [place] shows patient gets #120 hydrocodone 10/325 monthly, they were last filled on January 18. He also gets #120 alprazolam 1 mg tablets last filled on January 11. These are all prescribed by his PCP.  MDM   Final diagnoses:  Narcotic withdrawal ([place])    New  Prescriptions   DICYCLOMINE (BENTYL) 20 MG TABLET    Take 1 tablet (20 mg total) by mouth 4 (four) times daily as needed (abdominal spasms).   METHOCARBAMOL (ROBAXIN) 500 MG TABLET    Take 1 or 2 po Q 6hrs for muscle sorenesss   ONDANSETRON (ZOFRAN) 4 MG TABLET    Take 1 tablet (4 mg total) by mouth every 8 (eight) hours as needed for nausea or vomiting.    Plan discharge  Rolland Porter, MD, Barbette Or, MD 12/15/15 [phone]

## 2015-12-15 NOTE — Discharge Instructions (Signed)
You will need to contact Dr Kathaleen Grinder office today to discuss getting more of your pain medications. The ED does not provide you with more pain medications if you run out of your prescription too early.    Opioid Withdrawal Opioids are a group of narcotic drugs. They include the street drug heroin. They also include pain medicines, such as morphine, hydrocodone, oxycodone, and fentanyl. Opioid withdrawal is a group of characteristic physical and mental signs and symptoms. It typically occurs if you have been using opioids daily for several weeks or longer and stop using or rapidly decrease use. Opioid withdrawal can also occur if you have used opioids daily for a long time and are given a medicine to block the effect.  SIGNS AND SYMPTOMS Opioid withdrawal includes three or more of the following symptoms:   Depressed, anxious, or irritable mood.  Nausea or vomiting.  Muscle aches or spasms.   Watery eyes.   Runny nose.  Dilated pupils, sweating, or hairs standing on end.  Diarrhea or intestinal cramping.  Yawning.   Fever.  Increased blood pressure.  Fast pulse.  Restlessness or trouble sleeping. These signs and symptoms occur within several hours of stopping or reducing short-acting opioids, such as heroin. They can occur within 3 days of stopping or reducing long-acting opioids, such as methadone. Withdrawal begins within minutes of receiving a drug that blocks the effects of opioids, such as naltrexone or naloxone. DIAGNOSIS  Opioid use disorder is diagnosed by your health care provider. You will be asked about your symptoms, drug and alcohol use, medical history, and use of medicines. A physical exam may be done. Lab tests may be ordered. Your health care provider may have you see a mental health professional.  TREATMENT  The treatment for opioid withdrawal is usually provided by medical doctors with special training in substance use disorders (addiction specialists). The  following medicines may be included in treatment:  Opioids given in place of the abused opioid. They turn on opioid receptors in the brain and lessen or prevent withdrawal symptoms. They are gradually decreased (opioid substitution and taper).  Non-opioids that can lessen certain opioid withdrawal symptoms. They may be used alone or with opioid substitution and taper. Successful long-term recovery usually requires medicine, counseling, and group support. HOME CARE INSTRUCTIONS   Take medicines only as directed by your health care provider.  Check with your health care provider before starting new medicines.  Keep all follow-up visits as directed by your health care provider. SEEK MEDICAL CARE IF:  You are not able to take your medicines as directed.  Your symptoms get worse.  You relapse. SEEK IMMEDIATE MEDICAL CARE IF:  You have serious thoughts about hurting yourself or others.  You have a seizure.  You lose consciousness.   This information is not intended to replace advice given to you by your health care provider. Make sure you discuss any questions you have with your health care provider.   Document Released: 10/27/2003 Document Revised: 11/14/2014 Document Reviewed: 11/06/2013 Elsevier Interactive Patient Education 2016 [place].   Chronic Pain Discharge Instructions  Emergency care providers appreciate that many patients coming to [place] are in severe pain and we wish to address their pain in the safest, most responsible manner.  It is important to recognize however, that the proper treatment of chronic pain differs from that of the pain of injuries and acute illnesses.  Our goal is to provide quality, safe, personalized care and we thank you for giving [place]  the opportunity to serve you. The use of narcotics and related agents for chronic pain syndromes may lead to additional physical and psychological problems.  Nearly as many people die from prescription narcotics each  year as die from car crashes.  Additionally, this risk is increased if such prescriptions are obtained from a variety of sources.  Therefore, only your primary care physician or a pain management specialist is able to safely treat such syndromes with narcotic medications long-term.    Documentation revealing such prescriptions have been sought from multiple sources may prohibit [place] from providing a refill or different narcotic medication.  Your name may be checked first through the [place].  This database is a record of controlled substance medication prescriptions that the patient has received.  This has been established by [facility] in an effort to eliminate the dangerous, and often life threatening, practice of obtaining multiple prescriptions from different medical providers.   If you have a chronic pain syndrome (i.e. chronic headaches, recurrent back or neck pain, dental pain, abdominal or pelvis pain without a specific diagnosis, or neuropathic pain such as fibromyalgia) or recurrent visits for the same condition without an acute diagnosis, you may be treated with non-narcotics and other non-addictive medicines.  Allergic reactions or negative side effects that may be reported by a patient to such medications will not typically lead to the use of a narcotic analgesic or other controlled substance as an alternative.   Patients managing chronic pain with a personal physician should have provisions in place for breakthrough pain.  If you are in crisis, you should call your physician.  If your physician directs you to the emergency department, please have the doctor call and speak to our attending physician concerning your care.   When patients come to the Emergency Department (ED) with acute medical conditions in which the Emergency Department physician feels appropriate to prescribe narcotic or sedating pain medication, the physician will prescribe these  in very limited quantities.  The amount of these medications will last only until you can see your primary care physician in his/her office.  Any patient who returns to the ED seeking refills should expect only non-narcotic pain medications.   In the event of an acute medical condition exists and the emergency physician feels it is necessary that the patient be given a narcotic or sedating medication -  a responsible adult driver should be present in the room prior to the medication being given by the nurse.   Prescriptions for narcotic or sedating medications that have been lost, stolen or expired will not be refilled in the Emergency Department.    Patients who have chronic pain may receive non-narcotic prescriptions until seen by their primary care physician.  It is every patients personal responsibility to maintain active prescriptions with his or her primary care physician or specialist.

## 2015-12-20 ENCOUNTER — Encounter (HOSPITAL_COMMUNITY): Payer: Self-pay | Admitting: Emergency Medicine

## 2015-12-20 ENCOUNTER — Emergency Department (HOSPITAL_COMMUNITY)
Admission: EM | Admit: 2015-12-20 | Discharge: 2015-12-21 | Disposition: A | Discharge status: Home / Self Care | Payer: Medicare HMO | Attending: Emergency Medicine | Admitting: Emergency Medicine

## 2015-12-20 DIAGNOSIS — F17210 Nicotine dependence, cigarettes, uncomplicated: Secondary | ICD-10-CM | POA: Insufficient documentation

## 2015-12-20 DIAGNOSIS — Z791 Long term (current) use of non-steroidal anti-inflammatories (NSAID): Secondary | ICD-10-CM | POA: Insufficient documentation

## 2015-12-20 DIAGNOSIS — I951 Orthostatic hypotension: Secondary | ICD-10-CM | POA: Diagnosis not present

## 2015-12-20 DIAGNOSIS — R55 Syncope and collapse: Secondary | ICD-10-CM | POA: Diagnosis present

## 2015-12-20 DIAGNOSIS — J069 Acute upper respiratory infection, unspecified: Secondary | ICD-10-CM

## 2015-12-20 DIAGNOSIS — Z8719 Personal history of other diseases of the digestive system: Secondary | ICD-10-CM | POA: Insufficient documentation

## 2015-12-20 DIAGNOSIS — G8929 Other chronic pain: Secondary | ICD-10-CM | POA: Insufficient documentation

## 2015-12-20 DIAGNOSIS — Z91040 Latex allergy status: Secondary | ICD-10-CM | POA: Diagnosis not present

## 2015-12-20 DIAGNOSIS — Z8619 Personal history of other infectious and parasitic diseases: Secondary | ICD-10-CM | POA: Diagnosis not present

## 2015-12-20 DIAGNOSIS — Z79899 Other long term (current) drug therapy: Secondary | ICD-10-CM | POA: Insufficient documentation

## 2015-12-20 DIAGNOSIS — F419 Anxiety disorder, unspecified: Secondary | ICD-10-CM | POA: Insufficient documentation

## 2015-12-20 NOTE — ED Notes (Signed)
Pt brought in by EMS after wife called and stated pt fell. Wife stated that she caught pt and he never hit the ground. Per EMS, VS stable.

## 2015-12-21 ENCOUNTER — Emergency Department (HOSPITAL_COMMUNITY): Payer: Medicare HMO

## 2015-12-21 DIAGNOSIS — I951 Orthostatic hypotension: Secondary | ICD-10-CM | POA: Diagnosis not present

## 2015-12-21 LAB — COMPREHENSIVE METABOLIC PANEL
ALT: 19 U/L (ref 17–63)
AST: 22 U/L (ref 15–41)
Albumin: 3.9 g/dL (ref 3.5–5.0)
Alkaline Phosphatase: 64 U/L (ref 38–126)
Anion gap: 10 (ref 5–15)
BUN: 7 mg/dL (ref 6–20)
CALCIUM: 8.5 mg/dL — AB (ref 8.9–10.3)
CHLORIDE: 92 mmol/L — AB (ref 101–111)
CO2: 26 mmol/L (ref 22–32)
CREATININE: 0.65 mg/dL (ref 0.61–1.24)
GFR calc Af Amer: >60 mL/min (ref >60)
GLUCOSE: 107 mg/dL — AB (ref 65–99)
Potassium: 3.4 mmol/L — ABNORMAL LOW (ref 3.5–5.1)
SODIUM: 128 mmol/L — AB (ref 135–145)
Total Bilirubin: 0.5 mg/dL (ref 0.3–1.2)
Total Protein: 7.1 g/dL (ref 6.5–8.1)

## 2015-12-21 LAB — CBC WITH DIFFERENTIAL/PLATELET
BASOS ABS: 0 10*3/uL (ref 0.0–0.1)
Basophils Relative: 0 %
EOS PCT: 0 %
Eosinophils Absolute: 0 10*3/uL (ref 0.0–0.7)
HCT: 38.5 % — ABNORMAL LOW (ref 39.0–52.0)
Hemoglobin: 13.5 g/dL (ref 13.0–17.0)
LYMPHS PCT: 11 %
Lymphs Abs: 1.1 10*3/uL (ref 0.7–4.0)
MCH: 33 pg (ref 26.0–34.0)
MCHC: 35.1 g/dL (ref 30.0–36.0)
MCV: 94.1 fL (ref 78.0–100.0)
Monocytes Absolute: 0.9 10*3/uL (ref 0.1–1.0)
Monocytes Relative: 9 %
Neutro Abs: 8 10*3/uL — ABNORMAL HIGH (ref 1.7–7.7)
Neutrophils Relative %: 80 %
PLATELETS: 135 10*3/uL — AB (ref 150–400)
RBC: 4.09 MIL/uL — AB (ref 4.22–5.81)
RDW: 12.5 % (ref 11.5–15.5)
WBC: 10 10*3/uL (ref 4.0–10.5)

## 2015-12-21 LAB — URINE MICROSCOPIC-ADD ON

## 2015-12-21 LAB — URINALYSIS, ROUTINE W REFLEX MICROSCOPIC
BILIRUBIN URINE: NEGATIVE
Glucose, UA: NEGATIVE mg/dL
Ketones, ur: 15 mg/dL — AB
Leukocytes, UA: NEGATIVE
Nitrite: NEGATIVE
PH: 7 (ref 5.0–8.0)
Protein, ur: NEGATIVE mg/dL
SPECIFIC GRAVITY, URINE: 1.01 (ref 1.005–1.030)

## 2015-12-21 LAB — TROPONIN I: Troponin I: <0.03 ng/mL (ref <0.031)

## 2015-12-21 LAB — I-STAT CG4 LACTIC ACID, ED: Lactic Acid, Venous: 0.99 mmol/L (ref 0.5–2.0)

## 2015-12-21 MED ORDER — SODIUM CHLORIDE 0.9 % IV BOLUS (SEPSIS)
1000.0000 mL | Freq: Once | INTRAVENOUS | Status: AC
  Administered 2015-12-21: 1000 mL via INTRAVENOUS

## 2015-12-21 MED ORDER — ACETAMINOPHEN 500 MG PO TABS
1000.0000 mg | ORAL_TABLET | Freq: Once | ORAL | Status: AC
  Administered 2015-12-21: 1000 mg via ORAL
  Filled 2015-12-21: qty 2

## 2015-12-21 NOTE — ED Provider Notes (Addendum)
By signing my name below, I, Doran Stabler, attest that this documentation has been prepared under the direction and in the presence of [employer], DO. Electronically Signed: Doran Stabler, ED Scribe. 12/21/2015. 12:26 AM.  TIME SEEN: 12:22 AM  CHIEF COMPLAINT:  Chief Complaint  Patient presents with  . Fall    HPI: HPI Comments: Jaime [name] here via EMS is a 60 y.o. male who presents to the Emergency Department for an evaluation s/p syncopal event prior to arrival. Pt spouse states the pt "passed out" in the bathroom and was then eased to the ground. Pt reports prior to passing out, he was felt like he was burning up.  Wife denies any seizure activity but states he was incontinent of urine. No postictal period. Denies preceding headache, numbness, tingling or focal weakness. Pt reports that he had lightheadedness with standing. He has had this before. Has had many syncopal events before. Denies any preceding chest pain or shortness of breath. Does report several days of recent productive cough with yellow sputum and subjective fever.  Pt denies any nausea, vomiting, diarrhea, appetite changes, bloody stools or melena or any other sx at this time. Pt denies any PMHx of seizure, HTN, DM, HLD, heart failure, PE or DVT. Wife states that she was able to ease him to the ground and he did not hit his head. He denies any injury. Pt denies being on any anticoagulants. Pt received his flu vaccination this year. Pt is a smoker. Patient's family has similar symptoms of fever and cough.  Pt is followed by HAWKINS,EDWARD L, MD.   ROS: See HPI Constitutional: Subjective fever  Eyes: no drainage  ENT: no runny nose   Cardiovascular: no chest pain  Resp: no SOB  GI: no vomiting GU: no dysuria Integumentary: no rash  Allergy: no hives  Musculoskeletal: no leg swelling  Neurological: no slurred speech ROS otherwise negative  PAST MEDICAL HISTORY/PAST SURGICAL HISTORY:  Past Medical History   Diagnosis Date  . Chronic pain     neck  . Chest pain   . Tobacco abuse   . Anxiety   . Genital herpes   . 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 Constipation     MEDICATIONS:  Prior to Admission medications   Medication Sig Start Date End Date Taking? Authorizing Provider  acetaminophen (TYLENOL) 500 MG tablet Take 500 mg by mouth every 6 (six) hours as needed. For pain   Yes Historical Provider, MD  ALPRAZolam Duanne Moron) 1 MG tablet Take 1 mg by mouth 4 (four) times daily as needed. For nerves    Yes Historical Provider, MD  DULoxetine (CYMBALTA) 60 MG capsule Take 60 mg by mouth daily.   Yes Historical Provider, MD  HYDROcodone-acetaminophen (NORCO) 10-325 MG per tablet Take 1 tablet by mouth 4 (four) times daily.   Yes Historical Provider, MD  meloxicam (MOBIC) 7.5 MG tablet Take 7.5 mg by mouth daily. 07/10/14  Yes Historical Provider, MD  cyclobenzaprine (FLEXERIL) 10 MG tablet Take 1 tablet (10 mg total) by mouth 2 (two) times daily as needed for muscle spasms. 08/24/14   Joiner, NP  dicyclomine (BENTYL) 20 MG tablet Take 1 tablet (20 mg total) by mouth 4 (four) times daily as needed (abdominal spasms). 12/15/15   Rolland Porter, MD  methocarbamol (ROBAXIN) 500 MG tablet Take 1 or 2 po Q 6hrs for muscle sorenesss 12/15/15   Rolland Porter, MD  ondansetron (  ZOFRAN) 4 MG tablet Take 1 tablet (4 mg total) by mouth every 8 (eight) hours as needed for nausea or vomiting. 12/15/15   Rolland Porter, MD  valACYclovir (VALTREX) 500 MG tablet Take 500 mg by mouth 2 (two) times daily as needed. For breakout    Historical Provider, MD    ALLERGIES:  Allergies  Allergen Reactions  . Celebrex [Celecoxib] Itching    Hot Flashes  . Ciprofloxacin Hives  . Contrast Media [Iodinated Diagnostic Agents]     Hypotension  . Dilaudid [Hydromorphone] Other (See Comments)    hallunications  . Latex Hives  . Morphine And Related     Panic Attack  . Sulfa Antibiotics Other  (See Comments)    Hot Flashes    SOCIAL HISTORY:  Social History  Substance Use Topics  . Smoking status: Current Every Day Smoker -- 1.50 packs/day for 44 years    Types: Cigarettes  . Smokeless tobacco: Not on file  . Alcohol Use: Yes     Comment: quit about 4 years ago    FAMILY HISTORY: Family History  Problem Relation Age of Onset  . Hypertension Mother   . Heart disease Father   . Heart disease Sister   . Lung cancer Father   . Lung cancer Brother     EXAM: BP 116/79 mmHg  Pulse 105  Temp(Src) 99.4 F (37.4 C) (Oral)  Resp 20  Ht 5\' 11"  (1.803 m)  Wt 150 lb (68.04 kg)  BMI 20.93 kg/m2  SpO2 97%   CONSTITUTIONAL: Alert and oriented and responds appropriately to questions. Well-appearing; well-nourished; GCS 15 HEAD: Normocephalic; atraumatic EYES: Conjunctivae clear, PERRL, EOMI ENT: normal nose; no rhinorrhea; moist mucous membranes; pharynx without lesions noted; no dental injury; no hemotypanum; no septal hematoma NECK: Supple, no meningismus, no LAD; no midline spinal tenderness, step-off or deformity CARD: RRR; S1 and S2 appreciated; no murmurs, no clicks, no rubs, no gallops RESP: Normal chest excursion without splinting or tachypnea; breath sounds clear and equal bilaterally; no wheezes, no rhonchi, no rales; chest wall stable, nontender to palpation, no hypoxia or respiratory distress, speaking full sentences ABD/GI: Normal bowel sounds; non-distended; soft, non-tender, no rebound, no guarding PELVIS:  stable, nontender to palpation BACK:  The back appears normal and is non-tender to palpation, there is no CVA tenderness; no midline spinal tenderness, step-off or deformity EXT: Normal ROM in all joints; non-tender to palpation; no edema; normal capillary refill; no cyanosis    SKIN: Normal color for age and race; warm, no rash NEURO: Moves all extremities equally, sensation to light touch intact diffusely, cranial nerves II through XII intact PSYCH: The  patient's mood and manner are appropriate. Grooming and personal hygiene are appropriate.  MEDICAL DECISION MAKING: Patient here with complaints of a syncopal event after standing up too to the bathroom. He has had syncopal events from standing upright in the past. He reports subjective fever the past several days and cough with yellow sputum production. Family members with similar symptoms. Oral temperature here is 99.4 and he is otherwise hemodynamically stable. Neurologically intact. Denies any preceding or current chest pain or shortness of breath. No history of PE or DVT. No history of CAD. We'll obtain labs including troponin, chest x-ray and urine. Will give IV fluids and Tylenol. Suspect viral illness, possible influenza. Suspect orthostasis. He has no history of CHF. No neurologic deficits. No history of arrhythmia.  ED PROGRESS: 2:00 AM  Patient is orthostatic. Labs show no leukocytosis but he does  have a left shift. Sodium slightly low at 128. Troponin negative. Chest x-ray clear. We'll continue IV hydration. Urinalysis pending.  3:15 AM  Pt reports feeling much better with IV hydration. Urine shows blood and rare bacteria but no other sign of infection. He does have a mild amount of ketones. Will reassess blood pressure after third liter of IV fluids.   4:30 AM  Pt reports feeling much better after 3 L of IV fluids. Now able to stand without being dizzy. He states that he has a history of orthostatic hypotension and on review of his records he does appear he has had blood pressures in the 90s/50s back to 2013. Heart rate is in the 70s. He is not on blood pressure medication. Have advised him to increase his water intake at home. I suspect that he is a viral upper respiratory infection. Doubt pneumonia. He feels comfortable with plan to be discharged home with outpatient follow-up. Patient's wife will take him home. Discussed return precautions. He verbalizes understanding and is comfortable with  this plan.     EKG Interpretation  Date/Time:  Monday December 21 2015 01:03:37 EST Ventricular Rate:  87 PR Interval:  163 QRS Duration: 96 QT Interval:  363 QTC Calculation: 437 R Axis:   88 Text Interpretation:  Sinus rhythm Borderline right axis deviation Nonspecific T abnormalities, lateral leads No significant change since last tracing in 2013 Confirmed by WARD,  DO, KRISTEN [phone]) on 12/21/2015 1:40:49 AM        I personally performed the services described in this documentation, which was scribed in my presence. The recorded information has been reviewed and is accurate.    [place], DO 12/21/15 [phone]

## 2015-12-21 NOTE — Discharge Instructions (Signed)
Orthostatic Hypotension Orthostatic hypotension is a sudden drop in blood pressure. It happens when you quickly stand up from a seated or lying position. You may feel dizzy or light-headed. This can last for just a few seconds or for up to a few minutes. It is usually not a serious problem. However, if this happens frequently or gets worse, it can be a sign of something more serious. CAUSES  Different things can cause orthostatic hypotension, including:   Loss of body fluids (dehydration).  Medicines that lower blood pressure.  Sudden changes in posture, such as standing up quickly after you have been sitting or lying down.  Taking too much of your medicine. SIGNS AND SYMPTOMS   Light-headedness or dizziness.   Fainting or near-fainting.   A fast heart rate.   Weakness.   Feeling tired (fatigue).  DIAGNOSIS  Your health care provider may do several things to help diagnose your condition and identify the cause. These may include:   Taking a medical history and doing a physical exam.  Checking your blood pressure. Your health care provider will check your blood pressure when you are:  Lying down.  Sitting.  Standing.  Using tilt table testing. In this test, you lie down on a table that moves from a lying position to a standing position. You will be strapped onto the table. This test monitors your blood pressure and heart rate when you are in different positions. TREATMENT  Treatment will vary depending on the cause. Possible treatments include:   Changing the dosage of your medicines.  Wearing compression stockings on your lower legs.  Standing up slowly after sitting or lying down.  Eating more salt.  Eating frequent, small meals.  In some cases, getting IV fluids.  Taking medicine to enhance fluid retention. HOME CARE INSTRUCTIONS  Only take over-the-counter or prescription medicines as directed by your health care provider.  Follow your health care  provider's instructions for changing the dosage of your current medicines.  Do not stop or adjust your medicine on your own.  Stand up slowly after sitting or lying down. This allows your body to adjust to the different position.  Wear compression stockings as directed.  Eat extra salt as directed.  Do not add extra salt to your diet unless directed to by your health care provider.  Eat frequent, small meals.  Avoid standing suddenly after eating.  Avoid hot showers or excessive heat as directed by your health care provider.  Keep all follow-up appointments. SEEK MEDICAL CARE IF:  You continue to feel dizzy or light-headed after standing.  You feel groggy or confused.  You feel cold, clammy, or sick to your stomach (nauseous).  You have blurred vision.  You feel short of breath. SEEK IMMEDIATE MEDICAL CARE IF:   You faint after standing.  You have chest pain.  You have difficulty breathing.   You lose feeling or movement in your arms or legs.   You have slurred speech or difficulty talking, or you are unable to talk.  MAKE SURE YOU:   Understand these instructions.  Will watch your condition.  Will get help right away if you are not doing well or get worse.   This information is not intended to replace advice given to you by your health care provider. Make sure you discuss any questions you have with your health care provider.   Document Released: 10/14/2002 Document Revised: 10/29/2013 Document Reviewed: 08/16/2013 Elsevier Interactive Patient Education 2016 Elsevier Inc.  Syncope Syncope  is a medical term for fainting or passing out. This means you lose consciousness and drop to the ground. People are generally unconscious for less than 5 minutes. You may have some muscle twitches for up to 15 seconds before waking up and returning to normal. Syncope occurs more often in older adults, but it can happen to anyone. While most causes of syncope are not  dangerous, syncope can be a sign of a serious medical problem. It is important to seek medical care.  CAUSES  Syncope is caused by a sudden drop in blood flow to the brain. The specific cause is often not determined. Factors that can bring on syncope include:  Taking medicines that lower blood pressure.  Sudden changes in posture, such as standing up quickly.  Taking more medicine than prescribed.  Standing in one place for too long.  Seizure disorders.  Dehydration and excessive exposure to heat.  Low blood sugar (hypoglycemia).  Straining to have a bowel movement.  Heart disease, irregular heartbeat, or other circulatory problems.  Fear, emotional distress, seeing blood, or severe pain. SYMPTOMS  Right before fainting, you may:  Feel dizzy or light-headed.  Feel nauseous.  See all white or all black in your field of vision.  Have cold, clammy skin. DIAGNOSIS  Your health care provider will ask about your symptoms, perform a physical exam, and perform an electrocardiogram (ECG) to record the electrical activity of your heart. Your health care provider may also perform other heart or blood tests to determine the cause of your syncope which may include:  Transthoracic echocardiogram (TTE). During echocardiography, sound waves are used to evaluate how blood flows through your heart.  Transesophageal echocardiogram (TEE).  Cardiac monitoring. This allows your health care provider to monitor your heart rate and rhythm in real time.  Holter monitor. This is a portable device that records your heartbeat and can help diagnose heart arrhythmias. It allows your health care provider to track your heart activity for several days, if needed.  Stress tests by exercise or by giving medicine that makes the heart beat faster. TREATMENT  In most cases, no treatment is needed. Depending on the cause of your syncope, your health care provider may recommend changing or stopping some of your  medicines. HOME CARE INSTRUCTIONS  Have someone stay with you until you feel stable.  Do not drive, use machinery, or play sports until your health care provider says it is okay.  Keep all follow-up appointments as directed by your health care provider.  Lie down right away if you start feeling like you might faint. Breathe deeply and steadily. Wait until all the symptoms have passed.  Drink enough fluids to keep your urine clear or pale yellow.  If you are taking blood pressure or heart medicine, get up slowly and take several minutes to sit and then stand. This can reduce dizziness. SEEK IMMEDIATE MEDICAL CARE IF:   You have a severe headache.  You have unusual pain in the chest, abdomen, or back.  You are bleeding from your mouth or rectum, or you have black or tarry stool.  You have an irregular or very fast heartbeat.  You have pain with breathing.  You have repeated fainting or seizure-like jerking during an episode.  You faint when sitting or lying down.  You have confusion.  You have trouble walking.  You have severe weakness.  You have vision problems. If you fainted, call your local emergency services (911 in [place] Do not drive yourself  to the hospital.    This information is not intended to replace advice given to you by your health care provider. Make sure you discuss any questions you have with your health care provider.   Document Released: 10/24/2005 Document Revised: 03/10/2015 Document Reviewed: 12/23/2011 Elsevier Interactive Patient Education 2016 Elsevier Inc.  Upper Respiratory Infection, Adult Most upper respiratory infections (URIs) are a viral infection of the air passages leading to the lungs. A URI affects the nose, throat, and upper air passages. The most common type of URI is nasopharyngitis and is typically referred to as "the common cold." URIs run their course and usually go away on their own. Most of the time, a URI does not require  medical attention, but sometimes a bacterial infection in the upper airways can follow a viral infection. This is called a secondary infection. Sinus and middle ear infections are common types of secondary upper respiratory infections. Bacterial pneumonia can also complicate a URI. A URI can worsen asthma and chronic obstructive pulmonary disease (COPD). Sometimes, these complications can require emergency medical care and may be life threatening.  CAUSES Almost all URIs are caused by viruses. A virus is a type of germ and can spread from one person to another.  RISKS FACTORS You may be at risk for a URI if:   You smoke.   You have chronic heart or lung disease.  You have a weakened defense (immune) system.   You are very young or very old.   You have nasal allergies or asthma.  You work in crowded or poorly ventilated areas.  You work in health care facilities or schools. SIGNS AND SYMPTOMS  Symptoms typically develop 2-3 days after you come in contact with a cold virus. Most viral URIs last 7-10 days. However, viral URIs from the influenza virus (flu virus) can last 14-18 days and are typically more severe. Symptoms may include:   Runny or stuffy (congested) nose.   Sneezing.   Cough.   Sore throat.   Headache.   Fatigue.   Fever.   Loss of appetite.   Pain in your forehead, behind your eyes, and over your cheekbones (sinus pain).  Muscle aches.  DIAGNOSIS  Your health care provider may diagnose a URI by:  Physical exam.  Tests to check that your symptoms are not due to another condition such as:  Strep throat.  Sinusitis.  Pneumonia.  Asthma. TREATMENT  A URI goes away on its own with time. It cannot be cured with medicines, but medicines may be prescribed or recommended to relieve symptoms. Medicines may help:  Reduce your fever.  Reduce your cough.  Relieve nasal congestion. HOME CARE INSTRUCTIONS   Take medicines only as directed by  your health care provider.   Gargle warm saltwater or take cough drops to comfort your throat as directed by your health care provider.  Use a warm mist humidifier or inhale steam from a shower to increase air moisture. This may make it easier to breathe.  Drink enough fluid to keep your urine clear or pale yellow.   Eat soups and other clear broths and maintain good nutrition.   Rest as needed.   Return to work when your temperature has returned to normal or as your health care provider advises. You may need to stay home longer to avoid infecting others. You can also use a face mask and careful hand washing to prevent spread of the virus.  Increase the usage of your inhaler if you  have asthma.   Do not use any tobacco products, including cigarettes, chewing tobacco, or electronic cigarettes. If you need help quitting, ask your health care provider. PREVENTION  The best way to protect yourself from getting a cold is to practice good hygiene.   Avoid oral or hand contact with people with cold symptoms.   Wash your hands often if contact occurs.  There is no clear evidence that vitamin C, vitamin E, echinacea, or exercise reduces the chance of developing a cold. However, it is always recommended to get plenty of rest, exercise, and practice good nutrition.  SEEK MEDICAL CARE IF:   You are getting worse rather than better.   Your symptoms are not controlled by medicine.   You have chills.  You have worsening shortness of breath.  You have brown or red mucus.  You have yellow or brown nasal discharge.  You have pain in your face, especially when you bend forward.  You have a fever.  You have swollen neck glands.  You have pain while swallowing.  You have white areas in the back of your throat. SEEK IMMEDIATE MEDICAL CARE IF:   You have severe or persistent:  Headache.  Ear pain.  Sinus pain.  Chest pain.  You have chronic lung disease and any of the  following:  Wheezing.  Prolonged cough.  Coughing up blood.  A change in your usual mucus.  You have a stiff neck.  You have changes in your:  Vision.  Hearing.  Thinking.  Mood. MAKE SURE YOU:   Understand these instructions.  Will watch your condition.  Will get help right away if you are not doing well or get worse.   This information is not intended to replace advice given to you by your health care provider. Make sure you discuss any questions you have with your health care provider.   Document Released: 04/19/2001 Document Revised: 03/10/2015 Document Reviewed: 01/29/2014 Elsevier Interactive Patient Education [employer].

## 2015-12-21 NOTE — ED Notes (Signed)
BP after 3L of fluids was 91/68 and HR was 97.

## 2016-03-27 ENCOUNTER — Encounter (HOSPITAL_COMMUNITY): Payer: Self-pay

## 2016-03-27 ENCOUNTER — Emergency Department (HOSPITAL_COMMUNITY)
Admission: EM | Admit: 2016-03-27 | Discharge: 2016-03-27 | Disposition: A | Discharge status: Home / Self Care | Payer: Commercial Managed Care - HMO | Attending: Emergency Medicine | Admitting: Emergency Medicine

## 2016-03-27 DIAGNOSIS — F17210 Nicotine dependence, cigarettes, uncomplicated: Secondary | ICD-10-CM | POA: Insufficient documentation

## 2016-03-27 DIAGNOSIS — Z760 Encounter for issue of repeat prescription: Secondary | ICD-10-CM | POA: Diagnosis present

## 2016-03-27 DIAGNOSIS — F419 Anxiety disorder, unspecified: Secondary | ICD-10-CM | POA: Insufficient documentation

## 2016-03-27 DIAGNOSIS — R112 Nausea with vomiting, unspecified: Secondary | ICD-10-CM | POA: Insufficient documentation

## 2016-03-27 MED ORDER — LORAZEPAM 1 MG PO TABS: 1.0000 mg | ORAL_TABLET | Freq: Three times a day (TID) | ORAL | Status: DC | PRN

## 2016-03-27 MED ORDER — LORAZEPAM 1 MG PO TABS
1.0000 mg | ORAL_TABLET | Freq: Once | ORAL | Status: AC
  Administered 2016-03-27: 1 mg via ORAL
  Filled 2016-03-27: qty 1

## 2016-03-27 NOTE — ED Provider Notes (Signed)
CSN: [id number]     Arrival date & time 03/27/16  1351 History  By signing my name below, I, Jaime [name], attest that this documentation has been prepared under the direction and in the presence of non-physician practitioner, Ladona Rosten, PA-C. Electronically Signed: [name], Scribe. 03/27/2016. 2:23 PM.   Chief Complaint  Patient presents with  . Medication Refill    The history is provided by the patient. No language interpreter was used.     HPI Comments: Jaime [name] is a 60 y.o. male brought in by EMS who presents to the Emergency Department requesting Xanax 1 mg medication refill. Pt reports that he takes Xanax for anxiety and management of his chronic neck pain. Pt states that he has not had Xanax for the last 2 days; he takes 4 a day. He states that his live in girlfriend who may be stealing his Xanax but also endorses that he may have taken more than his daily allotment "a couple of times." Pt notes exacerbation of his chronic neck pain, neck stiffness, as well as generalized pain and stiffness since running out of Xanax. Pt also endorses generalized, nausea, and vomiting x2; he vomited once yesterday and once prior to evaluation today. He endorses nausea with food/fluid intake for the last 2 days. Pt states that he does not drink alcohol. He is not on blood thinners. He denies syncope, seizure, diarrhea, or any other associated symptoms. His PCP is Dr. Luan Pulling.  Past Medical History  Diagnosis Date  . Chronic pain     neck  . Chest pain   . Tobacco abuse   . Anxiety   . Genital herpes   . 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 Constipation    Past Surgical History  Procedure Laterality Date  . Neck surgery      fracture C3-7  . Cholecystectomy  1982  . Mastoidectomy  1991  . Chordoma  1996  . Colonoscopy  2001    pt reports normal at [facility]  . Colonoscopy  05/30/2012    Procedure: COLONOSCOPY;  Surgeon: Daneil Dolin,  MD;  Location: [facility];  Service: Endoscopy;  Laterality: N/A;  10:45   Family History  Problem Relation Age of Onset  . Hypertension Mother   . Heart disease Father   . Heart disease Sister   . Lung cancer Father   . Lung cancer Brother    Social History  Substance Use Topics  . Smoking status: Current Every Day Smoker -- 1.50 packs/day for 44 years    Types: Cigarettes  . Smokeless tobacco: None  . Alcohol Use: Yes     Comment: quit about 4 years ago    Review of Systems  Constitutional: Negative for fever and chills.  Respiratory: Negative for shortness of breath.   Cardiovascular: Negative for chest pain.  Gastrointestinal: Positive for nausea and vomiting. Negative for abdominal pain and diarrhea.  Genitourinary: Negative for dysuria and difficulty urinating.  Musculoskeletal: Positive for myalgias and arthralgias.  Skin: Negative for color change and wound.  Neurological: Negative for dizziness, seizures and syncope.  Psychiatric/Behavioral: The patient is nervous/anxious.   All other systems reviewed and are negative.  Allergies  Celebrex; Ciprofloxacin; Contrast media; Dilaudid; Latex; Morphine and related; and Sulfa antibiotics  Home Medications   Prior to Admission medications   Medication Sig Start Date End Date Taking? Authorizing Provider  acetaminophen (TYLENOL) 500 MG tablet Take 500 mg by  mouth every 6 (six) hours as needed. For pain    Historical Provider, MD  ALPRAZolam Duanne Moron) 1 MG tablet Take 1 mg by mouth 4 (four) times daily as needed. For nerves     Historical Provider, MD  cyclobenzaprine (FLEXERIL) 10 MG tablet Take 1 tablet (10 mg total) by mouth 2 (two) times daily as needed for muscle spasms. 08/24/14   [place], NP  dicyclomine (BENTYL) 20 MG tablet Take 1 tablet (20 mg total) by mouth 4 (four) times daily as needed (abdominal spasms). 12/15/15   Rolland Porter, MD  DULoxetine (CYMBALTA) 60 MG capsule Take 60 mg by mouth daily.    Historical  Provider, MD  HYDROcodone-acetaminophen (NORCO) 10-325 MG per tablet Take 1 tablet by mouth 4 (four) times daily.    Historical Provider, MD  meloxicam (MOBIC) 7.5 MG tablet Take 7.5 mg by mouth daily. 07/10/14   Historical Provider, MD  methocarbamol (ROBAXIN) 500 MG tablet Take 1 or 2 po Q 6hrs for muscle sorenesss 12/15/15   Rolland Porter, MD  ondansetron (ZOFRAN) 4 MG tablet Take 1 tablet (4 mg total) by mouth every 8 (eight) hours as needed for nausea or vomiting. 12/15/15   Rolland Porter, MD  valACYclovir (VALTREX) 500 MG tablet Take 500 mg by mouth 2 (two) times daily as needed. For breakout    Historical Provider, MD   BP 159/80 mmHg  Pulse 88  Temp(Src) 97.7 F (36.5 C) (Oral)  Resp 20  Ht 5\' 9"  (1.753 m)  Wt 142 lb (64.411 kg)  BMI 20.96 kg/m2  SpO2 100% Physical Exam  Constitutional: He is oriented to person, place, and time. He appears well-developed and well-nourished. No distress.  HENT:  Head: Normocephalic and atraumatic.  Mouth/Throat: Oropharynx is clear and moist.  Eyes: Conjunctivae and EOM are normal.  Pupils slightly dilated bilaterally, equally Reactive to light.  Neck: Normal range of motion. Neck supple. No tracheal deviation present.  Cardiovascular: Normal rate and regular rhythm.   Pulmonary/Chest: Effort normal. No respiratory distress. He exhibits no tenderness.  Abdominal: Soft. He exhibits no distension. There is no tenderness.  Musculoskeletal: Normal range of motion.  Neurological: He is alert and oriented to person, place, and time.  Skin: Skin is warm and dry. No rash noted.  Psychiatric: His behavior is normal. Thought content normal.  Appears somewhat anxious.  Nursing note and vitals reviewed.  ED Course  Procedures (including critical care time)  DIAGNOSTIC STUDIES: Oxygen Saturation is 100% on RA, normal by my interpretation.    COORDINATION OF CARE: 2:23 PM Discussed treatment plan with pt at bedside and pt agreed to plan.  Labs Review Labs  Reviewed - No data to display  Imaging Review No results found. I have personally reviewed and evaluated these images and lab results as part of my medical decision-making.   EKG Interpretation None      MDM  2:22 PM Reviewed patient on [place] narcotics database. He received a prescription for a quantity of 120 Xanax 1 mg filled on 4/30 along with quantity of 120 Hydrocodone 10 mg on 4/27.  2:43 PM Discussed with patient that he needs to arrange follow up with his PCP tomorrow regarding further medication.   Final diagnoses:  Anxiety   Pt with hx of chronic pain and anxiety.  Long term use of xanax reported.  Mildly anxious appearing with mild sx's of withdrawal. No reported seizures.  Pt understands that minimal quantity of ativan prescribed until pt can f/u with  his PMD on Monday.    I personally performed the services described in this documentation, which was scribed in my presence. The recorded information has been reviewed and is accurate.    Bufford Lope 03/29/16 2145  Daleen Bo, MD 03/30/16 1220

## 2016-03-27 NOTE — ED Notes (Addendum)
Pt states that his xanax is locked up at home but states he thinks someone is getting a couple of them even though they are locked up per pt.  States he takes xanax for pain control.  States that he has been vomiting several times today.  But denied to triage nurse.  Pt got here by [facility] and pt states is unable to ambulate without xanax.

## 2016-03-27 NOTE — ED Notes (Signed)
When discharging pt, informed pt about making a police report if he suspects someone stealing his medications and that it is a felony.  Pt stated that he had x 2 in the past made police reports concerning missing medication which is conflicting to what pt told the PA today.  He stated to PA earlier that this was the first time it had happened.

## 2016-03-27 NOTE — Discharge Instructions (Signed)
Panic Attacks °Panic attacks are sudden, short feelings of great fear or discomfort. You may have them for no reason when you are relaxed, when you are uneasy (anxious), or when you are sleeping.  °HOME CARE °· Take all your medicines as told. °· Check with your doctor before starting new medicines. °· Keep all doctor visits. °GET HELP IF: °· You are not able to take your medicines as told. °· Your symptoms do not get better. °· Your symptoms get worse. °GET HELP RIGHT AWAY IF: °· Your attacks seem different than your normal attacks. °· You have thoughts about hurting yourself or others. °· You take panic attack medicine and you have a side effect. °MAKE SURE YOU: °· Understand these instructions. °· Will watch your condition. °· Will get help right away if you are not doing well or get worse. °  °This information is not intended to replace advice given to you by your health care provider. Make sure you discuss any questions you have with your health care provider. °  °Document Released: 11/26/2010 Document Revised: 08/14/2013 Document Reviewed: 06/07/2013 °Elsevier Interactive Patient Education ©2016 Elsevier Inc. ° °

## 2016-03-27 NOTE — ED Notes (Signed)
Patient ran out of his 30 day supply of Xanax 6 days early. C/o increased anxiety. Denies sweating, denies NVD

## 2016-05-26 DIAGNOSIS — Z79891 Long term (current) use of opiate analgesic: Secondary | ICD-10-CM | POA: Diagnosis not present

## 2016-05-26 DIAGNOSIS — M545 Low back pain: Secondary | ICD-10-CM | POA: Diagnosis not present

## 2016-05-26 DIAGNOSIS — S199XXD Unspecified injury of neck, subsequent encounter: Secondary | ICD-10-CM | POA: Diagnosis not present

## 2016-05-26 DIAGNOSIS — J449 Chronic obstructive pulmonary disease, unspecified: Secondary | ICD-10-CM | POA: Diagnosis not present

## 2016-05-26 DIAGNOSIS — M542 Cervicalgia: Secondary | ICD-10-CM | POA: Diagnosis not present

## 2016-10-04 ENCOUNTER — Encounter (HOSPITAL_COMMUNITY): Payer: Self-pay | Admitting: Emergency Medicine

## 2016-10-04 ENCOUNTER — Emergency Department (HOSPITAL_COMMUNITY)
Admission: EM | Admit: 2016-10-04 | Discharge: 2016-10-04 | Disposition: A | Discharge status: Home / Self Care | Payer: Medicare Other | Attending: Emergency Medicine | Admitting: Emergency Medicine

## 2016-10-04 DIAGNOSIS — L299 Pruritus, unspecified: Secondary | ICD-10-CM | POA: Diagnosis present

## 2016-10-04 DIAGNOSIS — F17210 Nicotine dependence, cigarettes, uncomplicated: Secondary | ICD-10-CM | POA: Diagnosis not present

## 2016-10-04 DIAGNOSIS — R21 Rash and other nonspecific skin eruption: Secondary | ICD-10-CM

## 2016-10-04 DIAGNOSIS — Z79899 Other long term (current) drug therapy: Secondary | ICD-10-CM | POA: Insufficient documentation

## 2016-10-04 LAB — BASIC METABOLIC PANEL
ANION GAP: 7 (ref 5–15)
BUN: 6 mg/dL (ref 6–20)
CO2: 30 mmol/L (ref 22–32)
Calcium: 9.3 mg/dL (ref 8.9–10.3)
Chloride: 101 mmol/L (ref 101–111)
Creatinine, Ser: 0.62 mg/dL (ref 0.61–1.24)
GFR calc Af Amer: >60 mL/min (ref >60)
Glucose, Bld: 96 mg/dL (ref 65–99)
POTASSIUM: 3.8 mmol/L (ref 3.5–5.1)
SODIUM: 138 mmol/L (ref 135–145)

## 2016-10-04 LAB — CBC WITH DIFFERENTIAL/PLATELET
BASOS ABS: 0 10*3/uL (ref 0.0–0.1)
BASOS PCT: 0 %
EOS PCT: 3 %
Eosinophils Absolute: 0.3 10*3/uL (ref 0.0–0.7)
HCT: 42.7 % (ref 39.0–52.0)
Hemoglobin: 14.4 g/dL (ref 13.0–17.0)
LYMPHS PCT: 45 %
Lymphs Abs: 4 10*3/uL (ref 0.7–4.0)
MCH: 33.6 pg (ref 26.0–34.0)
MCHC: 33.7 g/dL (ref 30.0–36.0)
MCV: 99.5 fL (ref 78.0–100.0)
MONO ABS: 0.6 10*3/uL (ref 0.1–1.0)
Monocytes Relative: 7 %
Neutro Abs: 4.1 10*3/uL (ref 1.7–7.7)
Neutrophils Relative %: 45 %
PLATELETS: 197 10*3/uL (ref 150–400)
RBC: 4.29 MIL/uL (ref 4.22–5.81)
RDW: 13.7 % (ref 11.5–15.5)
WBC: 8.9 10*3/uL (ref 4.0–10.5)

## 2016-10-04 MED ORDER — HYDROXYZINE HCL 25 MG PO TABS: 25.0000 mg | ORAL_TABLET | Freq: Four times a day (QID) | ORAL | 0 refills | Status: DC | PRN

## 2016-10-04 MED ORDER — HYDROXYZINE HCL 25 MG PO TABS
25.0000 mg | ORAL_TABLET | Freq: Once | ORAL | Status: AC
  Administered 2016-10-04: 25 mg via ORAL
  Filled 2016-10-04: qty 1

## 2016-10-04 MED ORDER — PREDNISONE 10 MG PO TABS: ORAL_TABLET | ORAL | 0 refills | Status: DC

## 2016-10-04 NOTE — ED Notes (Signed)
Pt alert & oriented x4, stable gait. Patient given discharge instructions, paperwork & prescription(s). Patient  instructed to stop at the registration desk to finish any additional paperwork. Patient verbalized understanding. Pt left department w/ no further questions. 

## 2016-10-04 NOTE — ED Triage Notes (Signed)
Pt has hx of eczema, has developed a rash to his trunk, buttocks and groin. Pt has used prescription meds at home with no relief.

## 2016-10-08 NOTE — ED Provider Notes (Signed)
[facility] DEPT Provider Note   CSN: [id number] Arrival date & time: 10/04/16  1642     History   Chief Complaint Chief Complaint  Patient presents with  . Rash    HPI Jaime [name] is a 60 y.o. male with a history of intermittent eczema flares presenting with a pruritic rash on his abdomen extending into the pelvic region, lower back and buttocks present for the past 2 weeks which reminds him of an eczema flare.  He has used benadryl without significant improvement in itching and an otc hydrocortisone cream without improvement.  He denies fevers, chills, fever, nausea or pain, swelling or drainage at the site of the rash.  He does endorse increased easy bruising but denies spontaneous nosebleeds or other unexplained bleeding.  The history is provided by the patient and the spouse.    Past Medical History:  Diagnosis Date  . Anxiety   . Chest pain   . Chronic pain    neck  . Constipation   . Genital herpes   . 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 Tobacco abuse     Patient Active Problem List   Diagnosis Date Noted  . Chronic constipation 05/11/2012  . Chest pain, atypical 12/14/2011  . Tobacco abuse 12/14/2011    Past Surgical History:  Procedure Laterality Date  . CHOLECYSTECTOMY  1982  . chordoma  1996  . COLONOSCOPY  2001   pt reports normal at [facility]  . COLONOSCOPY  05/30/2012   Procedure: COLONOSCOPY;  Surgeon: Daneil Dolin, MD;  Location: [facility];  Service: Endoscopy;  Laterality: N/A;  10:45  . MASTOIDECTOMY  1991  . NECK SURGERY     fracture C3-7       Home Medications    Prior to Admission medications   Medication Sig Start Date End Date Taking? Authorizing Provider  acetaminophen (TYLENOL) 500 MG tablet Take 500 mg by mouth every 6 (six) hours as needed. For pain    Historical Provider, MD  ALPRAZolam Duanne Moron) 1 MG tablet Take 1 mg by mouth 4 (four) times daily as needed. For nerves      Historical Provider, MD  cyclobenzaprine (FLEXERIL) 10 MG tablet Take 1 tablet (10 mg total) by mouth 2 (two) times daily as needed for muscle spasms. 08/24/14   [place], NP  dicyclomine (BENTYL) 20 MG tablet Take 1 tablet (20 mg total) by mouth 4 (four) times daily as needed (abdominal spasms). 12/15/15   Rolland Porter, MD  DULoxetine (CYMBALTA) 60 MG capsule Take 60 mg by mouth daily.    Historical Provider, MD  HYDROcodone-acetaminophen (NORCO) 10-325 MG per tablet Take 1 tablet by mouth 4 (four) times daily.    Historical Provider, MD  hydrOXYzine (ATARAX/VISTARIL) 25 MG tablet Take 1 tablet (25 mg total) by mouth every 6 (six) hours as needed for itching. 10/04/16   Evalee Jefferson, PA-C  LORazepam (ATIVAN) 1 MG tablet Take 1 tablet (1 mg total) by mouth 3 (three) times daily as needed for anxiety. 03/27/16   Tammy Triplett, PA-C  meloxicam (MOBIC) 7.5 MG tablet Take 7.5 mg by mouth daily. 07/10/14   Historical Provider, MD  methocarbamol (ROBAXIN) 500 MG tablet Take 1 or 2 po Q 6hrs for muscle sorenesss 12/15/15   Rolland Porter, MD  ondansetron (ZOFRAN) 4 MG tablet Take 1 tablet (4 mg total) by mouth every 8 (eight) hours as needed for nausea or vomiting. 12/15/15   Iva  Tomi Bamberger, MD  predniSONE (DELTASONE) 10 MG tablet 6, 5, 4, 3, 2 then 1 tablet by mouth daily for 6 days total. 10/04/16   Evalee Jefferson, PA-C  valACYclovir (VALTREX) 500 MG tablet Take 500 mg by mouth 2 (two) times daily as needed. For breakout    Historical Provider, MD    Family History Family History  Problem Relation Age of Onset  . Hypertension Mother   . Heart disease Father   . Lung cancer Father   . Heart disease Sister   . Lung cancer Brother     Social History Social History  Substance Use Topics  . Smoking status: Current Every Day Smoker    Packs/day: 1.00    Years: 44.00    Types: Cigarettes  . Smokeless tobacco: Never Used  . Alcohol use No     Comment: quit about 4 years ago     Allergies   Celebrex [celecoxib];  Ciprofloxacin; Contrast media [iodinated diagnostic agents]; Dilaudid [hydromorphone]; Latex; Morphine and related; and Sulfa antibiotics   Review of Systems Review of Systems  Constitutional: Negative for chills and fever.  Respiratory: Negative for shortness of breath and wheezing.   Skin: Positive for rash.  Neurological: Negative for numbness.     Physical Exam Updated Vital Signs BP 163/98   Pulse 65   Temp 98.3 F (36.8 C) (Oral)   Resp 20   Ht 5\' 10"  (1.778 m)   Wt 64 kg   SpO2 99%   BMI 20.23 kg/m   Physical Exam  Constitutional: He appears well-developed and well-nourished. No distress.  HENT:  Head: Normocephalic.  Neck: Neck supple.  Cardiovascular: Normal rate.   Pulmonary/Chest: Effort normal. He has no wheezes.  Musculoskeletal: Normal range of motion. He exhibits no edema.  Skin: Rash noted. Rash is papular.  Slightly raised patches of dry lesions abdomen, back and buttocks.  No drainage, no surrounding erythema.  Lesions are shaped variably, approximate 0.5 to cm.  No heralds patch, no vesicles or pustules.  Incidentally, patient has nonblanching short linear petechia, non blanching c/w the pattern of his sock rim bilateral ankles.  Skin on dorsal arms appears thinned with few ecchymoses.     ED Treatments / Results  Labs  Results for orders placed or performed during the hospital encounter of 10/04/16  CBC with Differential  Result Value Ref Range   WBC 8.9 4.0 - 10.5 K/uL   RBC 4.29 4.22 - 5.81 MIL/uL   Hemoglobin 14.4 13.0 - 17.0 g/dL   HCT 42.7 39.0 - 52.0 %   MCV 99.5 78.0 - 100.0 fL   MCH 33.6 26.0 - 34.0 pg   MCHC 33.7 30.0 - 36.0 g/dL   RDW 13.7 11.5 - 15.5 %   Platelets 197 150 - 400 K/uL   Neutrophils Relative % 45 %   Neutro Abs 4.1 1.7 - 7.7 K/uL   Lymphocytes Relative 45 %   Lymphs Abs 4.0 0.7 - 4.0 K/uL   Monocytes Relative 7 %   Monocytes Absolute 0.6 0.1 - 1.0 K/uL   Eosinophils Relative 3 %   Eosinophils Absolute 0.3 0.0 -  0.7 K/uL   Basophils Relative 0 %   Basophils Absolute 0.0 0.0 - 0.1 K/uL  Basic metabolic panel  Result Value Ref Range   Sodium 138 135 - 145 mmol/L   Potassium 3.8 3.5 - 5.1 mmol/L   Chloride 101 101 - 111 mmol/L   CO2 30 22 - 32 mmol/L   Glucose, Bld  96 65 - 99 mg/dL   BUN 6 6 - 20 mg/dL   Creatinine, Ser 0.62 0.61 - 1.24 mg/dL   Calcium 9.3 8.9 - 10.3 mg/dL   GFR calc non Af Amer >60 >60 mL/min   GFR calc Af Amer >60 >60 mL/min   Anion gap 7 5 - 15   No results found.   EKG  EKG Interpretation None       Radiology No results found.  Procedures Procedures (including critical care time)  Medications Ordered in ED Medications  hydrOXYzine (ATARAX/VISTARIL) tablet 25 mg (25 mg Oral Given 10/04/16 1929)     Initial Impression / Assessment and Plan / ED Course  I have reviewed the triage vital signs and the nursing notes.  Pertinent labs & imaging results that were available during my care of the patient were reviewed by me and considered in my medical decision making (see chart for details).  Clinical Course     Labs reviewed and stable.  Petechia of lower legs of unclear etiology. Blood work stable including platelet count.  Rash on torso c/w dermatitis, probably eczema.  He was placed on a prednisone taper, atarax in place of benadryl for itching.  Recheck by pcp if sx persist or worsen.   Final Clinical Impressions(s) / ED Diagnoses   Final diagnoses:  Rash    New Prescriptions Discharge Medication List as of 10/04/2016  8:52 PM    START taking these medications   Details  hydrOXYzine (ATARAX/VISTARIL) 25 MG tablet Take 1 tablet (25 mg total) by mouth every 6 (six) hours as needed for itching., Starting Tue 10/04/2016, Print    predniSONE (DELTASONE) 10 MG tablet 6, 5, 4, 3, 2 then 1 tablet by mouth daily for 6 days total., Print         Evalee Jefferson, PA-C 10/08/16 2143    Pattricia Boss, MD 10/10/16 1232

## 2016-12-05 ENCOUNTER — Emergency Department (HOSPITAL_COMMUNITY)
Admission: EM | Admit: 2016-12-05 | Discharge: 2016-12-05 | Disposition: A | Discharge status: Home / Self Care | Payer: Medicare Other | Attending: Emergency Medicine | Admitting: Emergency Medicine

## 2016-12-05 ENCOUNTER — Encounter (HOSPITAL_COMMUNITY): Payer: Self-pay | Admitting: Emergency Medicine

## 2016-12-05 DIAGNOSIS — F1123 Opioid dependence with withdrawal: Secondary | ICD-10-CM | POA: Insufficient documentation

## 2016-12-05 DIAGNOSIS — T424X1A Poisoning by benzodiazepines, accidental (unintentional), initial encounter: Secondary | ICD-10-CM | POA: Diagnosis not present

## 2016-12-05 DIAGNOSIS — Z7982 Long term (current) use of aspirin: Secondary | ICD-10-CM | POA: Insufficient documentation

## 2016-12-05 DIAGNOSIS — R197 Diarrhea, unspecified: Secondary | ICD-10-CM | POA: Diagnosis not present

## 2016-12-05 DIAGNOSIS — F17210 Nicotine dependence, cigarettes, uncomplicated: Secondary | ICD-10-CM | POA: Insufficient documentation

## 2016-12-05 DIAGNOSIS — F1193 Opioid use, unspecified with withdrawal: Secondary | ICD-10-CM

## 2016-12-05 DIAGNOSIS — R109 Unspecified abdominal pain: Secondary | ICD-10-CM | POA: Diagnosis present

## 2016-12-05 LAB — URINALYSIS, ROUTINE W REFLEX MICROSCOPIC
BACTERIA UA: NONE SEEN
Bilirubin Urine: NEGATIVE
Glucose, UA: NEGATIVE mg/dL
Ketones, ur: NEGATIVE mg/dL
Leukocytes, UA: NEGATIVE
Nitrite: NEGATIVE
PH: 6 (ref 5.0–8.0)
Protein, ur: NEGATIVE mg/dL
SPECIFIC GRAVITY, URINE: 1.002 — AB (ref 1.005–1.030)

## 2016-12-05 LAB — COMPREHENSIVE METABOLIC PANEL
ALBUMIN: 4 g/dL (ref 3.5–5.0)
ALK PHOS: 72 U/L (ref 38–126)
ALT: 16 U/L — ABNORMAL LOW (ref 17–63)
ANION GAP: 8 (ref 5–15)
AST: 18 U/L (ref 15–41)
BUN: 5 mg/dL — ABNORMAL LOW (ref 6–20)
CALCIUM: 9.3 mg/dL (ref 8.9–10.3)
CHLORIDE: 97 mmol/L — AB (ref 101–111)
CO2: 27 mmol/L (ref 22–32)
Creatinine, Ser: 0.59 mg/dL — ABNORMAL LOW (ref 0.61–1.24)
GFR calc Af Amer: >60 mL/min (ref >60)
GFR calc non Af Amer: >60 mL/min (ref >60)
Glucose, Bld: 104 mg/dL — ABNORMAL HIGH (ref 65–99)
POTASSIUM: 3.8 mmol/L (ref 3.5–5.1)
SODIUM: 132 mmol/L — AB (ref 135–145)
Total Bilirubin: 0.8 mg/dL (ref 0.3–1.2)
Total Protein: 7.4 g/dL (ref 6.5–8.1)

## 2016-12-05 LAB — CBC
HEMATOCRIT: 40 % (ref 39.0–52.0)
HEMOGLOBIN: 13.8 g/dL (ref 13.0–17.0)
MCH: 33.1 pg (ref 26.0–34.0)
MCHC: 34.5 g/dL (ref 30.0–36.0)
MCV: 95.9 fL (ref 78.0–100.0)
Platelets: 238 10*3/uL (ref 150–400)
RBC: 4.17 MIL/uL — ABNORMAL LOW (ref 4.22–5.81)
RDW: 12.9 % (ref 11.5–15.5)
WBC: 9 10*3/uL (ref 4.0–10.5)

## 2016-12-05 LAB — LIPASE, BLOOD: LIPASE: 14 U/L (ref 11–51)

## 2016-12-05 MED ORDER — DICYCLOMINE HCL 10 MG PO CAPS
10.0000 mg | ORAL_CAPSULE | Freq: Once | ORAL | Status: AC
  Administered 2016-12-05: 10 mg via ORAL
  Filled 2016-12-05: qty 1

## 2016-12-05 MED ORDER — ONDANSETRON HCL 4 MG/2ML IJ SOLN
4.0000 mg | Freq: Once | INTRAMUSCULAR | Status: AC
  Administered 2016-12-05: 4 mg via INTRAVENOUS
  Filled 2016-12-05: qty 2

## 2016-12-05 MED ORDER — LOPERAMIDE HCL 2 MG PO CAPS
4.0000 mg | ORAL_CAPSULE | Freq: Once | ORAL | Status: AC
  Administered 2016-12-05: 4 mg via ORAL
  Filled 2016-12-05: qty 2

## 2016-12-05 MED ORDER — DICYCLOMINE HCL 20 MG PO TABS: 20.0000 mg | ORAL_TABLET | Freq: Two times a day (BID) | ORAL | 0 refills | Status: DC

## 2016-12-05 MED ORDER — SODIUM CHLORIDE 0.9 % IV BOLUS (SEPSIS)
1000.0000 mL | Freq: Once | INTRAVENOUS | Status: AC
  Administered 2016-12-05: 1000 mL via INTRAVENOUS

## 2016-12-05 NOTE — ED Provider Notes (Signed)
[facility] DEPT Provider Note   CSN: [id number] Arrival date & time: 12/05/16  1130     History   Chief Complaint Chief Complaint  Patient presents with  . Diarrhea    HPI ATAVION REDSTONE is a 61 y.o. male.  Pt presents to the ED today with diarrhea since the 26th.  Pt takes hydrocodone and xanax chronically and has run out since the 26th.  The pt said that he can't get more until Feb. 1st. Pt also c/o abdominal cramping.      Past Medical History:  Diagnosis Date  . Anxiety   . Chest pain   . Chronic pain    neck  . Constipation   . Genital herpes   . 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 Tobacco abuse     Patient Active Problem List   Diagnosis Date Noted  . Chronic constipation 05/11/2012  . Chest pain, atypical 12/14/2011  . Tobacco abuse 12/14/2011    Past Surgical History:  Procedure Laterality Date  . CHOLECYSTECTOMY  1982  . chordoma  1996  . COLONOSCOPY  2001   pt reports normal at [facility]  . COLONOSCOPY  05/30/2012   Procedure: COLONOSCOPY;  Surgeon: Daneil Dolin, MD;  Location: [facility];  Service: Endoscopy;  Laterality: N/A;  10:45  . MASTOIDECTOMY  1991  . NECK SURGERY     fracture C3-7       Home Medications    Prior to Admission medications   Medication Sig Start Date End Date Taking? Authorizing Provider  acetaminophen (TYLENOL) 500 MG tablet Take 500 mg by mouth every 6 (six) hours as needed for mild pain or moderate pain.   Yes Historical Provider, MD  ALPRAZolam Duanne Moron) 1 MG tablet Take 1 mg by mouth 4 (four) times daily as needed. For nerves    Yes Historical Provider, MD  aspirin EC 81 MG tablet Take 81 mg by mouth daily.   Yes Historical Provider, MD  HYDROcodone-acetaminophen (NORCO) 10-325 MG per tablet Take 1 tablet by mouth 4 (four) times daily.   Yes Historical Provider, MD  polyethylene glycol powder (GLYCOLAX/MIRALAX) powder Take 17 g by mouth daily.   Yes Historical  Provider, MD  cyclobenzaprine (FLEXERIL) 10 MG tablet Take 1 tablet (10 mg total) by mouth 2 (two) times daily as needed for muscle spasms. 08/24/14   Hope Bunnie Pion, NP  dicyclomine (BENTYL) 20 MG tablet Take 1 tablet (20 mg total) by mouth 2 (two) times daily. 12/05/16   Isla Pence, MD  DULoxetine (CYMBALTA) 60 MG capsule Take 60 mg by mouth daily.    Historical Provider, MD  hydrOXYzine (ATARAX/VISTARIL) 25 MG tablet Take 1 tablet (25 mg total) by mouth every 6 (six) hours as needed for itching. 10/04/16   Evalee Jefferson, PA-C  meloxicam (MOBIC) 7.5 MG tablet Take 7.5 mg by mouth daily. 07/10/14   Historical Provider, MD  methocarbamol (ROBAXIN) 500 MG tablet Take 1 or 2 po Q 6hrs for muscle sorenesss Patient taking differently: Take 500-1,000 mg by mouth every 6 (six) hours as needed for muscle spasms.  12/15/15   Rolland Porter, MD  valACYclovir (VALTREX) 500 MG tablet Take 500 mg by mouth 2 (two) times daily as needed. For breakout    Historical Provider, MD    Family History Family History  Problem Relation Age of Onset  . Hypertension Mother   . Heart disease Father   . Lung cancer Father   .  Heart disease Sister   . Lung cancer Brother     Social History Social History  Substance Use Topics  . Smoking status: Current Every Day Smoker    Packs/day: 1.00    Years: 44.00    Types: Cigarettes  . Smokeless tobacco: Never Used  . Alcohol use No     Comment: quit about 4 years ago     Allergies   Celebrex [celecoxib]; Ciprofloxacin; Contrast media [iodinated diagnostic agents]; Dilaudid [hydromorphone]; Latex; Morphine and related; and Sulfa antibiotics   Review of Systems Review of Systems  Gastrointestinal: Positive for abdominal pain and diarrhea.  All other systems reviewed and are negative.    Physical Exam Updated Vital Signs BP 167/98   Pulse 70   Temp 98.7 F (37.1 C) (Oral)   Resp 18   Ht 5\' 10"  (1.778 m)   Wt 145 lb (65.8 kg)   SpO2 99%   BMI 20.81 kg/m    Physical Exam  Constitutional: He is oriented to person, place, and time. He appears well-developed and well-nourished.  HENT:  Head: Normocephalic and atraumatic.  Right Ear: External ear normal.  Left Ear: External ear normal.  Nose: Nose normal.  Mouth/Throat: Oropharynx is clear and moist.  Eyes: Conjunctivae and EOM are normal. Pupils are equal, round, and reactive to light.  Neck: Normal range of motion. Neck supple.  Cardiovascular: Normal rate, regular rhythm, normal heart sounds and intact distal pulses.   Pulmonary/Chest: Effort normal and breath sounds normal.  Abdominal: Soft. Bowel sounds are normal.  Musculoskeletal: Normal range of motion.  Neurological: He is alert and oriented to person, place, and time.  Skin: Skin is warm and dry.  Psychiatric: He has a normal mood and affect. His behavior is normal. Judgment and thought content normal.  Nursing note and vitals reviewed.    ED Treatments / Results  Labs (all labs ordered are listed, but only abnormal results are displayed) Labs Reviewed  COMPREHENSIVE METABOLIC PANEL - Abnormal; Notable for the following:       Result Value   Sodium 132 (*)    Chloride 97 (*)    Glucose, Bld 104 (*)    BUN 5 (*)    Creatinine, Ser 0.59 (*)    ALT 16 (*)    All other components within normal limits  CBC - Abnormal; Notable for the following:    RBC 4.17 (*)    All other components within normal limits  URINALYSIS, ROUTINE W REFLEX MICROSCOPIC - Abnormal; Notable for the following:    Color, Urine STRAW (*)    Specific Gravity, Urine 1.002 (*)    Hgb urine dipstick MODERATE (*)    All other components within normal limits  LIPASE, BLOOD    EKG  EKG Interpretation None       Radiology No results found.  Procedures Procedures (including critical care time)  Medications Ordered in ED Medications  sodium chloride 0.9 % bolus 1,000 mL (1,000 mLs Intravenous New Bag/Given 12/05/16 1607)  loperamide (IMODIUM)  capsule 4 mg (4 mg Oral Given 12/05/16 1607)  ondansetron (ZOFRAN) injection 4 mg (4 mg Intravenous Given 12/05/16 1607)  dicyclomine (BENTYL) capsule 10 mg (10 mg Oral Given 12/05/16 1607)     Initial Impression / Assessment and Plan / ED Course  I have reviewed the triage vital signs and the nursing notes.  Pertinent labs & imaging results that were available during my care of the patient were reviewed by me and considered in my  medical decision making (see chart for details).    Pt is feeling better after IVFs.  I told him that we would not be able to supply him with narcotics or benzos.  He knows to f/u with his pcp for his chronic pain management.      Final Clinical Impressions(s) / ED Diagnoses   Final diagnoses:  Opiate withdrawal ([place])  Benzodiazepine overdose, accidental or unintentional, initial encounter  Diarrhea, unspecified type    New Prescriptions New Prescriptions   DICYCLOMINE (BENTYL) 20 MG TABLET    Take 1 tablet (20 mg total) by mouth 2 (two) times daily.     Isla Pence, MD 12/05/16 1719

## 2016-12-05 NOTE — ED Triage Notes (Signed)
Pt states running out of Hydrocodone and Xanax Friday. Unable to see primary. Pt states having diarrhea, cold sweats.

## 2016-12-05 NOTE — Discharge Instructions (Signed)
Otc imodium as needed for diarrhea °

## 2016-12-12 DIAGNOSIS — J449 Chronic obstructive pulmonary disease, unspecified: Secondary | ICD-10-CM | POA: Diagnosis not present

## 2016-12-12 DIAGNOSIS — L502 Urticaria due to cold and heat: Secondary | ICD-10-CM | POA: Diagnosis not present

## 2016-12-12 DIAGNOSIS — N401 Enlarged prostate with lower urinary tract symptoms: Secondary | ICD-10-CM | POA: Diagnosis not present

## 2016-12-12 DIAGNOSIS — G8250 Quadriplegia, unspecified: Secondary | ICD-10-CM | POA: Diagnosis not present

## 2016-12-31 ENCOUNTER — Encounter (HOSPITAL_COMMUNITY): Payer: Self-pay | Admitting: Emergency Medicine

## 2016-12-31 ENCOUNTER — Emergency Department (HOSPITAL_COMMUNITY)
Admission: EM | Admit: 2016-12-31 | Discharge: 2016-12-31 | Disposition: A | Discharge status: Home / Self Care | Payer: Medicare Other | Attending: Emergency Medicine | Admitting: Emergency Medicine

## 2016-12-31 DIAGNOSIS — Z7982 Long term (current) use of aspirin: Secondary | ICD-10-CM | POA: Diagnosis not present

## 2016-12-31 DIAGNOSIS — R21 Rash and other nonspecific skin eruption: Secondary | ICD-10-CM | POA: Diagnosis not present

## 2016-12-31 DIAGNOSIS — F17210 Nicotine dependence, cigarettes, uncomplicated: Secondary | ICD-10-CM | POA: Diagnosis not present

## 2016-12-31 DIAGNOSIS — Z79899 Other long term (current) drug therapy: Secondary | ICD-10-CM | POA: Insufficient documentation

## 2016-12-31 DIAGNOSIS — R51 Headache: Secondary | ICD-10-CM | POA: Diagnosis not present

## 2016-12-31 LAB — CBC WITH DIFFERENTIAL/PLATELET
BASOS ABS: 0.1 10*3/uL (ref 0.0–0.1)
BASOS PCT: 1 %
EOS ABS: 0.4 10*3/uL (ref 0.0–0.7)
Eosinophils Relative: 5 %
HEMATOCRIT: 40.4 % (ref 39.0–52.0)
HEMOGLOBIN: 13.6 g/dL (ref 13.0–17.0)
Lymphocytes Relative: 34 %
Lymphs Abs: 2.7 10*3/uL (ref 0.7–4.0)
MCH: 33.3 pg (ref 26.0–34.0)
MCHC: 33.7 g/dL (ref 30.0–36.0)
MCV: 98.8 fL (ref 78.0–100.0)
MONOS PCT: 7 %
Monocytes Absolute: 0.6 10*3/uL (ref 0.1–1.0)
NEUTROS ABS: 4.3 10*3/uL (ref 1.7–7.7)
NEUTROS PCT: 53 %
Platelets: 223 10*3/uL (ref 150–400)
RBC: 4.09 MIL/uL — AB (ref 4.22–5.81)
RDW: 13.3 % (ref 11.5–15.5)
WBC: 8 10*3/uL (ref 4.0–10.5)

## 2016-12-31 MED ORDER — PREDNISONE 20 MG PO TABS: 40.0000 mg | ORAL_TABLET | Freq: Every day | ORAL | 0 refills | Status: DC

## 2016-12-31 MED ORDER — HYDROXYZINE HCL 25 MG PO TABS
25.0000 mg | ORAL_TABLET | Freq: Once | ORAL | Status: AC
  Administered 2016-12-31: 25 mg via ORAL
  Filled 2016-12-31: qty 1

## 2016-12-31 MED ORDER — PREDNISONE 50 MG PO TABS
60.0000 mg | ORAL_TABLET | ORAL | Status: AC
  Administered 2016-12-31: 60 mg via ORAL
  Filled 2016-12-31: qty 1

## 2016-12-31 MED ORDER — HYDROXYZINE HCL 25 MG PO TABS: 25.0000 mg | ORAL_TABLET | Freq: Four times a day (QID) | ORAL | 0 refills | Status: DC

## 2016-12-31 NOTE — Discharge Instructions (Signed)
As discussed, your evaluation today has been largely reassuring.  But, it is important that you monitor your condition carefully, and do not hesitate to return to the ED if you develop new, or concerning changes in your condition. ? ?Otherwise, please follow-up with your physician for appropriate ongoing care. ? ?

## 2016-12-31 NOTE — ED Triage Notes (Signed)
Pt reports generalized, itching rash x 2 weeks. Denies any new detergents or medications. Pt states he is concerned because it is now moving to his face.

## 2016-12-31 NOTE — ED Provider Notes (Signed)
[facility] DEPT Provider Note   CSN: [id number] Arrival date & time: 12/31/16  [id number]  By signing my name below, I, Charolotte Eke, attest that this documentation has been prepared under the direction and in the presence of Carmin Muskrat, MD. Electronically Signed: Charolotte Eke, Scribe. 12/31/16. 1:17 PM.     History   Chief Complaint Chief Complaint  Patient presents with  . Rash    HPI Jaime [name] is a 61 y.o. male with h/o broken neck, HTN, chronic pain, genital herpes who presents to the Emergency Department complaining of a worsening rash with waxing and waning pain that has began 8 days ago. Started on stomach spread to neck, ears, arm and down his legs. Pt describes some itchiness and warmth. Pt denies fever and vomiting. Pt denies that anything has changed in the patient's life. Pt was given z-pack for rash 7 days ago. Pt is now on amoxicillin for rash. Pt has no other associated symptoms. Pt denies rash is mouth or nose.   The history is provided by the patient.    Past Medical History:  Diagnosis Date  . Anxiety   . Chest pain   . Chronic pain    neck  . Constipation   . Genital herpes   . 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 Tobacco abuse     Patient Active Problem List   Diagnosis Date Noted  . Chronic constipation 05/11/2012  . Chest pain, atypical 12/14/2011  . Tobacco abuse 12/14/2011    Past Surgical History:  Procedure Laterality Date  . CHOLECYSTECTOMY  1982  . chordoma  1996  . COLONOSCOPY  2001   pt reports [facility] at [facility]  . COLONOSCOPY  05/30/2012   Procedure: COLONOSCOPY;  Surgeon: Daneil Dolin, MD;  [facility];  Service: Endoscopy;  Laterality: N/A;  10:45  . MASTOIDECTOMY  1991  . NECK SURGERY     fracture C3-7       Home Medications    Prior to Admission medications   Medication Sig Start Date End Date Taking? Authorizing Provider  acetaminophen (TYLENOL) 500 MG tablet  Take 500 mg by mouth every 6 (six) hours as needed for mild pain or moderate pain.    Historical Provider, MD  ALPRAZolam Duanne Moron) 1 MG tablet Take 1 mg by mouth 4 (four) times daily as needed. For nerves     Historical Provider, MD  aspirin EC 81 MG tablet Take 81 mg by mouth daily.    Historical Provider, MD  cyclobenzaprine (FLEXERIL) 10 MG tablet Take 1 tablet (10 mg total) by mouth 2 (two) times daily as needed for muscle spasms. 08/24/14   Hope Bunnie Pion, NP  dicyclomine (BENTYL) 20 MG tablet Take 1 tablet (20 mg total) by mouth 2 (two) times daily. 12/05/16   Isla Pence, MD  DULoxetine (CYMBALTA) 60 MG capsule Take 60 mg by mouth daily.    Historical Provider, MD  HYDROcodone-acetaminophen (NORCO) 10-325 MG per tablet Take 1 tablet by mouth 4 (four) times daily.    Historical Provider, MD  hydrOXYzine (ATARAX/VISTARIL) 25 MG tablet Take 1 tablet (25 mg total) by mouth every 6 (six) hours as needed for itching. 10/04/16   Evalee Jefferson, PA-C  meloxicam (MOBIC) 7.5 MG tablet Take 7.5 mg by mouth daily. 07/10/14   Historical Provider, MD  methocarbamol (ROBAXIN) 500 MG tablet Take 1 or 2 po Q 6hrs for muscle sorenesss Patient taking differently: Take 500-1,000  mg by mouth every 6 (six) hours as needed for muscle spasms.  12/15/15   Rolland Porter, MD  polyethylene glycol powder (GLYCOLAX/MIRALAX) powder Take 17 g by mouth daily.    Historical Provider, MD  valACYclovir (VALTREX) 500 MG tablet Take 500 mg by mouth 2 (two) times daily as needed. For breakout    Historical Provider, MD    Family History Family History  Problem Relation Age of Onset  . Hypertension Mother   . Heart disease Father   . Lung cancer Father   . Heart disease Sister   . Lung cancer Brother     Social History Social History  Substance Use Topics  . Smoking status: Current Every Day Smoker    Packs/day: 2.00    Years: 44.00    Types: Cigarettes  . Smokeless tobacco: Never Used  . Alcohol use No     Comment: quit about  4 years ago     Allergies   Celebrex [celecoxib]; Ciprofloxacin; Contrast media [iodinated diagnostic agents]; Dilaudid [hydromorphone]; Latex; Morphine and related; and Sulfa antibiotics   Review of Systems Review of Systems  Constitutional:       Per HPI, otherwise negative  HENT:       Per HPI, otherwise negative  Respiratory:       Per HPI, otherwise negative  Cardiovascular:       Per HPI, otherwise negative  Gastrointestinal: Negative for vomiting.  Endocrine:       Negative aside from HPI  Genitourinary:       Neg aside from HPI   Musculoskeletal:       Per HPI, otherwise negative  Skin: Positive for rash.  Allergic/Immunologic: Negative for immunocompromised state.  Neurological: Negative for syncope and weakness.     Physical Exam Updated Vital Signs BP 131/79 (BP Location: Left Arm)   Pulse 72   Temp 98.9 F (37.2 C) (Oral)   Resp 14   Ht 5\' 10"  (1.778 m)   Wt 140 lb (63.5 kg)   SpO2 98%   BMI 20.09 kg/m   Physical Exam  Constitutional: He is oriented to person, place, and time. He appears well-developed. No distress.  HENT:  Head: Normocephalic and atraumatic.  Eyes: Conjunctivae and EOM are normal.  Cardiovascular: Normal rate and regular rhythm.   Pulmonary/Chest: Effort normal. No stridor. No respiratory distress.  Abdominal: He exhibits no distension.  Musculoskeletal: He exhibits no edema.  Neurological: He is alert and oriented to person, place, and time. No cranial nerve deficit. He exhibits normal muscle tone.  Skin: Skin is warm and dry. Rash noted.  Diffuse, minimally raised petechial rash, no facial involvement, no oral pharyngeal involvement, no involvement of the ear canals, no palmar involvement  Psychiatric: He has a normal mood and affect.  Nursing note and vitals reviewed.    ED Treatments / Results   DIAGNOSTIC STUDIES: Oxygen Saturation is 98% on room air, normal by my interpretation.    COORDINATION OF CARE: 1:17 PM  Discussed treatment plan with pt at bedside and pt agreed to plan, which includes blood work.  Labs (all labs ordered are listed, but only abnormal results are displayed) Labs Reviewed  CBC WITH DIFFERENTIAL/PLATELET - Abnormal; Notable for the following:       Result Value   RBC 4.09 (*)    All other components within normal limits     Procedures Procedures (including critical care time)  Medications Ordered in ED Medications  predniSONE (DELTASONE) tablet 60 mg (not  administered)  hydrOXYzine (ATARAX/VISTARIL) tablet 25 mg (25 mg Oral Given 12/31/16 1347)     Initial Impression / Assessment and Plan / ED Course  I have reviewed the triage vital signs and the nursing notes.  Pertinent labs & imaging results that were available during my care of the patient were reviewed by me and considered in my medical decision making (see chart for details). Patient presents with ongoing rash, now after 2 separate antibiotics have not changed the condition measurably. Here the patient is awake and alert, afebrile, hemodynamically stable. No evidence for Stevens-Johnson syndrome, toxic epidermal necrosis. With the petechial characteristic, thrombocytopenia was consideration, but this is not demonstrated on labs. With no improvement following antibiotic, there is low suspicion for infectious pathology. Symptoms maybe inflammatory versus other reactive process. With no evidence for bacteremia, sepsis, the aforementioned dangerous rashes, the patient started on steroids, will follow-up with primary care.   Final Clinical Impressions(s) / ED Diagnoses   Rash  I personally performed the services described in this documentation, which was scribed in my presence. The recorded information has been reviewed and is accurate.       Carmin Muskrat, MD 12/31/16 [phone]

## 2017-01-08 ENCOUNTER — Emergency Department (HOSPITAL_COMMUNITY)
Admission: EM | Admit: 2017-01-08 | Discharge: 2017-01-08 | Disposition: A | Discharge status: Home / Self Care | Payer: Medicare Other | Attending: Emergency Medicine | Admitting: Emergency Medicine

## 2017-01-08 ENCOUNTER — Encounter (HOSPITAL_COMMUNITY): Payer: Self-pay

## 2017-01-08 DIAGNOSIS — Z7982 Long term (current) use of aspirin: Secondary | ICD-10-CM | POA: Insufficient documentation

## 2017-01-08 DIAGNOSIS — F17210 Nicotine dependence, cigarettes, uncomplicated: Secondary | ICD-10-CM | POA: Diagnosis not present

## 2017-01-08 DIAGNOSIS — R21 Rash and other nonspecific skin eruption: Secondary | ICD-10-CM | POA: Diagnosis not present

## 2017-01-08 DIAGNOSIS — Z91040 Latex allergy status: Secondary | ICD-10-CM | POA: Insufficient documentation

## 2017-01-08 MED ORDER — PREDNISONE 10 MG (21) PO TBPK: ORAL_TABLET | Freq: Every day | ORAL | 0 refills | Status: DC

## 2017-01-08 MED ORDER — PERMETHRIN 5 % EX CREA: TOPICAL_CREAM | CUTANEOUS | 1 refills | Status: DC

## 2017-01-08 MED ORDER — HYDROXYZINE HCL 25 MG PO TABS: 25.0000 mg | ORAL_TABLET | Freq: Three times a day (TID) | ORAL | 0 refills | Status: DC | PRN

## 2017-01-08 NOTE — ED Provider Notes (Signed)
[facility] DEPT Provider Note   CSN: [id number] Arrival date & time: 01/08/17  1057  By signing my name below, I, Hansel Feinstein, attest that this documentation has been prepared under the direction and in the presence of Malvin Johns, MD. Electronically Signed: Hansel Feinstein, ED Scribe. 01/08/17. 1:47 PM.    History   Chief Complaint Chief Complaint  Patient presents with  . Rash/ leg swelling    HPI Jaime [name] is a 61 y.o. male who presents to the Emergency Department complaining of a persistent, intermittent erythematous and pruritic rash to bilateral arms for 3 months. Wife also states she has recently noticed a pruritic rash on her bilateral forearms and the web spacing of her fingers last week. Pt reports a h/o scabies. Pt was seen in the ED on 12/31/16 for the same complaints and given rx for prednisone and Vistaril. Pt states he finished the course of steroids and notes it temporarily relieved his rash. He reports he has run out of the Vistaril. He also notes he has tried epsom salt baths with no relief. No worsening factors noted. He has bee seen by dermatology for this complaint, but not recently. He denies fever, additional complaints.   PCP- Alonza Bogus, MD    The history is provided by the patient. No language interpreter was used.    Past Medical History:  Diagnosis Date  . Anxiety   . Chest pain   . Chronic pain    neck  . Constipation   . Genital herpes   . 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 Tobacco abuse     Patient Active Problem List   Diagnosis Date Noted  . Chronic constipation 05/11/2012  . Chest pain, atypical 12/14/2011  . Tobacco abuse 12/14/2011    Past Surgical History:  Procedure Laterality Date  . CHOLECYSTECTOMY  1982  . chordoma  1996  . COLONOSCOPY  2001   pt reports normal at [facility]  . COLONOSCOPY  05/30/2012   Procedure: COLONOSCOPY;  Surgeon: Daneil Dolin, MD;  Location[facility];  Service: Endoscopy;  Laterality: N/A;  10:45  . MASTOIDECTOMY  1991  . NECK SURGERY     fracture C3-7       Home Medications    Prior to Admission medications   Medication Sig Start Date End Date Taking? Authorizing Provider  acetaminophen (TYLENOL) 500 MG tablet Take 500 mg by mouth every 6 (six) hours as needed for mild pain or moderate pain.    Historical Provider, MD  ALPRAZolam Duanne Moron) 1 MG tablet Take 1 mg by mouth 4 (four) times daily as needed. For nerves     Historical Provider, MD  aspirin EC 81 MG tablet Take 81 mg by mouth every other day.     Historical Provider, MD  diphenhydramine-acetaminophen (TYLENOL PM) 25-500 MG TABS tablet Take 1 tablet by mouth at bedtime as needed (sleep).    Historical Provider, MD  HYDROcodone-acetaminophen (NORCO) 10-325 MG per tablet Take 1 tablet by mouth 4 (four) times daily.    Historical Provider, MD  hydrOXYzine (ATARAX/VISTARIL) 25 MG tablet Take 1 tablet (25 mg total) by mouth every 8 (eight) hours as needed for itching. 01/08/17   Malvin Johns, MD  ibuprofen (ADVIL,MOTRIN) 200 MG tablet Take 200 mg by mouth every 6 (six) hours as needed for moderate pain.    Historical Provider, MD  meloxicam (MOBIC) 7.5 MG tablet Take 7.5 mg by mouth 2 (  two) times daily.  07/10/14   Historical Provider, MD  permethrin (ELIMITE) 5 % cream Apply to affected area once 01/08/17   Malvin Johns, MD  polyethylene glycol powder (GLYCOLAX/MIRALAX) powder Take 17 g by mouth daily.    Historical Provider, MD  predniSONE (STERAPRED UNI-PAK 21 TAB) 10 MG (21) TBPK tablet Take by mouth daily. Take 6 tabs by mouth daily  for 2 days, then 5 tabs for 2 days, then 4 tabs for 2 days, then 3 tabs for 2 days, 2 tabs for 2 days, then 1 tab by mouth daily for 2 days 01/08/17   Malvin Johns, MD  tamsulosin (FLOMAX) 0.4 MG CAPS capsule Take 0.4 mg by mouth daily.    Historical Provider, MD    Family History Family History  Problem Relation Age of Onset  . Hypertension  Mother   . Heart disease Father   . Lung cancer Father   . Heart disease Sister   . Lung cancer Brother     Social History Social History  Substance Use Topics  . Smoking status: Current Every Day Smoker    Packs/day: 2.00    Years: 44.00    Types: Cigarettes  . Smokeless tobacco: Never Used  . Alcohol use No     Comment: quit about 4 years ago     Allergies   Celebrex [celecoxib]; Ciprofloxacin; Contrast media [iodinated diagnostic agents]; Dilaudid [hydromorphone]; Latex; Morphine and related; and Sulfa antibiotics   Review of Systems Review of Systems  Constitutional: Negative for chills, diaphoresis, fatigue and fever.  HENT: Negative for congestion, rhinorrhea and sneezing.   Eyes: Negative.   Respiratory: Negative for cough, chest tightness and shortness of breath.   Cardiovascular: Negative for chest pain and leg swelling.  Gastrointestinal: Negative for abdominal pain, blood in stool, diarrhea, nausea and vomiting.  Genitourinary: Negative for difficulty urinating, flank pain, frequency and hematuria.  Musculoskeletal: Negative for arthralgias, back pain and neck pain.  Skin: Positive for rash. Negative for wound.  Neurological: Negative for dizziness, speech difficulty, weakness, numbness and headaches.     Physical Exam Updated Vital Signs BP 129/100   Pulse 101   Temp 98.3 F (36.8 C) (Oral)   Resp 18   SpO2 99%   Physical Exam  Constitutional: He is oriented to person, place, and time. He appears well-developed and well-nourished.  HENT:  Head: Normocephalic and atraumatic.  Neck: Normal range of motion. Neck supple.  Cardiovascular: Normal rate.   Pulmonary/Chest: Effort normal.  Neurological: He is alert and oriented to person, place, and time.  Skin: Skin is warm and dry. Rash noted.  Patient has a diffuse, scaly, erythematous, slightly raised rash to the trunk and extremities. There is no no supple petechiae or purpura. No signs of overlying  bacterial infection. It is blanching in places. No vesicles.  Psychiatric: He has a normal mood and affect.  Nursing note and vitals reviewed.    ED Treatments / Results   DIAGNOSTIC STUDIES: Oxygen Saturation is 99% on RA, normal by my interpretation.    COORDINATION OF CARE: 1:40 PM Discussed treatment plan with pt at bedside which includes steroid cream, Vistaril, scabies cream, dermatology f/u and pt agreed to plan.    Labs (all labs ordered are listed, but only abnormal results are displayed) Labs Reviewed - No data to display  EKG  EKG Interpretation None       Radiology No results found.  Procedures Procedures (including critical care time)  Medications Ordered in ED  Medications - No data to display   Initial Impression / Assessment and Plan / ED Course  I have reviewed the triage vital signs and the nursing notes.  Pertinent labs & imaging results that were available during my care of the patient were reviewed by me and considered in my medical decision making (see chart for details).     Patient presents with a diffuse rash. He has a history of eczema and it does appear to be allergic in nature. He's been seen in the past for a petechial type rash. He had a recent blood work which showed normal platelet count. He does not appear systemically ill. He took a 5 day course of prednisone starting February 24. It did improve the rash but it came back. I will retreat him with a course of steroids as well as Vistaril. His significant other has a rash that looks more like scabies. Given this, I will treat him for scabies as well with permethrin cream. He was encouraged to follow-up with his PCP or dermatology.  Final Clinical Impressions(s) / ED Diagnoses   Final diagnoses:  Rash    New Prescriptions New Prescriptions   PERMETHRIN (ELIMITE) 5 % CREAM    Apply to affected area once   PREDNISONE (STERAPRED UNI-PAK 21 TAB) 10 MG (21) TBPK TABLET    Take by mouth  daily. Take 6 tabs by mouth daily  for 2 days, then 5 tabs for 2 days, then 4 tabs for 2 days, then 3 tabs for 2 days, 2 tabs for 2 days, then 1 tab by mouth daily for 2 days    I personally performed the services described in this documentation, which was scribed in my presence.  The recorded information has been reviewed and considered.    Malvin Johns, MD 01/08/17 [phone]

## 2017-01-08 NOTE — ED Triage Notes (Signed)
Patient here with ongoing rash and itching to trunk and arms for a few months. States that he has been seen in [place] for same and taken steroids, vistaril with no relief.

## 2017-01-25 DIAGNOSIS — G8250 Quadriplegia, unspecified: Secondary | ICD-10-CM | POA: Diagnosis not present

## 2017-01-25 DIAGNOSIS — R21 Rash and other nonspecific skin eruption: Secondary | ICD-10-CM | POA: Diagnosis not present

## 2017-01-25 DIAGNOSIS — M545 Low back pain: Secondary | ICD-10-CM | POA: Diagnosis not present

## 2017-01-25 DIAGNOSIS — J449 Chronic obstructive pulmonary disease, unspecified: Secondary | ICD-10-CM | POA: Diagnosis not present

## 2017-03-13 DIAGNOSIS — G8250 Quadriplegia, unspecified: Secondary | ICD-10-CM | POA: Diagnosis not present

## 2017-03-13 DIAGNOSIS — J449 Chronic obstructive pulmonary disease, unspecified: Secondary | ICD-10-CM | POA: Diagnosis not present

## 2017-03-13 DIAGNOSIS — M545 Low back pain: Secondary | ICD-10-CM | POA: Diagnosis not present

## 2017-03-13 DIAGNOSIS — R21 Rash and other nonspecific skin eruption: Secondary | ICD-10-CM | POA: Diagnosis not present

## 2017-04-13 ENCOUNTER — Encounter: Payer: Self-pay | Admitting: Internal Medicine

## 2017-05-09 DIAGNOSIS — G8250 Quadriplegia, unspecified: Secondary | ICD-10-CM | POA: Diagnosis not present

## 2017-05-09 DIAGNOSIS — R21 Rash and other nonspecific skin eruption: Secondary | ICD-10-CM | POA: Diagnosis not present

## 2017-05-11 DIAGNOSIS — R21 Rash and other nonspecific skin eruption: Secondary | ICD-10-CM | POA: Diagnosis not present

## 2017-05-11 DIAGNOSIS — G8250 Quadriplegia, unspecified: Secondary | ICD-10-CM | POA: Diagnosis not present

## 2017-05-20 ENCOUNTER — Emergency Department (HOSPITAL_COMMUNITY)
Admission: EM | Admit: 2017-05-20 | Discharge: 2017-05-20 | Disposition: A | Discharge status: Home / Self Care | Payer: Medicare Other | Attending: Emergency Medicine | Admitting: Emergency Medicine

## 2017-05-20 ENCOUNTER — Encounter (HOSPITAL_COMMUNITY): Payer: Self-pay | Admitting: Emergency Medicine

## 2017-05-20 DIAGNOSIS — Z91040 Latex allergy status: Secondary | ICD-10-CM | POA: Diagnosis not present

## 2017-05-20 DIAGNOSIS — F17210 Nicotine dependence, cigarettes, uncomplicated: Secondary | ICD-10-CM | POA: Insufficient documentation

## 2017-05-20 DIAGNOSIS — R21 Rash and other nonspecific skin eruption: Secondary | ICD-10-CM

## 2017-05-20 DIAGNOSIS — Z79899 Other long term (current) drug therapy: Secondary | ICD-10-CM | POA: Insufficient documentation

## 2017-05-20 DIAGNOSIS — Z7982 Long term (current) use of aspirin: Secondary | ICD-10-CM | POA: Diagnosis not present

## 2017-05-20 MED ORDER — PREDNISONE 10 MG PO TABS: 10.0000 mg | ORAL_TABLET | Freq: Every day | ORAL | 0 refills | Status: DC

## 2017-05-20 NOTE — ED Provider Notes (Signed)
The patient is a 61 year old male who has chronic rash over the last 6 months, has been seen multiple times and given multiple different treatment regimens, has been seen by his family doctor as well as a dermatologist, he does not currently have a secure diagnosis other than what was thought to be an eczema or contact dermatitis. He has had persistent rash, he is now itching extremely over his extremities as well as in the gluteal cleft. On exam the patient does have diffuse lesions which are papular, excoriated, clustered and found specifically in a large amount over the gluteal cleft but not the perianal area. This does appear to be inflammatory, he will need an extended course of prednisone, I do not see any super infections or skin infections, the patient is in agreement with the plan. He has been encouraged to follow-up with his family doctor so as not to stop steroid use abruptly.  Medical screening examination/treatment/procedure(s) were conducted as a shared visit with non-physician practitioner(s) and myself.  I personally evaluated the patient during the encounter.  Clinical Impression:   Final diagnoses:  Rash         Noemi Chapel, MD 06/02/17 1249

## 2017-05-20 NOTE — ED Provider Notes (Signed)
[facility] DEPT [facility] Provider Note   CSN: [id number] Arrival date & time: 05/20/17  1342     History   Chief Complaint Chief Complaint  Patient presents with  . Rash    HPI Jaime [name] is a 61 y.o. male who presents with diffuse generalized rash that has been ongoing for the past 6 months. Patient has been seen multiple times in the emergency department and by dermatology. He has had multiple diagnoses, including scabies, eczema and contact dermatitis.  He has been treated with prednisone, Vistaril, permethrin and receives mild, temporary improvement in symptoms but then rash will return. Patient reports that he recently visited a dermatologist who told him he had excellent. Patient reports that prednisone temporarily helps symptoms but that they eventually returned. Patientreports that the rash is pruritic but not painful. He denies any new soaps, detergents, lotions, new medications or other new exposures. Patient denies any fever or any other concerns.  The history is provided by the patient.    Past Medical History:  Diagnosis Date  . Anxiety   . Chest pain   . Chronic pain    neck  . Constipation   . Genital herpes   . 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 Tobacco abuse     Patient Active Problem List   Diagnosis Date Noted  . Chronic constipation 05/11/2012  . Chest pain, atypical 12/14/2011  . Tobacco abuse 12/14/2011    Past Surgical History:  Procedure Laterality Date  . CHOLECYSTECTOMY  1982  . chordoma  1996  . COLONOSCOPY  2001   pt reports normal at [facility]  . COLONOSCOPY  05/30/2012   Procedure: COLONOSCOPY;  Surgeon: Daneil Dolin, MD;  Location: [facility];  Service: Endoscopy;  Laterality: N/A;  10:45  . MASTOIDECTOMY  1991  . NECK SURGERY     fracture C3-7       Home Medications    Prior to Admission medications   Medication Sig Start Date End Date Taking? Authorizing Provider  acetaminophen  (TYLENOL) 500 MG tablet Take 500 mg by mouth every 6 (six) hours as needed for mild pain or moderate pain.    [provider]  ALPRAZolam Duanne Moron) 1 MG tablet Take 1 mg by mouth 4 (four) times daily as needed. For nerves     [provider]  aspirin EC 81 MG tablet Take 81 mg by mouth every other day.     [provider]  diphenhydramine-acetaminophen (TYLENOL PM) 25-500 MG TABS tablet Take 1 tablet by mouth at bedtime as needed (sleep).    [provider]  HYDROcodone-acetaminophen (NORCO) 10-325 MG per tablet Take 1 tablet by mouth 4 (four) times daily.    [provider]  hydrOXYzine (ATARAX/VISTARIL) 25 MG tablet Take 1 tablet (25 mg total) by mouth every 8 (eight) hours as needed for itching. 01/08/17   Malvin Johns, MD  ibuprofen (ADVIL,MOTRIN) 200 MG tablet Take 200 mg by mouth every 6 (six) hours as needed for moderate pain.    [provider]  meloxicam (MOBIC) 7.5 MG tablet Take 7.5 mg by mouth 2 (two) times daily.  07/10/14   [provider]  permethrin (ELIMITE) 5 % cream Apply to affected area once 01/08/17   Malvin Johns, MD  polyethylene glycol powder (GLYCOLAX/MIRALAX) powder Take 17 g by mouth daily.    [provider]  predniSONE (DELTASONE) 10 MG tablet Take 1 tablet (10 mg total) by  mouth daily. Take 6 tablets for 3 days. After that take 1 tablet a day. 05/20/17   Jaime Napoleon, PA-C  tamsulosin (FLOMAX) 0.4 MG CAPS capsule Take 0.4 mg by mouth daily.    [provider]    Family History Family History  Problem Relation Age of Onset  . Hypertension Mother   . Heart disease Father   . Lung cancer Father   . Heart disease Sister   . Lung cancer Brother     Social History Social History  Substance Use Topics  . Smoking status: Current Every Day Smoker    Packs/day: 2.00    Years: 44.00    Types: Cigarettes  . Smokeless tobacco: Never Used  . Alcohol use No     Comment: quit about 4 years  ago     Allergies   Celebrex [celecoxib]; Ciprofloxacin; Contrast media [iodinated diagnostic agents]; Dilaudid [hydromorphone]; Latex; Morphine and related; and Sulfa antibiotics   Review of Systems Review of Systems  Constitutional: Negative for fever.  Skin: Positive for rash.     Physical Exam Updated Vital Signs BP (!) 150/91   Pulse 86   Temp 98.5 F (36.9 C)   Resp 18   Ht 5' 10.5" (1.791 m)   Wt 65.8 kg (145 lb)   SpO2 100%   BMI 20.51 kg/m   Physical Exam  Constitutional: He appears well-developed and well-nourished.  Sitting comfortably on examination table  HENT:  Head: Normocephalic and atraumatic.  No oral lesions  Eyes: Conjunctivae and EOM are normal. Right eye exhibits no discharge. Left eye exhibits no discharge. No scleral icterus.  Pulmonary/Chest: Effort normal.  Neurological: He is alert.  Skin: Skin is warm and dry.  Diffuse maculopapular rash with scattered excoriations to the bilateral upper extremity, anterior chest, bilateral lower extremities, lower abdomen. No involvement of palms or soles of feet. Multiple, grouped plaques with overlying excoriation to the bilateral gluteal region.  Psychiatric: He has a normal mood and affect. His speech is normal and behavior is normal.  Nursing note and vitals reviewed.    ED Treatments / Results  Labs (all labs ordered are listed, but only abnormal results are displayed) Labs Reviewed  RPR  HIV ANTIBODY (ROUTINE TESTING)    EKG  EKG Interpretation None       Radiology No results found.  Procedures Procedures (including critical care time)  Medications Ordered in ED Medications - No data to display   Initial Impression / Assessment and Plan / ED Course  I have reviewed the triage vital signs and the nursing notes.  Pertinent labs & imaging results that were available during my care of the patient were reviewed by me and considered in my medical decision making (see chart for  details).     61 y.o. M with 6 months of persistent rash. Multiple diagnosis with no conclusive answer. Multiple treatments with minimal, temporary relief. Returns to the ED today with persistent rash. Patient is afebrile, non-toxic appearing, sitting comfortably on examination table. Rash does not appear to be infected. Unsure of exact etiology of rash. Will plan to treat with long course of prednisone. Will obtain RPR and HIV on patient for evaluation of any immunocompromised states. Instructed patient to follow up with his dermatologist and PCP for further evaluation. Strict return precautions discussed. Patient expresses understanding and agreement to plan.    Final Clinical Impressions(s) / ED Diagnoses   Final diagnoses:  Rash    New Prescriptions Discharge Medication List  as of 05/20/2017  3:09 PM    START taking these medications   Details  predniSONE (DELTASONE) 10 MG tablet Take 1 tablet (10 mg total) by mouth daily. Take 6 tablets for 3 days. After that take 1 tablet a day., Starting Sat 05/20/2017, Print         Desma Mcgregor 05/21/17 2314    Noemi Chapel, MD 06/02/17 1249

## 2017-05-20 NOTE — ED Triage Notes (Signed)
Pt c/o itching generalized rash x 6 months. States he has been treated for scabies and eczema.

## 2017-05-20 NOTE — Discharge Instructions (Signed)
As we discussed, he'll need to take 6 tablets of prednisone for 3 days. After those 3 days she will take 1 tablet a day.  Follow-up with her primary care doctor in next 24-48 hours for further evaluation.  Return the emergency room for any worsening rash, pain, fevers or any other worsening or concerning symptoms.

## 2017-05-20 NOTE — ED Notes (Addendum)
Pt states he was diagnosed with Scabies years ago. Now states a doctor in [place] told him he didn't have scabies but that he had eczema.

## 2017-05-22 LAB — HIV ANTIBODY (ROUTINE TESTING W REFLEX): HIV Screen 4th Generation wRfx: NONREACTIVE

## 2017-05-23 LAB — RPR: RPR Ser Ql: NONREACTIVE

## 2017-05-27 ENCOUNTER — Emergency Department (HOSPITAL_COMMUNITY)
Admission: EM | Admit: 2017-05-27 | Discharge: 2017-05-27 | Disposition: A | Discharge status: Home / Self Care | Payer: Medicare Other | Attending: Emergency Medicine | Admitting: Emergency Medicine

## 2017-05-27 ENCOUNTER — Encounter (HOSPITAL_COMMUNITY): Payer: Self-pay | Admitting: Emergency Medicine

## 2017-05-27 DIAGNOSIS — R21 Rash and other nonspecific skin eruption: Secondary | ICD-10-CM | POA: Diagnosis not present

## 2017-05-27 NOTE — ED Triage Notes (Signed)
Pt c/o generalized body rash ongoing since December. Pt has been seen by dermatologist for same and given multiple doses of steroids and antibiotics without relief. Pt presents with red raised bumps all over body.

## 2017-05-27 NOTE — ED Provider Notes (Signed)
[facility] DEPT Provider Note   CSN: [id number] Arrival date & time: 05/27/17  1408     History   Chief Complaint Chief Complaint  Patient presents with  . Rash    HPI Jaime [name] is a 61 y.o. male.  HPI   61 year old male with history of anxiety, chronic neck pain, presenting for evaluation of body rash. Patient has had chronic rash over his body for the last 6 months and has been seen multiple times in the ER as well as given multiple different treatment for his rash without relief. This rash was thought to be either contact him otitis or eczema but no definitive diagnosis were made. He was last seen a week ago for the same rash. He was giving an extended course of steroid and encouraged to follow-up with his family doctor. Pt sts he took the steroid so many times without any relief.  He has been seen by dermatologist in [place] 2 months ago, was given some type of creams but it has not helped. He has not f/u due to inability to pay the co-pay.  He denies fever, headache, joint pain, trouble breathing or abdominal cramping.  He has tried avoiding scented soap but no relief.    Past Medical History:  Diagnosis Date  . Anxiety   . Chest pain   . Chronic pain    neck  . Constipation   . Genital herpes   . 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 Tobacco abuse     Patient Active Problem List   Diagnosis Date Noted  . Chronic constipation 05/11/2012  . Chest pain, atypical 12/14/2011  . Tobacco abuse 12/14/2011    Past Surgical History:  Procedure Laterality Date  . CHOLECYSTECTOMY  1982  . chordoma  1996  . COLONOSCOPY  2001   pt reports normal at [facility]  . COLONOSCOPY  05/30/2012   Procedure: COLONOSCOPY;  Surgeon: Daneil Dolin, MD;  [facility];  Service: Endoscopy;  Laterality: N/A;  10:45  . MASTOIDECTOMY  1991  . NECK SURGERY     fracture C3-7       Home Medications    Prior to Admission  medications   Medication Sig Start Date End Date Taking? Authorizing Provider  acetaminophen (TYLENOL) 500 MG tablet Take 500 mg by mouth every 6 (six) hours as needed for mild pain or moderate pain.    [provider]  ALPRAZolam Duanne Moron) 1 MG tablet Take 1 mg by mouth 4 (four) times daily as needed. For nerves     [provider]  aspirin EC 81 MG tablet Take 81 mg by mouth every other day.     [provider]  diphenhydramine-acetaminophen (TYLENOL PM) 25-500 MG TABS tablet Take 1 tablet by mouth at bedtime as needed (sleep).    [provider]  HYDROcodone-acetaminophen (NORCO) 10-325 MG per tablet Take 1 tablet by mouth 4 (four) times daily.    [provider]  hydrOXYzine (ATARAX/VISTARIL) 25 MG tablet Take 1 tablet (25 mg total) by mouth every 8 (eight) hours as needed for itching. 01/08/17   Malvin Johns, MD  ibuprofen (ADVIL,MOTRIN) 200 MG tablet Take 200 mg by mouth every 6 (six) hours as needed for moderate pain.    [provider]  meloxicam (MOBIC) 7.5 MG tablet Take 7.5 mg by mouth 2 (two) times daily.  07/10/14   [provider]  permethrin (ELIMITE) 5 % cream Apply  to affected area once 01/08/17   Malvin Johns, MD  polyethylene glycol powder (GLYCOLAX/MIRALAX) powder Take 17 g by mouth daily.    [provider]  predniSONE (DELTASONE) 10 MG tablet Take 1 tablet (10 mg total) by mouth daily. Take 6 tablets for 3 days. After that take 1 tablet a day. 05/20/17   Volanda Napoleon, PA-C  tamsulosin (FLOMAX) 0.4 MG CAPS capsule Take 0.4 mg by mouth daily.    [provider]    Family History Family History  Problem Relation Age of Onset  . Hypertension Mother   . Heart disease Father   . Lung cancer Father   . Heart disease Sister   . Lung cancer Brother     Social History Social History  Substance Use Topics  . Smoking status: Current Every Day Smoker    Packs/day: 2.00    Years: 44.00    Types:  Cigarettes  . Smokeless tobacco: Never Used  . Alcohol use No     Comment: quit about 4 years ago     Allergies   Celebrex [celecoxib]; Ciprofloxacin; Contrast media [iodinated diagnostic agents]; Dilaudid [hydromorphone]; Latex; Morphine and related; and Sulfa antibiotics   Review of Systems Review of Systems  All other systems reviewed and are negative.    Physical Exam Updated Vital Signs BP (!) 153/96   Pulse (!) 108   Temp 98.1 F (36.7 C)   Resp 18   Ht 5' 10.5" (1.791 m)   Wt 65.8 kg (145 lb)   SpO2 97%   BMI 20.51 kg/m   Physical Exam  Constitutional: He appears well-developed and well-nourished. No distress.  HENT:  Head: Atraumatic.  Mouth/Throat: Oropharynx is clear and moist.  Eyes: Conjunctivae are normal.  Neck: Neck supple.  No nuchal rigidity  Cardiovascular: Normal rate and regular rhythm.   Abdominal: Soft. Bowel sounds are normal.  Neurological: He is alert.  Skin: Rash (Diffuse maculopapular rash noted throughout body measuring 2-3 mm in diameter in size most significant to gluteal region. No rash on palms of hands associated feet. No oral mucosal involvement) noted.  Psychiatric: He has a normal mood and affect.  Nursing note and vitals reviewed.    ED Treatments / Results  Labs (all labs ordered are listed, but only abnormal results are displayed) Labs Reviewed - No data to display  EKG  EKG Interpretation None       Radiology No results found.  Procedures Procedures (including critical care time)  Medications Ordered in ED Medications - No data to display   Initial Impression / Assessment and Plan / ED Course  I have reviewed the triage vital signs and the nursing notes.  Pertinent labs & imaging results that were available during my care of the patient were reviewed by me and considered in my medical decision making (see chart for details).     BP (!) 138/94 (BP Location: Right Arm)   Pulse 73   Temp 98.1 F (36.7  C)   Resp 20   Ht 5' 10.5" (1.791 m)   Wt 65.8 kg (145 lb)   SpO2 99%   BMI 20.51 kg/m    Final Clinical Impressions(s) / ED Diagnoses   Final diagnoses:  Rash and nonspecific skin eruption    New Prescriptions New Prescriptions   No medications on file   4:33 PM Patient with chronic itchy rash noted throughout body ongoing for the past 7 months unrelieved despite using multiple treatment for atopic dermatitis or  eczema. Rash does not appear infectious in etiology. At this time, I do not think additional symptomatic medication for his rash is indicated as it has not helped in the past. I will give patient a referral to a different dermatologist and encourage patient to follow-up closely for further care. Encouraged patient to continue to take his steroid and to avoid stopping it abruptly.   Domenic Moras, PA-C 05/27/17 1638    Charlesetta Shanks, MD 05/27/17 [phone]

## 2017-05-27 NOTE — ED Notes (Signed)
Declined W/C at D/C and was escorted to lobby by RN. 

## 2017-05-27 NOTE — Discharge Instructions (Signed)
Please follow up with dermatologist for further evaluation of your rash. Do not stop taking your steroid abruptly.

## 2017-06-08 ENCOUNTER — Observation Stay (HOSPITAL_COMMUNITY)
Admission: EM | Admit: 2017-06-08 | Discharge: 2017-06-09 | Disposition: A | Discharge status: Home / Self Care | Payer: Medicare Other | Attending: Internal Medicine | Admitting: Internal Medicine

## 2017-06-08 ENCOUNTER — Encounter (HOSPITAL_COMMUNITY): Payer: Self-pay

## 2017-06-08 ENCOUNTER — Emergency Department (HOSPITAL_COMMUNITY): Payer: Medicare Other

## 2017-06-08 DIAGNOSIS — Z23 Encounter for immunization: Secondary | ICD-10-CM | POA: Insufficient documentation

## 2017-06-08 DIAGNOSIS — Z91040 Latex allergy status: Secondary | ICD-10-CM | POA: Diagnosis not present

## 2017-06-08 DIAGNOSIS — Z79899 Other long term (current) drug therapy: Secondary | ICD-10-CM | POA: Diagnosis not present

## 2017-06-08 DIAGNOSIS — R21 Rash and other nonspecific skin eruption: Secondary | ICD-10-CM | POA: Diagnosis not present

## 2017-06-08 DIAGNOSIS — Z7982 Long term (current) use of aspirin: Secondary | ICD-10-CM | POA: Insufficient documentation

## 2017-06-08 DIAGNOSIS — F17210 Nicotine dependence, cigarettes, uncomplicated: Secondary | ICD-10-CM | POA: Diagnosis not present

## 2017-06-08 DIAGNOSIS — J439 Emphysema, unspecified: Secondary | ICD-10-CM | POA: Diagnosis present

## 2017-06-08 DIAGNOSIS — K5909 Other constipation: Secondary | ICD-10-CM | POA: Diagnosis present

## 2017-06-08 DIAGNOSIS — Z720 Tobacco use: Secondary | ICD-10-CM | POA: Diagnosis present

## 2017-06-08 DIAGNOSIS — Z791 Long term (current) use of non-steroidal anti-inflammatories (NSAID): Secondary | ICD-10-CM | POA: Diagnosis not present

## 2017-06-08 DIAGNOSIS — R000 Tachycardia, unspecified: Secondary | ICD-10-CM | POA: Diagnosis not present

## 2017-06-08 DIAGNOSIS — R509 Fever, unspecified: Principal | ICD-10-CM | POA: Diagnosis present

## 2017-06-08 DIAGNOSIS — Z1211 Encounter for screening for malignant neoplasm of colon: Secondary | ICD-10-CM

## 2017-06-08 DIAGNOSIS — N400 Enlarged prostate without lower urinary tract symptoms: Secondary | ICD-10-CM

## 2017-06-08 DIAGNOSIS — D72829 Elevated white blood cell count, unspecified: Secondary | ICD-10-CM | POA: Diagnosis present

## 2017-06-08 DIAGNOSIS — R0602 Shortness of breath: Secondary | ICD-10-CM | POA: Diagnosis not present

## 2017-06-08 DIAGNOSIS — B86 Scabies: Secondary | ICD-10-CM | POA: Diagnosis present

## 2017-06-08 DIAGNOSIS — R590 Localized enlarged lymph nodes: Secondary | ICD-10-CM | POA: Diagnosis not present

## 2017-06-08 DIAGNOSIS — L298 Other pruritus: Secondary | ICD-10-CM | POA: Diagnosis present

## 2017-06-08 DIAGNOSIS — L2989 Other pruritus: Secondary | ICD-10-CM | POA: Diagnosis present

## 2017-06-08 DIAGNOSIS — G8929 Other chronic pain: Secondary | ICD-10-CM | POA: Diagnosis present

## 2017-06-08 HISTORY — DX: Benign prostatic hyperplasia without lower urinary tract symptoms: N40.0

## 2017-06-08 LAB — COMPREHENSIVE METABOLIC PANEL
ALBUMIN: 3.8 g/dL (ref 3.5–5.0)
ALK PHOS: 61 U/L (ref 38–126)
ALT: 15 U/L — ABNORMAL LOW (ref 17–63)
AST: 15 U/L (ref 15–41)
Anion gap: 6 (ref 5–15)
BUN: 10 mg/dL (ref 6–20)
CALCIUM: 9.2 mg/dL (ref 8.9–10.3)
CO2: 30 mmol/L (ref 22–32)
Chloride: 101 mmol/L (ref 101–111)
Creatinine, Ser: 0.73 mg/dL (ref 0.61–1.24)
GFR calc Af Amer: >60 mL/min (ref >60)
GFR calc non Af Amer: >60 mL/min (ref >60)
Glucose, Bld: 96 mg/dL (ref 65–99)
Potassium: 3.9 mmol/L (ref 3.5–5.1)
SODIUM: 137 mmol/L (ref 135–145)
TOTAL PROTEIN: 7.1 g/dL (ref 6.5–8.1)
Total Bilirubin: 0.8 mg/dL (ref 0.3–1.2)

## 2017-06-08 LAB — URINALYSIS, ROUTINE W REFLEX MICROSCOPIC
BACTERIA UA: NONE SEEN
Bilirubin Urine: NEGATIVE
Glucose, UA: NEGATIVE mg/dL
Ketones, ur: NEGATIVE mg/dL
Leukocytes, UA: NEGATIVE
NITRITE: NEGATIVE
PH: 7 (ref 5.0–8.0)
Protein, ur: NEGATIVE mg/dL
SPECIFIC GRAVITY, URINE: 1.013 (ref 1.005–1.030)

## 2017-06-08 LAB — CBC WITH DIFFERENTIAL/PLATELET
BASOS ABS: 0 10*3/uL (ref 0.0–0.1)
BASOS PCT: 0 %
Eosinophils Absolute: 0.2 10*3/uL (ref 0.0–0.7)
Eosinophils Relative: 1 %
HEMATOCRIT: 39.8 % (ref 39.0–52.0)
HEMOGLOBIN: 13.3 g/dL (ref 13.0–17.0)
LYMPHS PCT: 17 %
Lymphs Abs: 2.4 10*3/uL (ref 0.7–4.0)
MCH: 32.9 pg (ref 26.0–34.0)
MCHC: 33.4 g/dL (ref 30.0–36.0)
MCV: 98.5 fL (ref 78.0–100.0)
MONO ABS: 0.9 10*3/uL (ref 0.1–1.0)
Monocytes Relative: 6 %
NEUTROS ABS: 10.9 10*3/uL — AB (ref 1.7–7.7)
NEUTROS PCT: 76 %
Platelets: 167 10*3/uL (ref 150–400)
RBC: 4.04 MIL/uL — AB (ref 4.22–5.81)
RDW: 13.3 % (ref 11.5–15.5)
WBC: 14.3 10*3/uL — AB (ref 4.0–10.5)

## 2017-06-08 LAB — RAPID STREP SCREEN (MED CTR MEBANE ONLY): Streptococcus, Group A Screen (Direct): NEGATIVE

## 2017-06-08 LAB — LACTIC ACID, PLASMA: LACTIC ACID, VENOUS: 0.9 mmol/L (ref 0.5–1.9)

## 2017-06-08 LAB — TROPONIN I: Troponin I: <0.03 ng/mL (ref <0.03)

## 2017-06-08 MED ORDER — HYDROXYZINE HCL 25 MG PO TABS: 25.0000 mg | ORAL_TABLET | Freq: Three times a day (TID) | ORAL | Status: DC | PRN

## 2017-06-08 MED ORDER — LORAZEPAM 1 MG PO TABS
1.0000 mg | ORAL_TABLET | Freq: Once | ORAL | Status: AC
  Administered 2017-06-08: 1 mg via ORAL
  Filled 2017-06-08: qty 1

## 2017-06-08 MED ORDER — ACETAMINOPHEN 650 MG RE SUPP: 650.0000 mg | Freq: Four times a day (QID) | RECTAL | Status: DC | PRN

## 2017-06-08 MED ORDER — PREDNISONE 10 MG PO TABS
10.0000 mg | ORAL_TABLET | Freq: Every day | ORAL | Status: DC
  Administered 2017-06-09: 10 mg via ORAL
  Filled 2017-06-08: qty 1

## 2017-06-08 MED ORDER — DIPHENHYDRAMINE-APAP (SLEEP) 25-500 MG PO TABS: 1.0000 | ORAL_TABLET | Freq: Every evening | ORAL | Status: DC | PRN

## 2017-06-08 MED ORDER — SODIUM CHLORIDE 0.9 % IV BOLUS (SEPSIS)
1000.0000 mL | Freq: Once | INTRAVENOUS | Status: AC
  Administered 2017-06-08: 1000 mL via INTRAVENOUS

## 2017-06-08 MED ORDER — POLYETHYLENE GLYCOL 3350 17 GM/SCOOP PO POWD
17.0000 g | Freq: Every day | ORAL | Status: DC
  Filled 2017-06-08: qty 255

## 2017-06-08 MED ORDER — SODIUM CHLORIDE 0.9 % IV BOLUS (SEPSIS): 1000.0000 mL | Freq: Once | INTRAVENOUS | Status: DC

## 2017-06-08 MED ORDER — HYDROCODONE-ACETAMINOPHEN 5-325 MG PO TABS
2.0000 | ORAL_TABLET | Freq: Once | ORAL | Status: AC
  Administered 2017-06-08: 2 via ORAL
  Filled 2017-06-08: qty 2

## 2017-06-08 MED ORDER — ACETAMINOPHEN 325 MG PO TABS: 650.0000 mg | ORAL_TABLET | Freq: Four times a day (QID) | ORAL | Status: DC | PRN

## 2017-06-08 MED ORDER — TAMSULOSIN HCL 0.4 MG PO CAPS
0.4000 mg | ORAL_CAPSULE | Freq: Every day | ORAL | Status: DC
  Administered 2017-06-09: 0.4 mg via ORAL
  Filled 2017-06-08: qty 1

## 2017-06-08 MED ORDER — HYDROCODONE-ACETAMINOPHEN 10-325 MG PO TABS
1.0000 | ORAL_TABLET | Freq: Four times a day (QID) | ORAL | Status: DC
  Administered 2017-06-09 (×3): 1 via ORAL
  Filled 2017-06-08 (×3): qty 1

## 2017-06-08 MED ORDER — ENOXAPARIN SODIUM 40 MG/0.4ML ~~LOC~~ SOLN
40.0000 mg | SUBCUTANEOUS | Status: DC
  Administered 2017-06-09: 40 mg via SUBCUTANEOUS
  Filled 2017-06-08: qty 0.4

## 2017-06-08 MED ORDER — PNEUMOCOCCAL VAC POLYVALENT 25 MCG/0.5ML IJ INJ
0.5000 mL | INJECTION | INTRAMUSCULAR | Status: AC
  Administered 2017-06-09: 0.5 mL via INTRAMUSCULAR
  Filled 2017-06-08: qty 0.5

## 2017-06-08 MED ORDER — PROMETHAZINE HCL 25 MG/ML IJ SOLN
25.0000 mg | Freq: Once | INTRAMUSCULAR | Status: AC
  Administered 2017-06-09: 25 mg via INTRAVENOUS
  Filled 2017-06-08: qty 1

## 2017-06-08 MED ORDER — IBUPROFEN 400 MG PO TABS: 200.0000 mg | ORAL_TABLET | Freq: Four times a day (QID) | ORAL | Status: DC | PRN

## 2017-06-08 MED ORDER — ACETAMINOPHEN 500 MG PO TABS: 500.0000 mg | ORAL_TABLET | Freq: Every evening | ORAL | Status: DC | PRN

## 2017-06-08 MED ORDER — ALPRAZOLAM 1 MG PO TABS
1.0000 mg | ORAL_TABLET | Freq: Three times a day (TID) | ORAL | Status: DC | PRN
  Administered 2017-06-09 (×2): 1 mg via ORAL
  Filled 2017-06-08 (×2): qty 1

## 2017-06-08 MED ORDER — DIPHENHYDRAMINE HCL 25 MG PO CAPS: 25.0000 mg | ORAL_CAPSULE | Freq: Every evening | ORAL | Status: DC | PRN

## 2017-06-08 MED ORDER — ASPIRIN EC 81 MG PO TBEC
81.0000 mg | DELAYED_RELEASE_TABLET | ORAL | Status: DC
  Administered 2017-06-09: 81 mg via ORAL
  Filled 2017-06-08: qty 1

## 2017-06-08 MED ORDER — SODIUM CHLORIDE 0.9 % IV SOLN
INTRAVENOUS | Status: DC
  Administered 2017-06-08 – 2017-06-09 (×2): via INTRAVENOUS

## 2017-06-08 MED ORDER — ACETAMINOPHEN 500 MG PO TABS: 500.0000 mg | ORAL_TABLET | Freq: Four times a day (QID) | ORAL | Status: DC | PRN

## 2017-06-08 NOTE — ED Notes (Signed)
Patient transported to X-ray 

## 2017-06-08 NOTE — ED Provider Notes (Signed)
[facility] DEPT Provider Note   CSN: [id number] Arrival date & time: 06/08/17  1956     History   Chief Complaint Chief Complaint  Patient presents with  . Fever    HPI Jaime [name] is a 61 y.o. male.  HPI Patient presents with fever and rash. Has been dealing with the rash for at least the last 6-8 months. No clear diagnosis but has had thought of eczema contact dermatitis or scabies. Other family members in the house did not have it. Now started to have fevers and feeling bad. Began start this evening. Patient denied having any cough but did cough up some sputum while I was examining him. No nausea vomiting. No dysuria. No chest pain. No real change in the rash. No neck pain. Is currently on steroids for the rash is not on antibiotics. Past Medical History:  Diagnosis Date  . Anxiety   . Chest pain   . Chronic pain    neck  . Constipation   . Genital herpes   . 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 Tobacco abuse     Patient Active Problem List   Diagnosis Date Noted  . Chronic constipation 05/11/2012  . Chest pain, atypical 12/14/2011  . Tobacco abuse 12/14/2011    Past Surgical History:  Procedure Laterality Date  . CHOLECYSTECTOMY  1982  . chordoma  1996  . COLONOSCOPY  2001   pt reports normal at [facility]  . COLONOSCOPY  05/30/2012   Procedure: COLONOSCOPY;  Surgeon: Daneil Dolin, MD;  Location: [facility];  Service: Endoscopy;  Laterality: N/A;  10:45  . MASTOIDECTOMY  1991  . NECK SURGERY     fracture C3-7       Home Medications    Prior to Admission medications   Medication Sig Start Date End Date Taking? Authorizing Provider  acetaminophen (TYLENOL) 500 MG tablet Take 500 mg by mouth every 6 (six) hours as needed for mild pain or moderate pain.    [provider]  ALPRAZolam Duanne Moron) 1 MG tablet Take 1 mg by mouth 4 (four) times daily as needed. For nerves     [provider]  aspirin  EC 81 MG tablet Take 81 mg by mouth every other day.     [provider]  diphenhydramine-acetaminophen (TYLENOL PM) 25-500 MG TABS tablet Take 1 tablet by mouth at bedtime as needed (sleep).    [provider]  HYDROcodone-acetaminophen (NORCO) 10-325 MG per tablet Take 1 tablet by mouth 4 (four) times daily.    [provider]  hydrOXYzine (ATARAX/VISTARIL) 25 MG tablet Take 1 tablet (25 mg total) by mouth every 8 (eight) hours as needed for itching. 01/08/17   Malvin Johns, MD  ibuprofen (ADVIL,MOTRIN) 200 MG tablet Take 200 mg by mouth every 6 (six) hours as needed for moderate pain.    [provider]  meloxicam (MOBIC) 7.5 MG tablet Take 7.5 mg by mouth 2 (two) times daily.  07/10/14   [provider]  permethrin (ELIMITE) 5 % cream Apply to affected area once 01/08/17   Malvin Johns, MD  polyethylene glycol powder (GLYCOLAX/MIRALAX) powder Take 17 g by mouth daily.    [provider]  predniSONE (DELTASONE) 10 MG tablet Take 1 tablet (10 mg total) by mouth daily. Take 6 tablets for 3 days. After that take 1 tablet a day. 05/20/17   Volanda Napoleon, PA-C  tamsulosin (FLOMAX) 0.4 MG CAPS  capsule Take 0.4 mg by mouth daily.    [provider]    Family History Family History  Problem Relation Age of Onset  . Hypertension Mother   . Heart disease Father   . Lung cancer Father   . Heart disease Sister   . Lung cancer Brother     Social History Social History  Substance Use Topics  . Smoking status: Current Every Day Smoker    Packs/day: 2.00    Years: 44.00    Types: Cigarettes  . Smokeless tobacco: Never Used  . Alcohol use No     Comment: quit about 4 years ago     Allergies   Celebrex [celecoxib]; Ciprofloxacin; Contrast media [iodinated diagnostic agents]; Dilaudid [hydromorphone]; Latex; Morphine and related; and Sulfa antibiotics   Review of Systems Review of Systems  Constitutional: Positive for appetite  change and fever.  HENT: Negative for congestion and sore throat.   Eyes: Negative for photophobia.  Respiratory: Negative for cough and shortness of breath.   Cardiovascular: Negative for chest pain.  Gastrointestinal: Negative for abdominal pain.  Endocrine: Negative for polyuria.  Genitourinary: Negative for penile pain and scrotal swelling.  Musculoskeletal: Positive for myalgias. Negative for back pain.  Skin: Positive for rash.  Neurological: Negative for tremors and seizures.  Hematological: Negative for adenopathy.  Psychiatric/Behavioral: Negative for confusion.     Physical Exam Updated Vital Signs BP (!) 169/94 (BP Location: Right Arm)   Pulse (!) 113   Temp 99.3 F (37.4 C) (Oral)   Resp 20   Ht 5\' 10"  (1.778 m)   Wt 65.8 kg (145 lb)   SpO2 99%   BMI 20.81 kg/m   Physical Exam  Constitutional: He is oriented to person, place, and time. He appears well-developed.  HENT:  Mild posterior pharyngeal erythema without exudate. A few scattered vesicles.  Eyes: Pupils are equal, round, and reactive to light. EOM are normal.  Neck: Neck supple.  Mild left-sided anterior cervical lymphadenopathy.  Cardiovascular:  Mild tachycardia  Pulmonary/Chest: Effort normal.  Abdominal: Soft. There is no tenderness.  Musculoskeletal: He exhibits no edema.  Neurological: He is alert and oriented to person, place, and time.  Skin: Skin is warm. Capillary refill takes less than 2 seconds.  Somewhat diffuse maculopapular rash. Worse on the upper extremites. No fluctuance. 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 The rash spares his palms, soles and mucous membranes. Is on his trunk and back also.     ED Treatments / Results  Labs (all labs ordered are listed, but only abnormal results are displayed) Labs Reviewed  CBC WITH DIFFERENTIAL/PLATELET - Abnormal; Notable for the following:       Result Value   WBC 14.3 (*)    RBC 4.04 (*)      Neutro Abs 10.9 (*)    All other components within normal limits  COMPREHENSIVE METABOLIC PANEL - Abnormal; Notable for the following:    ALT 15 (*)    All other components within normal limits  RAPID STREP SCREEN (NOT AT [facility])  CULTURE, BLOOD (ROUTINE X 2)  CULTURE, BLOOD (ROUTINE X 2)  TROPONIN I  LACTIC ACID, PLASMA  URINALYSIS, ROUTINE W REFLEX MICROSCOPIC    EKG  EKG Interpretation None       Radiology Dg Chest 2 View  Result Date: 06/08/2017 CLINICAL DATA:  Mild shortness of breath. Weakness and fever and chills this evening. EXAM: CHEST  2 VIEW COMPARISON:  12/21/2015. FINDINGS: Normal sized heart. Clear lungs. The lungs are mildly hyperexpanded with mild diffuse peribronchial thickening and accentuation of the interstitial markings. Cervical spine fixation hardware and minimal thoracic spine degenerative changes. IMPRESSION: No acute abnormality. Stable mild changes of COPD and chronic bronchitis. Electronically Signed   By: Claudie Revering M.D.   On: 06/08/2017 21:19    Procedures Procedures (including critical care time)  Medications Ordered in ED Medications  sodium chloride 0.9 % bolus 1,000 mL (not administered)  sodium chloride 0.9 % bolus 1,000 mL (1,000 mLs Intravenous New Bag/Given 06/08/17 2120)     Initial Impression / Assessment and Plan / ED Course  I have reviewed the triage vital signs and the nursing notes.  Pertinent labs & imaging results that were available during my care of the patient were reviewed by me and considered in my medical decision making (see chart for details).     Patient with fever. Without a clear cause. Doubt meningitis. Does however have this rash. Is currently on steroids. Lactic acid reassuring white count mildly elevated. Chest x-ray showed possible bronchitis. Will have patient evaluated by internal medicine hospitalist.  Final Clinical Impressions(s) / ED Diagnoses   Final diagnoses:  Fever, unspecified fever cause  Rash     New Prescriptions New Prescriptions   No medications on file     Davonna Belling, MD 06/08/17 2133

## 2017-06-08 NOTE — ED Notes (Signed)
ED Provider at bedside. 

## 2017-06-08 NOTE — ED Triage Notes (Signed)
Reports of fever and chills that started this evening. States he has been seen for a rash since january.

## 2017-06-08 NOTE — H&P (Signed)
History and Physical    DEADRIAN TOYA [name]:[id number] DOB: 1956/05/08 DOA: 06/08/2017  PCP: Sinda Du, MD   Patient coming from: Home.  I have personally briefly reviewed patient's old medical records in [place]  Chief Complaint: Fever and rash.  HPI: Jaime [name] is a 61 y.o. male with medical history significant of anxiety, BPH, chronic cervical and lumbar spine pain, chronic constipation, emphysema, tobacco use disorder who is coming to the emergency department with complaints of fever since about 1600 today associated with chills, mild malaise and decreased appetite. He also mentions feeling occasional lightheaded when getting up from bed or standing up from a chair. He denies headaches, but states that his sinuses are feeling tight, denies earaches, rhinorrhea, sore throat, increasing sputum production, hemoptysis, chest pain, palpitations, diaphoresis, PND, orthopnea, pitting edema of the lower extremities, abdominal pain, nausea, emesis, diarrhea, melena or hematochezia. He denies dysuria, frequency or hematuria. He denies polyphagia, polydipsia, polyuria or blurred vision.  He also complains of chronic rash since January, for which he has been seen multiple times. He initially denied any other household member having symptoms, but subsequently his significant other came to the room and showed me a milder rash, when compared to the patient's, but she states that she has had it for the past 3 months. He was recently seen in the ER last month with a negative HIV and RPR test. He has use permethrin recently, which he states improved his rash significantly, but no other household member used it. He has also been using prednisone and hydroxyzine to treat his itching. They were instructed to use at the same time, and given the extensive rash that the patient have, I suggested a third application of permethrin on day 28. They should also take care of clothes, bedding and towels.  ED  Course: Vital signs in the emergency department initially were temperature 101.43F, pulse 113, blood pressure 169/94 mmHg, respirations 20 and O2 sat 99% on room air.   Medications: He received a 2000 mL normal saline bolus, 650 mg of acetaminophen, 1 mg of lorazepam and 10 mg of hydrocodone orally.  Workup reveals WBC of 14.3 with 76% after, hemoglobin 13.3 g/dL and platelets 167. His CMP was normal. Lactic acid was 0.9. Troponin was normal. Rapid strep test was negative. Blood cultures 2 were taken.   Imaging: His chest radiograph showed stable mild COPD and chronic bronchitis.  Review of Systems: As per HPI otherwise 10 point review of systems negative.    Past Medical History:  Diagnosis Date  . Anxiety   . BPH (benign prostatic hyperplasia)   . Chest pain   . Chronic pain    neck  . Constipation   . Genital herpes   . 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 Tobacco abuse     Past Surgical History:  Procedure Laterality Date  . CHOLECYSTECTOMY  1982  . chordoma  1996  . COLONOSCOPY  2001   pt reports [facility] at [facility]  . COLONOSCOPY  05/30/2012   Procedure: COLONOSCOPY;  Surgeon: Daneil Dolin, MD;  Location: [facility];  Service: Endoscopy;  Laterality: N/A;  10:45  . MASTOIDECTOMY  1991  . NECK SURGERY     fracture C3-7     reports that he has been smoking Cigarettes.  He has a 88.00 pack-year smoking history. He has never used smokeless tobacco. He reports that he does not drink alcohol or use  drugs.  Allergies  Allergen Reactions  . Celebrex [Celecoxib] Itching    Hot Flashes  . Ciprofloxacin Hives  . Contrast Media [Iodinated Diagnostic Agents]     Hypotension  . Dilaudid [Hydromorphone] Other (See Comments)    hallunications  . Latex Hives  . Morphine And Related     Panic Attack  . Sulfa Antibiotics Other (See Comments)    Hot Flashes    Family History  Problem Relation Age of Onset  . Hypertension Mother     . Heart disease Father   . Lung cancer Father   . Heart disease Sister   . Lung cancer Brother     Prior to Admission medications   Medication Sig Start Date End Date Taking? Authorizing Provider  acetaminophen (TYLENOL) 500 MG tablet Take 500 mg by mouth every 6 (six) hours as needed for mild pain or moderate pain.    [provider]  ALPRAZolam Duanne Moron) 1 MG tablet Take 1 mg by mouth 4 (four) times daily as needed. For nerves     [provider]  aspirin EC 81 MG tablet Take 81 mg by mouth every other day.     [provider]  diphenhydramine-acetaminophen (TYLENOL PM) 25-500 MG TABS tablet Take 1 tablet by mouth at bedtime as needed (sleep).    [provider]  HYDROcodone-acetaminophen (NORCO) 10-325 MG per tablet Take 1 tablet by mouth 4 (four) times daily.    [provider]  hydrOXYzine (ATARAX/VISTARIL) 25 MG tablet Take 1 tablet (25 mg total) by mouth every 8 (eight) hours as needed for itching. 01/08/17   Malvin Johns, MD  ibuprofen (ADVIL,MOTRIN) 200 MG tablet Take 200 mg by mouth every 6 (six) hours as needed for moderate pain.    [provider]  meloxicam (MOBIC) 7.5 MG tablet Take 7.5 mg by mouth 2 (two) times daily.  07/10/14   [provider]  permethrin (ELIMITE) 5 % cream Apply to affected area once 01/08/17   Malvin Johns, MD  polyethylene glycol powder (GLYCOLAX/MIRALAX) powder Take 17 g by mouth daily.    [provider]  predniSONE (DELTASONE) 10 MG tablet Take 1 tablet (10 mg total) by mouth daily. Take 6 tablets for 3 days. After that take 1 tablet a day. 05/20/17   Volanda Napoleon, PA-C  tamsulosin (FLOMAX) 0.4 MG CAPS capsule Take 0.4 mg by mouth daily.    [provider]    Physical Exam: Vitals:   06/08/17 2200 06/08/17 2215 06/08/17 2215 06/08/17 2234  BP: 140/82   (!) 160/82  Pulse: 92 90  95  Resp:  18  16  Temp:   100.3 F (37.9 C) 99.7 F (37.6 C)  TempSrc:   Oral Oral   SpO2: 97% 94%  99%  Weight:    68.3 kg (150 lb 9.6 oz)  Height:    5\' 10"  (1.778 m)    Constitutional: Mildly febrile, but otherwise NAD, calm, comfortable Eyes: PERRL, lids and conjunctivae normal ENMT: Mucous membranes are moist. Posterior pharynx clear of any exudate or lesions. Dentition is partially absent and in poor state of repair. Neck: normal, supple, no masses, no thyromegaly Respiratory: clear to auscultation bilaterally, no wheezing, no crackles. Normal respiratory effort. No accessory muscle use.  Cardiovascular: Regular rate and rhythm, no murmurs / rubs / gallops. No extremity edema. 2+ pedal pulses. No carotid bruits.  Abdomen: Soft, no tenderness, no masses palpated. No hepatosplenomegaly. Bowel sounds positive.  Musculoskeletal: no clubbing / cyanosis.  Good ROM, no contractures. Normal muscle tone.  Skin: Extensive erythematous rash with multiple excoriations, particularly of gluteal area. Neurologic: CN 2-12 grossly intact. Sensation intact, DTR normal. Strength 5/5 in all 4.  Psychiatric: Normal judgment and insight. Alert and oriented x 4. Normal mood.            Labs on Admission: I have personally reviewed following labs and imaging studies  CBC:  Recent Labs Lab 06/08/17 2041  WBC 14.3*  NEUTROABS 10.9*  HGB 13.3  HCT 39.8  MCV 98.5  PLT 017   Basic Metabolic Panel:  Recent Labs Lab 06/08/17 2041  NA 137  K 3.9  CL 101  CO2 30  GLUCOSE 96  BUN 10  CREATININE 0.73  CALCIUM 9.2   GFR: Estimated Creatinine Clearance: 92.5 mL/min (by C-G formula based on SCr of 0.73 mg/dL). Liver Function Tests:  Recent Labs Lab 06/08/17 2041  AST 15  ALT 15*  ALKPHOS 61  BILITOT 0.8  PROT 7.1  ALBUMIN 3.8   No results for input(s): LIPASE, AMYLASE in the last 168 hours. No results for input(s): AMMONIA in the last 168 hours. Coagulation Profile: No results for input(s): INR, PROTIME in the last 168 hours. Cardiac Enzymes:  Recent  Labs Lab 06/08/17 2041  TROPONINI <0.03   BNP (last 3 results) No results for input(s): PROBNP in the last 8760 hours. HbA1C: No results for input(s): HGBA1C in the last 72 hours. CBG: No results for input(s): GLUCAP in the last 168 hours. Lipid Profile: No results for input(s): CHOL, HDL, LDLCALC, TRIG, CHOLHDL, LDLDIRECT in the last 72 hours. Thyroid Function Tests: No results for input(s): TSH, T4TOTAL, FREET4, T3FREE, THYROIDAB in the last 72 hours. Anemia Panel: No results for input(s): VITAMINB12, FOLATE, FERRITIN, TIBC, IRON, RETICCTPCT in the last 72 hours. Urine analysis:    Component Value Date/Time   COLORURINE YELLOW 06/08/2017 2042   APPEARANCEUR CLEAR 06/08/2017 2042   LABSPEC 1.013 06/08/2017 2042   PHURINE 7.0 06/08/2017 2042   GLUCOSEU NEGATIVE 06/08/2017 2042   HGBUR SMALL (A) 06/08/2017 2042   BILIRUBINUR NEGATIVE 06/08/2017 2042   KETONESUR NEGATIVE 06/08/2017 2042   PROTEINUR NEGATIVE 06/08/2017 2042   UROBILINOGEN 0.2 02/05/2009 1103   NITRITE NEGATIVE 06/08/2017 2042   LEUKOCYTESUR NEGATIVE 06/08/2017 2042    Radiological Exams on Admission: Dg Chest 2 View  Result Date: 06/08/2017 CLINICAL DATA:  Mild shortness of breath. Weakness and fever and chills this evening. EXAM: CHEST  2 VIEW COMPARISON:  12/21/2015. FINDINGS: Normal sized heart. Clear lungs. The lungs are mildly hyperexpanded with mild diffuse peribronchial thickening and accentuation of the interstitial markings. Cervical spine fixation hardware and minimal thoracic spine degenerative changes. IMPRESSION: No acute abnormality. Stable mild changes of COPD and chronic bronchitis. Electronically Signed   By: Claudie Revering M.D.   On: 06/08/2017 21:19    EKG: Independently reviewed.   Assessment/Plan Principal Problem:   Fever Possibly due to viral illness, since workup so far is negative. Continue IV fluids. Will defer IV antibiotics for the moment. Follow-up CBC with differential in  a.m. Follow-up blood cultures and sensitivity.  Active Problems:   Chronic pruritic rash in adult Multiple skin excoriations, but no area of cellulitis identified. His significant other has been having pruritic rash for the past 3 months as well.  This is likely a scabies or I have advised her to use permethrin as well and to follow-up with her PCP. Given the stent of this condition in both the patient  and his girlfriend, I have advised him to start treatment simultaneously and repeat after 14 and 28 days to ensure proper treatment. His sister and his son, despite not having symptoms should probably be treated as well. They should wash her clothes, bedding and towels with hot water and then dry them with a hot dryer. Any other it tends that are unable to be washed, but are in close proximity to them should be placed in a sealed bag for 7 days.    Tobacco abuse Declined nicotine replacement therapy. Smoking cessation information to be provided prior to this chart.    Chronic constipation Continue MiraLAX as needed.    Leukocytosis The patient has been using prednisone recently. Recheck CBC with differential in the morning.    BPH (benign prostatic hyperplasia) Continue Flomax daily.    Chronic pain Continue Norco 10/325 milligrams orally every 6 hours as needed. Hydrocodone may be worsening his pruritus symptoms.    Anxiety Continue alprazolam 1 mg by mouth 4 times a day as needed.     DVT prophylaxis: Lovenox SQ. Code Status: Full code. Family Communication: His significant other and his sister were present in th Disposition Plan: Observation for further evaluation of fever and rash. Consults called:  Admission status: Observation/Telemetry.   Reubin Milan MD [facility] Pager [phone]  If 7PM-7AM, please contact night-coverage [email or web address] Password [facility]  06/08/2017, 10:46 PM

## 2017-06-08 NOTE — ED Notes (Signed)
Pt and pt's family in room refusing second set of blood cultures, one set has been drawn

## 2017-06-08 NOTE — ED Notes (Signed)
EDP made aware of pt's refusal for second set of blood cultures

## 2017-06-09 DIAGNOSIS — Z720 Tobacco use: Secondary | ICD-10-CM | POA: Diagnosis not present

## 2017-06-09 DIAGNOSIS — K5909 Other constipation: Secondary | ICD-10-CM | POA: Diagnosis not present

## 2017-06-09 DIAGNOSIS — D72829 Elevated white blood cell count, unspecified: Secondary | ICD-10-CM

## 2017-06-09 DIAGNOSIS — L298 Other pruritus: Secondary | ICD-10-CM | POA: Diagnosis present

## 2017-06-09 DIAGNOSIS — B86 Scabies: Secondary | ICD-10-CM | POA: Diagnosis not present

## 2017-06-09 DIAGNOSIS — L2989 Other pruritus: Secondary | ICD-10-CM | POA: Diagnosis present

## 2017-06-09 DIAGNOSIS — R509 Fever, unspecified: Principal | ICD-10-CM

## 2017-06-09 DIAGNOSIS — J439 Emphysema, unspecified: Secondary | ICD-10-CM | POA: Diagnosis present

## 2017-06-09 LAB — CBC WITH DIFFERENTIAL/PLATELET
Basophils Absolute: 0 10*3/uL (ref 0.0–0.1)
Basophils Relative: 0 %
Eosinophils Absolute: 0.3 10*3/uL (ref 0.0–0.7)
Eosinophils Relative: 3 %
HEMATOCRIT: 34.5 % — AB (ref 39.0–52.0)
HEMOGLOBIN: 11.4 g/dL — AB (ref 13.0–17.0)
LYMPHS ABS: 3.1 10*3/uL (ref 0.7–4.0)
LYMPHS PCT: 31 %
MCH: 33.1 pg (ref 26.0–34.0)
MCHC: 33 g/dL (ref 30.0–36.0)
MCV: 100.3 fL — ABNORMAL HIGH (ref 78.0–100.0)
MONOS PCT: 6 %
Monocytes Absolute: 0.6 10*3/uL (ref 0.1–1.0)
NEUTROS ABS: 5.8 10*3/uL (ref 1.7–7.7)
NEUTROS PCT: 60 %
Platelets: 148 10*3/uL — ABNORMAL LOW (ref 150–400)
RBC: 3.44 MIL/uL — AB (ref 4.22–5.81)
RDW: 13.7 % (ref 11.5–15.5)
WBC: 9.8 10*3/uL (ref 4.0–10.5)

## 2017-06-09 LAB — BASIC METABOLIC PANEL
Anion gap: 5 (ref 5–15)
BUN: 7 mg/dL (ref 6–20)
CHLORIDE: 108 mmol/L (ref 101–111)
CO2: 27 mmol/L (ref 22–32)
CREATININE: 0.63 mg/dL (ref 0.61–1.24)
Calcium: 8.2 mg/dL — ABNORMAL LOW (ref 8.9–10.3)
GFR calc Af Amer: >60 mL/min (ref >60)
GFR calc non Af Amer: >60 mL/min (ref >60)
Glucose, Bld: 97 mg/dL (ref 65–99)
POTASSIUM: 3.8 mmol/L (ref 3.5–5.1)
SODIUM: 140 mmol/L (ref 135–145)

## 2017-06-09 LAB — PROCALCITONIN: Procalcitonin: <0.1 ng/mL

## 2017-06-09 MED ORDER — HYDROXYZINE HCL 25 MG PO TABS
25.0000 mg | ORAL_TABLET | ORAL | Status: DC | PRN
  Administered 2017-06-09: 25 mg via ORAL
  Filled 2017-06-09: qty 1

## 2017-06-09 MED ORDER — POLYETHYLENE GLYCOL 3350 17 G PO PACK
17.0000 g | PACK | Freq: Every day | ORAL | Status: DC
  Administered 2017-06-09: 17 g via ORAL
  Filled 2017-06-09: qty 1

## 2017-06-09 MED ORDER — IVERMECTIN 3 MG PO TABS
200.0000 ug/kg | ORAL_TABLET | Freq: Once | ORAL | Status: AC
  Administered 2017-06-09: 13500 ug via ORAL
  Filled 2017-06-09: qty 5

## 2017-06-09 MED ORDER — IVERMECTIN 3 MG PO TABS: ORAL_TABLET | ORAL | 0 refills | Status: DC

## 2017-06-09 NOTE — Care Management Note (Signed)
Case Management Note  Patient Details  Name: Jaime [name] MRN: [id number] Date of Birth: 19-Apr-1956  Subjective/Objective:                  Admitted from home with viral illness and scabies. CM consult received for [facility] services. Lives with family. Pt ind with ADL's. He has PCP. He has insurance with drug coverage. He is ambulatory and not homebound. HH not ordered and not appropriate.   Action/Plan: DC home today with self care.   Expected Discharge Date:  06/09/17               Expected Discharge Plan:  Home/Self Care  In-House Referral:  NA  Discharge planning Services  CM Consult  Post Acute Care Choice:  NA Choice offered to:  NA  Status of Service:  Completed, signed off  Sherald Barge, RN 06/09/2017, 2:43 PM

## 2017-06-09 NOTE — Discharge Instructions (Signed)
Scabies, Adult Scabies is a skin condition that happens when very small insects get under the skin (infestation). This causes a rash and severe itchiness. Scabies can spread from person to person (is contagious). If you get scabies, it is common for others in your household to get scabies too. With proper treatment, symptoms usually go away in 2-4 weeks. Scabies usually does not cause lasting problems. What are the causes? This condition is caused by mites (Sarcoptes scabiei, or human itch mites) that can only be seen with a microscope. The mites get into the top layer of skin and lay eggs. Scabies can spread from person to person through:  Close contact with a person who has scabies.  Contact with infested items, such as towels, bedding, or clothing.  What increases the risk? This condition is more likely to develop in:  People who live in nursing homes and other extended-care facilities.  People who have sexual contact with a partner who has scabies.  Young children who attend child care facilities.  People who care for others who are at increased risk for scabies.  What are the signs or symptoms? Symptoms of this condition may include:  Severe itchiness. This is often worse at night.  A rash that includes tiny red bumps or blisters. The rash commonly occurs on the wrist, elbow, armpit, fingers, waist, groin, or buttocks. Bumps may form a line (burrow) in some areas.  Skin irritation. This can include scaly patches or sores.  How is this diagnosed? This condition is diagnosed with a physical exam. Your health care provider will look closely at your skin. In some cases, your health care provider may take a sample of your affected skin (skin scraping) and have it examined under a microscope. How is this treated? This condition may be treated with:  Medicated cream or lotion that kills the mites. This is spread on the entire body and left on for several hours. Usually, one treatment  with medicated cream or lotion is enough to kill all of the mites. In severe cases, the treatment may be repeated.  Medicated cream that relieves itching.  Medicines that help to relieve itching.  Medicines that kill the mites. This treatment is rarely used.  Follow these instructions at home:  Medicines  Take or apply over-the-counter and prescription medicines as told by your health care provider.  Apply medicated cream or lotion as told by your health care provider.  Do not wash off the medicated cream or lotion until the necessary amount of time has passed. Skin Care  Avoid scratching your affected skin.  Keep your fingernails closely trimmed to reduce injury from scratching.  Take cool baths or apply cool washcloths to help reduce itching. General instructions  Clean all items that you recently had contact with, including bedding, clothing, and furniture. Do this on the same day that your treatment starts. ? Use hot water when you wash items. ? Place unwashable items into closed, airtight plastic bags for at least 3 days. The mites cannot live for more than 3 days away from human skin. ? Vacuum furniture and mattresses that you use.  Make sure that other people who may have been infested are examined by a health care provider. These include members of your household and anyone who may have had contact with infested items.  Keep all follow-up visits as told by your health care provider. This is important. Contact a health care provider if:  You have itching that does not go away   after 4 weeks of treatment.  You continue to develop new bumps or burrows.  You have redness, swelling, or pain in your rash area after treatment.  You have fluid, blood, or pus coming from your rash. This information is not intended to replace advice given to you by your health care provider. Make sure you discuss any questions you have with your health care provider. Document Released:  07/15/2015 Document Revised: 03/31/2016 Document Reviewed: 05/26/2015 Elsevier Interactive Patient Education  2018 [employer]  

## 2017-06-09 NOTE — Discharge Summary (Addendum)
Discharge Summary  Jaime [name] JEH:[id number] DOB: [birth year]  PCP: Sinda Du, MD  Admit date: 06/08/2017 Discharge date: 06/09/2017  Time spent: 25 minutes   8/4 Addendum: Patient discharged on 8/3. On 8/4, lab services called on blood cultures drawn on 8/2 saying anaerobic bottle only positive for gram-positive cocci. No further determination yet. I called patient at home. He is not having any fevers although he still complains of severe itching. Will call in prescription for Augmentin as well as Vistaril to [facility]. Spoke with lab services who will call me back personally tomorrow and Monday to further delineate positive blood culture. Have high suspicion this is contaminant.  Recommendations for Outpatient Follow-up:  1. New medication: Ivermectin 14 mg by mouth times once on 8/10 2. Patient and girlfriend given information about decontamination and importance of treating all people they live with   Discharge Diagnoses:  Active Hospital Problems   Diagnosis Date Noted  . Fever 06/08/2017  . 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 Leukocytosis 06/08/2017  . BPH (benign prostatic hyperplasia) 06/08/2017  . Chronic pain 06/08/2017  . Chronic constipation 05/11/2012  . Tobacco abuse 12/14/2011    Resolved Hospital Problems   Diagnosis Date Noted Date Resolved  No resolved problems to display.    Discharge Condition: Improved, being discharged home   Diet recommendation: Regular diet   Vitals:   06/09/17 0501 06/09/17 1300  BP: 126/75 135/78  Pulse: 73 78  Resp: 16 18  Temp: 97.8 F (36.6 C) 98.2 F (36.8 C)    History of present illness:   61 year old male with past medical history of COPD/chronic emphysema plus tobacco abuse with recurrent rash that has been diagnosed as scabies presented to the emergency room on 8/2 evening with complaints of fever, chills and recurrent pruritic rash again. In the emergency room, found to have temperature of  101.2 and white count of 14.3 (although on steroids).  Chest x-ray and urine unremarkable. Troponin unremarkable. Lactic acid level normal. Patient brought in for observation  Hospital Course:  Principal Problem:   Fever: Viral illness versus bacterial translocation from gut from chronic constipation. Patient moved bowels on 8/3. No further fevers following hospitalization. Pro calcitonin level checked found to be normal. Active Problems:   Tobacco abuse: Counseled. Declined nicotine patch   Chronic constipation: Moved bowels on 8/3   Leukocytosis: Resolved by 8/3 without antibiotic intervention. Felt to be secondary to steroids   BPH (benign prostatic hyperplasia)   Chronic pain   Chronic pruritic rash in adult/scabies: Treated with 40 mg of ivermectin, weight-based dosed. Recommendations are to repeat this treatment in 1 week. Patient and girlfriend also given information about decontamination   Procedures:  None   Consultations:  None   Discharge Exam: BP 135/78 (BP Location: Right Arm)   Pulse 78   Temp 98.2 F (36.8 C) (Oral)   Resp 18   Ht 5\' 10"  (1.778 m)   Wt 68.3 kg (150 lb 9.6 oz)   SpO2 99%   BMI 21.61 kg/m   General: Alert and oriented 3, no acute distress  Cardiovascular: Regular rate and rhythm, S1-S2  Respiratory: Clear to auscultation bilaterally   Discharge Instructions You were cared for by a hospitalist during your hospital stay. If you have any questions about your discharge medications or the care you received while you were in the hospital after you are discharged, you can call the unit and asked to speak with the hospitalist on call if the hospitalist  that took care of you is not available. Once you are discharged, your primary care physician will handle any further medical issues. Please note that NO REFILLS for any discharge medications will be authorized once you are discharged, as it is imperative that you return to your primary care physician (or  establish a relationship with a primary care physician if you do not have one) for your aftercare needs so that they can reassess your need for medications and monitor your lab values.  Discharge Instructions    Diet - low sodium heart healthy    Complete by:  As directed    Increase activity slowly    Complete by:  As directed      Allergies as of 06/09/2017      Reactions   Celebrex [celecoxib] Itching   Hot Flashes   Ciprofloxacin Hives   Contrast Media [iodinated Diagnostic Agents]    Hypotension   Dilaudid [hydromorphone] Other (See Comments)   hallunications   Latex Hives   Morphine And Related    Panic Attack   Sulfa Antibiotics Other (See Comments)   Hot Flashes      Medication List    TAKE these medications   acetaminophen 500 MG tablet Commonly known as:  TYLENOL Take 500 mg by mouth every 6 (six) hours as needed for mild pain or moderate pain.   ALPRAZolam 1 MG tablet Commonly known as:  XANAX Take 1 mg by mouth 4 (four) times daily as needed. For nerves   aspirin EC 81 MG tablet Take 81 mg by mouth every other day.   diphenhydramine-acetaminophen 25-500 MG Tabs tablet Commonly known as:  TYLENOL PM Take 1 tablet by mouth at bedtime as needed (sleep).   HYDROcodone-acetaminophen 10-325 MG tablet Commonly known as:  NORCO Take 1 tablet by mouth 4 (four) times daily.   hydrOXYzine 25 MG tablet Commonly known as:  ATARAX/VISTARIL Take 1 tablet (25 mg total) by mouth every 8 (eight) hours as needed for itching.   ibuprofen 200 MG tablet Commonly known as:  ADVIL,MOTRIN Take 200 mg by mouth every 6 (six) hours as needed for moderate pain.   ivermectin 3 MG Tabs tablet Commonly known as:  STROMECTOL Take 4 and 1/2 tablets on 8/10   meloxicam 7.5 MG tablet Commonly known as:  MOBIC Take 1 tablet by mouth 2 (two) times daily.   polyethylene glycol powder powder Commonly known as:  GLYCOLAX/MIRALAX Take 17 g by mouth daily as needed for moderate  constipation.   predniSONE 10 MG tablet Commonly known as:  DELTASONE Take 1 tablet (10 mg total) by mouth daily. Take 6 tablets for 3 days. After that take 1 tablet a day.   tamsulosin 0.4 MG Caps capsule Commonly known as:  FLOMAX Take 0.4 mg by mouth daily.      Allergies  Allergen Reactions  . Celebrex [Celecoxib] Itching    Hot Flashes  . Ciprofloxacin Hives  . Contrast Media [Iodinated Diagnostic Agents]     Hypotension  . Dilaudid [Hydromorphone] Other (See Comments)    hallunications  . Latex Hives  . Morphine And Related     Panic Attack  . Sulfa Antibiotics Other (See Comments)    Hot Flashes      The results of significant diagnostics from this hospitalization (including imaging, microbiology, ancillary and laboratory) are listed below for reference.    Significant Diagnostic Studies: Dg Chest 2 View  Result Date: 06/08/2017 CLINICAL DATA:  Mild shortness of breath. Weakness and  fever and chills this evening. EXAM: CHEST  2 VIEW COMPARISON:  12/21/2015. FINDINGS: Normal sized heart. Clear lungs. The lungs are mildly hyperexpanded with mild diffuse peribronchial thickening and accentuation of the interstitial markings. Cervical spine fixation hardware and minimal thoracic spine degenerative changes. IMPRESSION: No acute abnormality. Stable mild changes of COPD and chronic bronchitis. Electronically Signed   By: Claudie Revering M.D.   On: 06/08/2017 21:19    Microbiology: Recent Results (from the past 240 hour(s))  Rapid strep screen     Status: None   Collection Time: 06/08/17  8:42 PM  Result Value Ref Range Status   Streptococcus, Group A Screen (Direct) NEGATIVE NEGATIVE Final    Comment: (NOTE) A Rapid Antigen test may result negative if the antigen level in the sample is below the detection level of this test. The FDA has not cleared this test as a stand-alone test therefore the rapid antigen negative result has reflexed to a Group A Strep culture.     Culture, blood (routine x 2)     Status: None (Preliminary result)   Collection Time: 06/08/17  9:11 PM  Result Value Ref Range Status   Specimen Description RIGHT ANTECUBITAL  Final   Special Requests   Final    BOTTLES DRAWN AEROBIC AND ANAEROBIC Blood Culture adequate volume   Culture NO GROWTH < 12 HOURS  Final   Report Status PENDING  Incomplete     Labs: Basic Metabolic Panel:  Recent Labs Lab 06/08/17 2041 06/09/17 0700  NA 137 140  K 3.9 3.8  CL 101 108  CO2 30 27  GLUCOSE 96 97  BUN 10 7  CREATININE 0.73 0.63  CALCIUM 9.2 8.2*   Liver Function Tests:  Recent Labs Lab 06/08/17 2041  AST 15  ALT 15*  ALKPHOS 61  BILITOT 0.8  PROT 7.1  ALBUMIN 3.8   No results for input(s): LIPASE, AMYLASE in the last 168 hours. No results for input(s): AMMONIA in the last 168 hours. CBC:  Recent Labs Lab 06/08/17 2041 06/09/17 0700  WBC 14.3* 9.8  NEUTROABS 10.9* 5.8  HGB 13.3 11.4*  HCT 39.8 34.5*  MCV 98.5 100.3*  PLT 167 148*   Cardiac Enzymes:  Recent Labs Lab 06/08/17 2041  TROPONINI <0.03   BNP: BNP (last 3 results) No results for input(s): BNP in the last 8760 hours.  ProBNP (last 3 results) No results for input(s): PROBNP in the last 8760 hours.  CBG: No results for input(s): GLUCAP in the last 168 hours.     Signed:  Annita Brod, MD [facility] 06/09/2017, 2:04 PM

## 2017-06-09 NOTE — Progress Notes (Signed)
Patient is to be discharged home and in stable condition. Patient and significant other given discharge instructions and verbalized understanding. Patient is awaiting transportation, and will be escorted out by staff via wheelchair.  Celestia Khat, RN

## 2017-06-09 NOTE — Progress Notes (Signed)
Educated pt on inititiation of precautions on 8/2, educated pt again and spouse this a.m. On purpose of precautions and the ways pt and his spouse could prevent spread.

## 2017-06-10 LAB — BLOOD CULTURE ID PANEL (REFLEXED)
ACINETOBACTER BAUMANNII: NOT DETECTED
CANDIDA ALBICANS: NOT DETECTED
CANDIDA GLABRATA: NOT DETECTED
CANDIDA PARAPSILOSIS: NOT DETECTED
CANDIDA TROPICALIS: NOT DETECTED
Candida krusei: NOT DETECTED
ENTEROBACTER CLOACAE COMPLEX: NOT DETECTED
ENTEROBACTERIACEAE SPECIES: NOT DETECTED
Enterococcus species: NOT DETECTED
Escherichia coli: NOT DETECTED
Haemophilus influenzae: NOT DETECTED
KLEBSIELLA OXYTOCA: NOT DETECTED
KLEBSIELLA PNEUMONIAE: NOT DETECTED
Listeria monocytogenes: NOT DETECTED
Neisseria meningitidis: NOT DETECTED
Proteus species: NOT DETECTED
Pseudomonas aeruginosa: NOT DETECTED
Serratia marcescens: NOT DETECTED
Staphylococcus aureus (BCID): NOT DETECTED
Staphylococcus species: NOT DETECTED
Streptococcus agalactiae: NOT DETECTED
Streptococcus pneumoniae: NOT DETECTED
Streptococcus pyogenes: NOT DETECTED
Streptococcus species: NOT DETECTED

## 2017-06-11 LAB — CULTURE, GROUP A STREP (THRC)

## 2017-06-12 LAB — CULTURE, BLOOD (ROUTINE X 2): SPECIAL REQUESTS: ADEQUATE

## 2017-06-13 DIAGNOSIS — B880 Other acariasis: Secondary | ICD-10-CM | POA: Diagnosis not present

## 2017-06-13 DIAGNOSIS — G8250 Quadriplegia, unspecified: Secondary | ICD-10-CM | POA: Diagnosis not present

## 2017-06-13 DIAGNOSIS — M545 Low back pain: Secondary | ICD-10-CM | POA: Diagnosis not present

## 2017-06-13 DIAGNOSIS — J449 Chronic obstructive pulmonary disease, unspecified: Secondary | ICD-10-CM | POA: Diagnosis not present

## 2017-06-14 ENCOUNTER — Ambulatory Visit: Payer: Medicare Other | Admitting: Nurse Practitioner

## 2017-06-19 ENCOUNTER — Encounter (HOSPITAL_COMMUNITY): Payer: Self-pay

## 2017-06-19 ENCOUNTER — Emergency Department (HOSPITAL_COMMUNITY)
Admission: EM | Admit: 2017-06-19 | Discharge: 2017-06-19 | Disposition: A | Discharge status: Home / Self Care | Payer: Medicare Other | Attending: Emergency Medicine | Admitting: Emergency Medicine

## 2017-06-19 DIAGNOSIS — Z91040 Latex allergy status: Secondary | ICD-10-CM | POA: Diagnosis not present

## 2017-06-19 DIAGNOSIS — F17210 Nicotine dependence, cigarettes, uncomplicated: Secondary | ICD-10-CM | POA: Insufficient documentation

## 2017-06-19 DIAGNOSIS — R21 Rash and other nonspecific skin eruption: Secondary | ICD-10-CM | POA: Diagnosis not present

## 2017-06-19 DIAGNOSIS — Z79899 Other long term (current) drug therapy: Secondary | ICD-10-CM | POA: Diagnosis not present

## 2017-06-19 DIAGNOSIS — Z7982 Long term (current) use of aspirin: Secondary | ICD-10-CM | POA: Diagnosis not present

## 2017-06-19 LAB — BASIC METABOLIC PANEL
Anion gap: 8 (ref 5–15)
BUN: 7 mg/dL (ref 6–20)
CHLORIDE: 100 mmol/L — AB (ref 101–111)
CO2: 27 mmol/L (ref 22–32)
CREATININE: 0.82 mg/dL (ref 0.61–1.24)
Calcium: 8.8 mg/dL — ABNORMAL LOW (ref 8.9–10.3)
GFR calc Af Amer: >60 mL/min (ref >60)
GFR calc non Af Amer: >60 mL/min (ref >60)
Glucose, Bld: 97 mg/dL (ref 65–99)
POTASSIUM: 3.8 mmol/L (ref 3.5–5.1)
Sodium: 135 mmol/L (ref 135–145)

## 2017-06-19 LAB — CBC WITH DIFFERENTIAL/PLATELET
Basophils Absolute: 0 10*3/uL (ref 0.0–0.1)
Basophils Relative: 0 %
Eosinophils Absolute: 0.3 10*3/uL (ref 0.0–0.7)
Eosinophils Relative: 3 %
HEMATOCRIT: 40.5 % (ref 39.0–52.0)
HEMOGLOBIN: 13.5 g/dL (ref 13.0–17.0)
LYMPHS ABS: 2.6 10*3/uL (ref 0.7–4.0)
LYMPHS PCT: 28 %
MCH: 32.7 pg (ref 26.0–34.0)
MCHC: 33.3 g/dL (ref 30.0–36.0)
MCV: 98.1 fL (ref 78.0–100.0)
MONOS PCT: 10 %
Monocytes Absolute: 0.9 10*3/uL (ref 0.1–1.0)
NEUTROS ABS: 5.2 10*3/uL (ref 1.7–7.7)
NEUTROS PCT: 59 %
Platelets: 212 10*3/uL (ref 150–400)
RBC: 4.13 MIL/uL — AB (ref 4.22–5.81)
RDW: 13.4 % (ref 11.5–15.5)
WBC: 9 10*3/uL (ref 4.0–10.5)

## 2017-06-19 LAB — LACTIC ACID, PLASMA: Lactic Acid, Venous: 0.9 mmol/L (ref 0.5–1.9)

## 2017-06-19 MED ORDER — PROMETHAZINE HCL 25 MG PO TABS: 25.0000 mg | ORAL_TABLET | Freq: Four times a day (QID) | ORAL | 0 refills | Status: DC | PRN

## 2017-06-19 MED ORDER — FAMOTIDINE 20 MG PO TABS: 20.0000 mg | ORAL_TABLET | Freq: Two times a day (BID) | ORAL | 0 refills | Status: DC

## 2017-06-19 MED ORDER — PROMETHAZINE HCL 12.5 MG PO TABS
25.0000 mg | ORAL_TABLET | Freq: Once | ORAL | Status: AC
  Administered 2017-06-19: 25 mg via ORAL
  Filled 2017-06-19: qty 2

## 2017-06-19 MED ORDER — FAMOTIDINE 20 MG PO TABS
20.0000 mg | ORAL_TABLET | Freq: Once | ORAL | Status: AC
  Administered 2017-06-19: 20 mg via ORAL
  Filled 2017-06-19: qty 1

## 2017-06-19 MED ORDER — DEXAMETHASONE 4 MG PO TABS: 4.0000 mg | ORAL_TABLET | Freq: Two times a day (BID) | ORAL | 0 refills | Status: DC

## 2017-06-19 MED ORDER — DIPHENHYDRAMINE HCL 25 MG PO CAPS
25.0000 mg | ORAL_CAPSULE | Freq: Once | ORAL | Status: AC
  Administered 2017-06-19: 25 mg via ORAL
  Filled 2017-06-19: qty 1

## 2017-06-19 NOTE — Discharge Instructions (Signed)
Your vital signs are within normal limits. Please use 20mg  of pepcid daily. Use decadron 2 times daily with food. Use promethazine three times daily and benadryl with each meal and at bed time. See Dr Luan Pulling in the office for follow up this week. See Dr Nevada Crane for dermatology consult if not improving.

## 2017-06-19 NOTE — ED Provider Notes (Signed)
[facility] DEPT Provider Note   CSN: [id number] Arrival date & time: 06/19/17  1904     History   Chief Complaint Chief Complaint  Patient presents with  . Allergic Reaction    HPI YOSMAR RYKER is a 61 y.o. male.  Patient is a 61 year old male who presents to the emergency department because of a rash on the arms, abdomen and back.  The patient states he has been dealing with this rash for several months on. He has been told that it was scabies. On August 2 was evaluated for this rash, but at that time it was accompanied by fever he was admitted to the hospital. The patient is been treated with Benadryl and Vistaril and the patient states that these have changed the itching very minimally, and had little effect on the rash itself. The patient has been seen by his primary care physician, but has not been given a diagnosis other than the scabies. The patient was treated with medication for the scabies and he feels that this may have made the rash worse instead of better. The patient denies shortness of breath. He denies swelling of the face, mouth, or tongue. He has not had new fevers to be reported. There's been no recent history of any tick bites. The patient is not been out of the country recently. He's not had any recent changes in his diet, and the only changes in his medication are those listed above. He states that the itching is now becoming unbearable and he would like to get some relief.      Past Medical History:  Diagnosis Date  . Anxiety   . BPH (benign prostatic hyperplasia)   . Chest pain   . Chronic pain    neck  . Constipation   . Genital herpes   . 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 Tobacco abuse     Patient Active Problem List   Diagnosis Date Noted  . 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 Scabies 06/09/2017  . Emphysema of lung ([place]) 06/09/2017  . Fever 06/08/2017  . Leukocytosis 06/08/2017  . BPH  (benign prostatic hyperplasia) 06/08/2017  . Chronic pain 06/08/2017  . Chronic constipation 05/11/2012  . Chest pain, atypical 12/14/2011  . Tobacco abuse 12/14/2011    Past Surgical History:  Procedure Laterality Date  . CHOLECYSTECTOMY  1982  . chordoma  1996  . COLONOSCOPY  2001   pt reports normal at [facility]  . COLONOSCOPY  05/30/2012   Procedure: COLONOSCOPY;  Surgeon: Daneil Dolin, MD;  Location: [facility];  Service: Endoscopy;  Laterality: N/A;  10:45  . MASTOIDECTOMY  1991  . NECK SURGERY     fracture C3-7       Home Medications    Prior to Admission medications   Medication Sig Start Date End Date Taking? Authorizing Provider  acetaminophen (TYLENOL) 500 MG tablet Take 500 mg by mouth every 6 (six) hours as needed for mild pain or moderate pain.   Yes [provider]  ALPRAZolam Duanne Moron) 1 MG tablet Take 1 mg by mouth 4 (four) times daily as needed. For nerves    Yes [provider]  aspirin EC 81 MG tablet Take 81 mg by mouth every other day.    Yes [provider]  diphenhydrAMINE (BENADRYL) 25 MG tablet Take 25 mg by mouth every 6 (six) hours as needed for itching.   Yes [provider]  diphenhydramine-acetaminophen (TYLENOL PM) 25-500 MG TABS tablet Take 1 tablet by mouth at bedtime as needed (sleep).   Yes [provider]  HYDROcodone-acetaminophen (NORCO) 10-325 MG per tablet Take 1 tablet by mouth 4 (four) times daily.   Yes [provider]  hydrOXYzine (ATARAX/VISTARIL) 25 MG tablet Take 1 tablet (25 mg total) by mouth every 8 (eight) hours as needed for itching. 01/08/17  Yes Malvin Johns, MD  meloxicam (MOBIC) 7.5 MG tablet Take 1 tablet by mouth 2 (two) times daily. 05/06/17  Yes [provider]  polyethylene glycol powder (GLYCOLAX/MIRALAX) powder Take 17 g by mouth daily as needed for moderate constipation.    Yes [provider]  tamsulosin (FLOMAX) 0.4 MG CAPS capsule Take 0.4 mg by  mouth daily.   Yes [provider]  traZODone (DESYREL) 50 MG tablet Take 50 mg by mouth at bedtime.   Yes [provider]  ivermectin (STROMECTOL) 3 MG TABS tablet Take 4 and 1/2 tablets on 8/10 Patient not taking: Reported on 06/19/2017 06/09/17   Annita Brod, MD  predniSONE (DELTASONE) 10 MG tablet Take 1 tablet (10 mg total) by mouth daily. Take 6 tablets for 3 days. After that take 1 tablet a day. Patient not taking: Reported on 06/19/2017 05/20/17   Volanda Napoleon, PA-C    Family History Family History  Problem Relation Age of Onset  . Hypertension Mother   . Heart disease Father   . Lung cancer Father   . Heart disease Sister   . Lung cancer Brother     Social History Social History  Substance Use Topics  . Smoking status: Current Every Day Smoker    Packs/day: 2.00    Years: 44.00    Types: Cigarettes  . Smokeless tobacco: Never Used  . Alcohol use No     Comment: quit about 4 years ago     Allergies   Celebrex [celecoxib]; Ciprofloxacin; Contrast media [iodinated diagnostic agents]; Dilaudid [hydromorphone]; Latex; Morphine and related; and Sulfa antibiotics   Review of Systems Review of Systems  Constitutional: Negative for activity change.       All ROS Neg except as noted in HPI  HENT: Negative for nosebleeds.   Eyes: Negative for photophobia and discharge.  Respiratory: Negative for cough, shortness of breath and wheezing.   Cardiovascular: Negative for chest pain and palpitations.  Gastrointestinal: Negative for abdominal pain and blood in stool.  Genitourinary: Negative for dysuria, frequency and hematuria.  Musculoskeletal: Negative for arthralgias, back pain and neck pain.  Skin: Positive for rash.  Neurological: Negative for dizziness, seizures and speech difficulty.  Psychiatric/Behavioral: Negative for confusion and hallucinations. The patient is nervous/anxious.      Physical Exam Updated Vital Signs BP 129/83 (BP  Location: Right Arm)   Pulse 78   Temp 98.1 F (36.7 C) (Oral)   Resp 15   Ht 5\' 10"  (1.778 m)   Wt 68 kg (150 lb)   SpO2 100%   BMI 21.52 kg/m   Physical Exam  Constitutional: He is oriented to person, place, and time. He appears well-developed and well-nourished.  Non-toxic appearance.  HENT:  Head: Normocephalic.  Right Ear: Tympanic membrane and external ear normal.  Left Ear: Tympanic membrane and external ear normal.  Eyes: Pupils are equal, round, and reactive to light. EOM and lids are normal.  Neck: Normal range of motion. Neck supple. Carotid bruit is not present.  Cardiovascular: Normal rate, regular rhythm, normal heart sounds, intact distal pulses  and normal pulses.   Pulmonary/Chest: Breath sounds normal. No respiratory distress.  Abdominal: Soft. Bowel sounds are normal. There is no tenderness. There is no guarding.  Musculoskeletal: Normal range of motion.  Lipoma on the left upper arm.  Lymphadenopathy:       Head (right side): No submandibular adenopathy present.       Head (left side): No submandibular adenopathy present.    He has no cervical adenopathy.  Neurological: He is alert and oriented to person, place, and time. He has normal strength. No cranial nerve deficit or sensory deficit.  Skin: Skin is warm and dry.  Increase redness of the right and left arm. Resolving red papular rash of the abd and back. Increase scratches and rash of the lower extremities.  Psychiatric: He has a normal mood and affect. His speech is normal.  Nursing note and vitals reviewed.    ED Treatments / Results  Labs (all labs ordered are listed, but only abnormal results are displayed) Labs Reviewed  CBC WITH DIFFERENTIAL/PLATELET - Abnormal; Notable for the following:       Result Value   RBC 4.13 (*)    All other components within normal limits  BASIC METABOLIC PANEL - Abnormal; Notable for the following:    Chloride 100 (*)    Calcium 8.8 (*)    All other components  within normal limits  LACTIC ACID, PLASMA  LACTIC ACID, PLASMA    EKG  EKG Interpretation None       Radiology No results found.  Procedures Procedures (including critical care time)  Medications Ordered in ED Medications  famotidine (PEPCID) tablet 20 mg (20 mg Oral Given 06/19/17 2210)  diphenhydrAMINE (BENADRYL) capsule 25 mg (25 mg Oral Given 06/19/17 2211)  promethazine (PHENERGAN) tablet 25 mg (25 mg Oral Given 06/19/17 2210)     Initial Impression / Assessment and Plan / ED Course  I have reviewed the triage vital signs and the nursing notes.  Pertinent labs & imaging results that were available during my care of the patient were reviewed by me and considered in my medical decision making (see chart for details).       Final Clinical Impressions(s) / ED Diagnoses MDM Patient has been dealing with a rash for a few months now. He returns today because the rash seems to be getting worse and itching is worse. He has tried Benadryl, but this has not been effective. The patient was treated in the emergency department with Pepcid, Benadryl, and Phenergan. He has some relief of the itching at this point. The airway is patent on. There no hives appreciated. There's been no recent fever reported. No neurologic changes reported.  Patient seen with me by Dr. Rogene Houston  Patient will be placed on steroids, he will continue Benadryl, and will add promethazine. The patient is strongly encouraged to see dermatology for formal evaluation of this rash and management. Patient is in agreement with this plan.    Final diagnoses:  None    New Prescriptions New Prescriptions   No medications on file     Lily Kocher, Hershal Coria 06/21/17 [place], [place], MD 06/23/17 [id number]

## 2017-06-19 NOTE — ED Provider Notes (Signed)
Medical screening examination/treatment/procedure(s) were conducted as a shared visit with non-physician practitioner(s) and myself.  I personally evaluated the patient during the encounter.   EKG Interpretation None       Results for orders placed or performed during the hospital encounter of 06/19/17  CBC with Differential  Result Value Ref Range   WBC 9.0 4.0 - 10.5 K/uL   RBC 4.13 (L) 4.22 - 5.81 MIL/uL   Hemoglobin 13.5 13.0 - 17.0 g/dL   HCT 40.5 39.0 - 52.0 %   MCV 98.1 78.0 - 100.0 fL   MCH 32.7 26.0 - 34.0 pg   MCHC 33.3 30.0 - 36.0 g/dL   RDW 13.4 11.5 - 15.5 %   Platelets 212 150 - 400 K/uL   Neutrophils Relative % 59 %   Neutro Abs 5.2 1.7 - 7.7 K/uL   Lymphocytes Relative 28 %   Lymphs Abs 2.6 0.7 - 4.0 K/uL   Monocytes Relative 10 %   Monocytes Absolute 0.9 0.1 - 1.0 K/uL   Eosinophils Relative 3 %   Eosinophils Absolute 0.3 0.0 - 0.7 K/uL   Basophils Relative 0 %   Basophils Absolute 0.0 0.0 - 0.1 K/uL  Basic metabolic panel  Result Value Ref Range   Sodium 135 135 - 145 mmol/L   Potassium 3.8 3.5 - 5.1 mmol/L   Chloride 100 (L) 101 - 111 mmol/L   CO2 27 22 - 32 mmol/L   Glucose, Bld 97 65 - 99 mg/dL   BUN 7 6 - 20 mg/dL   Creatinine, Ser 0.82 0.61 - 1.24 mg/dL   Calcium 8.8 (L) 8.9 - 10.3 mg/dL   GFR calc non Af Amer >60 >60 mL/min   GFR calc Af Amer >60 >60 mL/min   Anion gap 8 5 - 15  Lactic acid, plasma  Result Value Ref Range   Lactic Acid, Venous 0.9 0.5 - 1.9 mmol/L    Patient seen by me along with the physician assistant. Patient has had a rash on the lower part of his abdomen and legs since January. Patient with recent hospitalization. Patient treated as if this could be scabies without any resolution of it. Unlikely would have that since January. Patient's primary care doctors Dr. Luan Pulling. Patient nontoxic no acute distress. Increased rash with extensive erythema to both upper extremities. No hives. No wheezing swelling no lip swelling. While  not exactly sure what is causing redness to the arms. Patient's labs are very reassuring vital signs are very reassuring. Will going give a five-day course of prednisone and have him follow-up with primary care doctor.   Fredia Sorrow, MD 06/19/17 [phone]

## 2017-06-19 NOTE — ED Notes (Signed)
Both arms red and slightly swollen, states he is having a reaction to medication for scabies. Body is covered in a rash

## 2017-06-19 NOTE — ED Triage Notes (Signed)
Reports of allergic reaction after using medication for scabies. Redness noted to left arm.

## 2017-07-01 ENCOUNTER — Emergency Department (HOSPITAL_COMMUNITY)
Admission: EM | Admit: 2017-07-01 | Discharge: 2017-07-01 | Disposition: A | Discharge status: Home / Self Care | Payer: Medicare Other | Attending: Emergency Medicine | Admitting: Emergency Medicine

## 2017-07-01 ENCOUNTER — Encounter (HOSPITAL_COMMUNITY): Payer: Self-pay | Admitting: Emergency Medicine

## 2017-07-01 DIAGNOSIS — F17210 Nicotine dependence, cigarettes, uncomplicated: Secondary | ICD-10-CM | POA: Diagnosis not present

## 2017-07-01 DIAGNOSIS — L03114 Cellulitis of left upper limb: Secondary | ICD-10-CM | POA: Diagnosis not present

## 2017-07-01 DIAGNOSIS — Z91040 Latex allergy status: Secondary | ICD-10-CM | POA: Insufficient documentation

## 2017-07-01 DIAGNOSIS — R21 Rash and other nonspecific skin eruption: Secondary | ICD-10-CM | POA: Diagnosis not present

## 2017-07-01 DIAGNOSIS — M79632 Pain in left forearm: Secondary | ICD-10-CM | POA: Diagnosis present

## 2017-07-01 DIAGNOSIS — Z7982 Long term (current) use of aspirin: Secondary | ICD-10-CM | POA: Diagnosis not present

## 2017-07-01 LAB — I-STAT CG4 LACTIC ACID, ED: Lactic Acid, Venous: 1.33 mmol/L (ref 0.5–1.9)

## 2017-07-01 LAB — COMPREHENSIVE METABOLIC PANEL
ALBUMIN: 4 g/dL (ref 3.5–5.0)
ALK PHOS: 83 U/L (ref 38–126)
ALT: 25 U/L (ref 17–63)
AST: 18 U/L (ref 15–41)
Anion gap: 8 (ref 5–15)
BILIRUBIN TOTAL: 0.4 mg/dL (ref 0.3–1.2)
BUN: 10 mg/dL (ref 6–20)
CO2: 28 mmol/L (ref 22–32)
Calcium: 9.3 mg/dL (ref 8.9–10.3)
Chloride: 99 mmol/L — ABNORMAL LOW (ref 101–111)
Creatinine, Ser: 0.79 mg/dL (ref 0.61–1.24)
GFR calc Af Amer: >60 mL/min (ref >60)
GFR calc non Af Amer: >60 mL/min (ref >60)
GLUCOSE: 88 mg/dL (ref 65–99)
POTASSIUM: 3.9 mmol/L (ref 3.5–5.1)
Sodium: 135 mmol/L (ref 135–145)
TOTAL PROTEIN: 7.8 g/dL (ref 6.5–8.1)

## 2017-07-01 LAB — CBC WITH DIFFERENTIAL/PLATELET
BASOS ABS: 0 10*3/uL (ref 0.0–0.1)
BASOS PCT: 0 %
Eosinophils Absolute: 0.5 10*3/uL (ref 0.0–0.7)
Eosinophils Relative: 4 %
HEMATOCRIT: 46.6 % (ref 39.0–52.0)
HEMOGLOBIN: 15.3 g/dL (ref 13.0–17.0)
Lymphocytes Relative: 21 %
Lymphs Abs: 3 10*3/uL (ref 0.7–4.0)
MCH: 32.3 pg (ref 26.0–34.0)
MCHC: 32.8 g/dL (ref 30.0–36.0)
MCV: 98.5 fL (ref 78.0–100.0)
Monocytes Absolute: 1.6 10*3/uL — ABNORMAL HIGH (ref 0.1–1.0)
Monocytes Relative: 11 %
NEUTROS ABS: 9 10*3/uL — AB (ref 1.7–7.7)
NEUTROS PCT: 64 %
Platelets: 261 10*3/uL (ref 150–400)
RBC: 4.73 MIL/uL (ref 4.22–5.81)
RDW: 13.5 % (ref 11.5–15.5)
WBC: 14.1 10*3/uL — AB (ref 4.0–10.5)

## 2017-07-01 MED ORDER — CLINDAMYCIN HCL 150 MG PO CAPS
300.0000 mg | ORAL_CAPSULE | Freq: Once | ORAL | Status: AC
  Administered 2017-07-01: 300 mg via ORAL
  Filled 2017-07-01: qty 2

## 2017-07-01 MED ORDER — DIPHENHYDRAMINE HCL 25 MG PO CAPS
25.0000 mg | ORAL_CAPSULE | Freq: Once | ORAL | Status: AC
  Administered 2017-07-01: 25 mg via ORAL
  Filled 2017-07-01: qty 1

## 2017-07-01 MED ORDER — HYDROXYZINE HCL 25 MG PO TABS: 25.0000 mg | ORAL_TABLET | Freq: Three times a day (TID) | ORAL | 0 refills | Status: DC | PRN

## 2017-07-01 MED ORDER — CLINDAMYCIN HCL 300 MG PO CAPS: 300.0000 mg | ORAL_CAPSULE | Freq: Four times a day (QID) | ORAL | 0 refills | Status: DC

## 2017-07-01 NOTE — ED Triage Notes (Signed)
Pt reports chronic arm infections after series of skin tears.  Redness and swelling to bilateral arms.

## 2017-07-01 NOTE — ED Provider Notes (Signed)
[facility] DEPT Provider Note   CSN: [id number] Arrival date & time: 07/01/17  1115     History   Chief Complaint Chief Complaint  Patient presents with  . Wound Infection    HPI Jaime [name] is a 61 y.o. male.  HPI Patient with chronic diffuse rash who was recently admitted to the hospital and treated for scabies. His significant other was also treated. The rash appeared to be improving in certain areas. Patient states that he had blood drawn several days ago and had slight skin tear to the left forearm. Since that time he's had increased redness and pain around the site. Denies any recent fever or chills. No shortness of breath.  Past Medical History:  Diagnosis Date  . Anxiety   . BPH (benign prostatic hyperplasia)   . Chest pain   . Chronic pain    neck  . Constipation   . Genital herpes   . 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 Tobacco abuse     Patient Active Problem List   Diagnosis Date Noted  . 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 Scabies 06/09/2017  . Emphysema of lung ([place]) 06/09/2017  . Fever 06/08/2017  . Leukocytosis 06/08/2017  . BPH (benign prostatic hyperplasia) 06/08/2017  . Chronic pain 06/08/2017  . Chronic constipation 05/11/2012  . Chest pain, atypical 12/14/2011  . Tobacco abuse 12/14/2011    Past Surgical History:  Procedure Laterality Date  . CHOLECYSTECTOMY  1982  . chordoma  1996  . COLONOSCOPY  2001   pt reports normal at [facility]  . COLONOSCOPY  05/30/2012   Procedure: COLONOSCOPY;  Surgeon: Daneil Dolin, MD;  Location: [facility];  Service: Endoscopy;  Laterality: N/A;  10:45  . MASTOIDECTOMY  1991  . NECK SURGERY     fracture C3-7       Home Medications    Prior to Admission medications   Medication Sig Start Date End Date Taking? Authorizing Provider  acetaminophen (TYLENOL) 500 MG tablet Take 500 mg by mouth every 6 (six) hours as needed for mild pain or moderate  pain.   Yes [provider]  ALPRAZolam Duanne Moron) 1 MG tablet Take 1 mg by mouth 4 (four) times daily as needed. For nerves    Yes [provider]  aspirin EC 81 MG tablet Take 81 mg by mouth every other day.    Yes [provider]  diphenhydramine-acetaminophen (TYLENOL PM) 25-500 MG TABS tablet Take 1 tablet by mouth at bedtime as needed (sleep).   Yes [provider]  HYDROcodone-acetaminophen (NORCO) 10-325 MG per tablet Take 1 tablet by mouth 4 (four) times daily.   Yes [provider]  meloxicam (MOBIC) 7.5 MG tablet Take 1 tablet by mouth 2 (two) times daily. 05/06/17  Yes [provider]  polyethylene glycol powder (GLYCOLAX/MIRALAX) powder Take 17 g by mouth daily as needed for moderate constipation.    Yes [provider]  promethazine (PHENERGAN) 25 MG tablet Take 1 tablet (25 mg total) by mouth every 6 (six) hours as needed for nausea or vomiting. 06/19/17  Yes Lily Kocher, PA-C  tamsulosin (FLOMAX) 0.4 MG CAPS capsule Take 0.4 mg by mouth daily.   Yes [provider]  traZODone (DESYREL) 50 MG tablet Take 50 mg by mouth at bedtime as needed for sleep.    Yes [provider]  clindamycin (CLEOCIN) 300 MG capsule Take 1 capsule (300 mg total)  by mouth 4 (four) times daily. X 7 days 07/01/17   Julianne Rice, MD  dexamethasone (DECADRON) 4 MG tablet Take 1 tablet (4 mg total) by mouth 2 (two) times daily with a meal. Patient not taking: Reported on 07/01/2017 06/19/17   Lily Kocher, PA-C  famotidine (PEPCID) 20 MG tablet Take 1 tablet (20 mg total) by mouth 2 (two) times daily. Patient not taking: Reported on 07/01/2017 06/19/17   Lily Kocher, PA-C  hydrOXYzine (ATARAX/VISTARIL) 25 MG tablet Take 1 tablet (25 mg total) by mouth every 8 (eight) hours as needed for itching. 07/01/17   Julianne Rice, MD  ivermectin (STROMECTOL) 3 MG TABS tablet Take 4 and 1/2 tablets on 8/10 Patient not taking: Reported on  06/19/2017 06/09/17   Annita Brod, MD  predniSONE (DELTASONE) 10 MG tablet Take 1 tablet (10 mg total) by mouth daily. Take 6 tablets for 3 days. After that take 1 tablet a day. Patient not taking: Reported on 06/19/2017 05/20/17   Volanda Napoleon, PA-C    Family History Family History  Problem Relation Age of Onset  . Hypertension Mother   . Heart disease Father   . Lung cancer Father   . Heart disease Sister   . Lung cancer Brother     Social History Social History  Substance Use Topics  . Smoking status: Current Every Day Smoker    Packs/day: 2.00    Years: 44.00    Types: Cigarettes  . Smokeless tobacco: Never Used  . Alcohol use No     Comment: quit about 4 years ago     Allergies   Celebrex [celecoxib]; Ciprofloxacin; Contrast media [iodinated diagnostic agents]; Dilaudid [hydromorphone]; Latex; Morphine and related; and Sulfa antibiotics   Review of Systems Review of Systems  Constitutional: Negative for chills, fatigue and fever.  HENT: Negative for facial swelling, trouble swallowing and voice change.   Respiratory: Negative for cough and shortness of breath.   Cardiovascular: Negative for chest pain.  Gastrointestinal: Negative for abdominal pain, diarrhea, nausea and vomiting.  Musculoskeletal: Negative for back pain, neck pain and neck stiffness.  Skin: Positive for color change and rash.  Neurological: Negative for dizziness, weakness, light-headedness, numbness and headaches.  All other systems reviewed and are negative.    Physical Exam Updated Vital Signs BP (!) 144/87 (BP Location: Right Arm)   Pulse 92   Temp 98.3 F (36.8 C) (Oral)   Resp 18   Ht 5\' 10"  (1.778 m)   Wt 68 kg (150 lb)   SpO2 99%   BMI 21.52 kg/m   Physical Exam  Constitutional: He is oriented to person, place, and time. He appears well-developed and well-nourished.  HENT:  Head: Normocephalic and atraumatic.  Mouth/Throat: Oropharynx is clear and moist. No  oropharyngeal exudate.  Eyes: Pupils are equal, round, and reactive to light. EOM are normal.  Neck: Normal range of motion. Neck supple.  Cardiovascular: Normal rate and regular rhythm.  Exam reveals no gallop and no friction rub.   No murmur heard. Pulmonary/Chest: Effort normal and breath sounds normal. No stridor. No respiratory distress. He has no wheezes. He has no rales. He exhibits no tenderness.  Abdominal: Soft. Bowel sounds are normal. There is no tenderness. There is no rebound and no guarding.  Musculoskeletal: Normal range of motion. He exhibits no edema or tenderness.  Neurological: He is alert and oriented to person, place, and time.  Skin: Skin is warm and dry. Rash noted. There is erythema.  Patient  with diffuse maculopapular rash crusting and excoriation. He does have erythema and firmness to the proximal left forearm just distal to the antecubital fossa. Appears to emanate from a small lesion where he says the "skin was ripped". Mild warmth but no evidence of abscess.  Psychiatric: He has a normal mood and affect. His behavior is normal.  Nursing note and vitals reviewed.    ED Treatments / Results  Labs (all labs ordered are listed, but only abnormal results are displayed) Labs Reviewed  CBC WITH DIFFERENTIAL/PLATELET - Abnormal; Notable for the following:       Result Value   WBC 14.1 (*)    Neutro Abs 9.0 (*)    Monocytes Absolute 1.6 (*)    All other components within normal limits  COMPREHENSIVE METABOLIC PANEL - Abnormal; Notable for the following:    Chloride 99 (*)    All other components within normal limits  CULTURE, BLOOD (ROUTINE X 2)  CULTURE, BLOOD (ROUTINE X 2)  I-STAT CG4 LACTIC ACID, ED    EKG  EKG Interpretation None       Radiology No results found.  Procedures Procedures (including critical care time)  Medications Ordered in ED Medications  diphenhydrAMINE (BENADRYL) capsule 25 mg (25 mg Oral Given 07/01/17 1343)  clindamycin  (CLEOCIN) capsule 300 mg (300 mg Oral Given 07/01/17 1551)     Initial Impression / Assessment and Plan / ED Course  I have reviewed the triage vital signs and the nursing notes.  Pertinent labs & imaging results that were available during my care of the patient were reviewed by me and considered in my medical decision making (see chart for details).     Localized reaction versus secondary infection to the left forearm.  Patient is well-appearing. Given dose of IV antibiotics in the emergency department and will discharge. Will need close follow-up with his primary physician. Return precautions given. Final Clinical Impressions(s) / ED Diagnoses   Final diagnoses:  Cellulitis of left forearm    New Prescriptions Discharge Medication List as of 07/01/2017  3:51 PM    START taking these medications   Details  clindamycin (CLEOCIN) 300 MG capsule Take 1 capsule (300 mg total) by mouth 4 (four) times daily. X 7 days, Starting Sat 07/01/2017, Print         Julianne Rice, MD 07/02/17 [phone]

## 2017-07-01 NOTE — ED Notes (Addendum)
Pt reports recent scabies infection.

## 2017-07-06 LAB — CULTURE, BLOOD (ROUTINE X 2)
Culture: NO GROWTH
Culture: NO GROWTH

## 2017-08-03 ENCOUNTER — Ambulatory Visit: Payer: Medicare Other | Admitting: Nurse Practitioner

## 2017-08-07 ENCOUNTER — Emergency Department (HOSPITAL_COMMUNITY)
Admission: EM | Admit: 2017-08-07 | Discharge: 2017-08-07 | Disposition: A | Discharge status: Home / Self Care | Payer: Medicare Other | Attending: Emergency Medicine | Admitting: Emergency Medicine

## 2017-08-07 ENCOUNTER — Encounter (HOSPITAL_COMMUNITY): Payer: Self-pay | Admitting: Emergency Medicine

## 2017-08-07 DIAGNOSIS — R6889 Other general symptoms and signs: Secondary | ICD-10-CM | POA: Insufficient documentation

## 2017-08-07 DIAGNOSIS — Z79899 Other long term (current) drug therapy: Secondary | ICD-10-CM | POA: Insufficient documentation

## 2017-08-07 DIAGNOSIS — Z7982 Long term (current) use of aspirin: Secondary | ICD-10-CM | POA: Insufficient documentation

## 2017-08-07 DIAGNOSIS — F17210 Nicotine dependence, cigarettes, uncomplicated: Secondary | ICD-10-CM | POA: Diagnosis not present

## 2017-08-07 DIAGNOSIS — Z91040 Latex allergy status: Secondary | ICD-10-CM | POA: Insufficient documentation

## 2017-08-07 HISTORY — DX: Polyneuropathy, unspecified: G62.9

## 2017-08-07 LAB — CBC WITH DIFFERENTIAL/PLATELET
Basophils Absolute: 0 10*3/uL (ref 0.0–0.1)
Basophils Relative: 0 %
EOS ABS: 0.1 10*3/uL (ref 0.0–0.7)
Eosinophils Relative: 1 %
HCT: 44.3 % (ref 39.0–52.0)
Hemoglobin: 15 g/dL (ref 13.0–17.0)
LYMPHS ABS: 2.4 10*3/uL (ref 0.7–4.0)
LYMPHS PCT: 27 %
MCH: 32.3 pg (ref 26.0–34.0)
MCHC: 33.9 g/dL (ref 30.0–36.0)
MCV: 95.3 fL (ref 78.0–100.0)
MONOS PCT: 8 %
Monocytes Absolute: 0.7 10*3/uL (ref 0.1–1.0)
NEUTROS ABS: 5.9 10*3/uL (ref 1.7–7.7)
Neutrophils Relative %: 64 %
Platelets: 199 10*3/uL (ref 150–400)
RBC: 4.65 MIL/uL (ref 4.22–5.81)
RDW: 12.8 % (ref 11.5–15.5)
WBC: 9.2 10*3/uL (ref 4.0–10.5)

## 2017-08-07 LAB — BASIC METABOLIC PANEL
Anion gap: 12 (ref 5–15)
BUN: 6 mg/dL (ref 6–20)
CHLORIDE: 95 mmol/L — AB (ref 101–111)
CO2: 27 mmol/L (ref 22–32)
CREATININE: 0.54 mg/dL — AB (ref 0.61–1.24)
Calcium: 9.9 mg/dL (ref 8.9–10.3)
GFR calc Af Amer: >60 mL/min (ref >60)
GFR calc non Af Amer: >60 mL/min (ref >60)
GLUCOSE: 105 mg/dL — AB (ref 65–99)
POTASSIUM: 3.7 mmol/L (ref 3.5–5.1)
SODIUM: 134 mmol/L — AB (ref 135–145)

## 2017-08-07 MED ORDER — ALPRAZOLAM 0.5 MG PO TABS
1.0000 mg | ORAL_TABLET | Freq: Once | ORAL | Status: AC
  Administered 2017-08-07: 1 mg via ORAL
  Filled 2017-08-07: qty 2

## 2017-08-07 MED ORDER — HYDROXYZINE HCL 25 MG PO TABS: 25.0000 mg | ORAL_TABLET | Freq: Four times a day (QID) | ORAL | 0 refills | Status: DC
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
## 2017-09-06 DIAGNOSIS — Z79891 Long term (current) use of opiate analgesic: Secondary | ICD-10-CM | POA: Diagnosis not present

## 2017-09-18 DIAGNOSIS — M545 Low back pain: Secondary | ICD-10-CM | POA: Diagnosis not present

## 2017-09-18 DIAGNOSIS — G8250 Quadriplegia, unspecified: Secondary | ICD-10-CM | POA: Diagnosis not present

## 2017-09-18 DIAGNOSIS — J449 Chronic obstructive pulmonary disease, unspecified: Secondary | ICD-10-CM | POA: Diagnosis not present

## 2017-09-19 ENCOUNTER — Ambulatory Visit: Payer: Medicare Other | Admitting: Nurse Practitioner

## 2017-09-19 NOTE — Progress Notes (Deleted)
Primary Care Physician:  Sinda Du, MD Primary Gastroenterologist:  Dr. Gala Romney  No chief complaint on file.   HPI:   Jaime [name] is a 61 y.o. male who presents because he is due for recall colonoscopy and to consider need for augmented sedation.  His previous colonoscopy was completed 05/30/2012 unconscious sedation.  He has a history of chronic constipation which improved at that time on MiraLAX.  Findings include multiple diminutive rectal polyps, a digital rectal polyp, sigmoid polyp, adjacent diminutive polyps in the sigmoid colon, diminutive descending colon polyps status post biopsy.  Surgical pathology was reviewed and descending colon polyp found to be benign colorectal mucosa, rectal polyp hyperplastic, sigmoid polyp tubular adenoma.  Recommended repeat colonoscopy in 5 years.  He is currently due.  He was brought into the office to consider augmented sedation due to multiple recent ED visits some of which for opioid withdrawal.  Today he states   Past Medical History:  Diagnosis Date  . Anxiety   . BPH (benign prostatic hyperplasia)   . Chest pain   . Chronic pain    neck  . Constipation   . Genital herpes   . Neuropathy   . 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 Tobacco abuse     Past Surgical History:  Procedure Laterality Date  . CHOLECYSTECTOMY  1982  . chordoma  1996  . COLONOSCOPY  2001   pt reports normal at [facility]  . MASTOIDECTOMY  1991  . NECK SURGERY     fracture C3-7    Current Outpatient Medications  Medication Sig Dispense Refill  . acetaminophen (TYLENOL) 500 MG tablet Take 500 mg by mouth every 6 (six) hours as needed for mild pain or moderate pain.    Marland Kitchen ALPRAZolam (XANAX) 1 MG tablet Take 1 mg by mouth 4 (four) times daily as needed. For nerves     . aspirin EC 81 MG tablet Take 81 mg by mouth every other day.     . clindamycin (CLEOCIN) 300 MG capsule Take 1 capsule (300 mg total) by mouth 4 (four)  times daily. X 7 days 28 capsule 0  . dexamethasone (DECADRON) 4 MG tablet Take 1 tablet (4 mg total) by mouth 2 (two) times daily with a meal. (Patient not taking: Reported on 07/01/2017) 12 tablet 0  . diphenhydramine-acetaminophen (TYLENOL PM) 25-500 MG TABS tablet Take 1 tablet by mouth at bedtime as needed (sleep).    . famotidine (PEPCID) 20 MG tablet Take 1 tablet (20 mg total) by mouth 2 (two) times daily. (Patient not taking: Reported on 07/01/2017) 20 tablet 0  . HYDROcodone-acetaminophen (NORCO) 10-325 MG per tablet Take 1 tablet by mouth 4 (four) times daily.    . hydrOXYzine (ATARAX/VISTARIL) 25 MG tablet Take 1 tablet (25 mg total) by mouth every 6 (six) hours. 20 tablet 0  . ivermectin (STROMECTOL) 3 MG TABS tablet Take 4 and 1/2 tablets on 8/10 (Patient not taking: Reported on 06/19/2017) 5 tablet 0  . meloxicam (MOBIC) 7.5 MG tablet Take 1 tablet by mouth 2 (two) times daily.    . polyethylene glycol powder (GLYCOLAX/MIRALAX) powder Take 17 g by mouth daily as needed for moderate constipation.     . predniSONE (DELTASONE) 10 MG tablet Take 1 tablet (10 mg total) by mouth daily. Take 6 tablets for 3 days. After that take 1 tablet a day. (Patient not taking: Reported on 06/19/2017) 40 tablet 0  .  promethazine (PHENERGAN) 25 MG tablet Take 1 tablet (25 mg total) by mouth every 6 (six) hours as needed for nausea or vomiting. 24 tablet 0  . tamsulosin (FLOMAX) 0.4 MG CAPS capsule Take 0.4 mg by mouth daily.    . traZODone (DESYREL) 50 MG tablet Take 50 mg by mouth at bedtime as needed for sleep.      No current facility-administered medications for this visit.     Allergies as of 09/19/2017 - Review Complete 08/07/2017  Allergen Reaction Noted  . Celebrex [celecoxib] Itching 10/28/2011  . Ciprofloxacin Hives 12/14/2011  . Contrast media [iodinated diagnostic agents]  05/21/2012  . Dilaudid [hydromorphone] Other (See Comments) 05/30/2012  . Latex Hives 11/28/2011  . Morphine and  related  12/14/2011  . Sulfa antibiotics Other (See Comments) 10/28/2011    Family History  Problem Relation Age of Onset  . Hypertension Mother   . Heart disease Father   . Lung cancer Father   . Heart disease Sister   . Lung cancer Brother     Social History   Socioeconomic History  . Marital status: Divorced    Spouse name: Not on file  . Number of children: 4  . Years of education: Not on file  . Highest education level: Not on file  Social Needs  . Financial resource strain: Not on file  . Food insecurity - worry: Not on file  . Food insecurity - inability: Not on file  . Transportation needs - medical: Not on file  . Transportation needs - non-medical: Not on file  Occupational History  . Occupation: disabled  Tobacco Use  . Smoking status: Current Every Day Smoker    Packs/day: 2.00    Years: 44.00    Pack years: 88.00    Types: Cigarettes  . Smokeless tobacco: Never Used  Substance and Sexual Activity  . Alcohol use: No    Comment: quit about 4 years ago  . Drug use: No  . Sexual activity: Not on file  Other Topics Concern  . Not on file  Social History Narrative   Lives w/ significant other    Review of Systems: General: Negative for anorexia, weight loss, fever, chills, fatigue, weakness. Eyes: Negative for vision changes.  ENT: Negative for hoarseness, difficulty swallowing , nasal congestion. CV: Negative for chest pain, angina, palpitations, dyspnea on exertion, peripheral edema.  Respiratory: Negative for dyspnea at rest, dyspnea on exertion, cough, sputum, wheezing.  GI: See history of present illness. GU:  Negative for dysuria, hematuria, urinary incontinence, urinary frequency, nocturnal urination.  MS: Negative for joint pain, low back pain.  Derm: Negative for rash or itching.  Neuro: Negative for weakness, abnormal sensation, seizure, frequent headaches, memory loss, confusion.  Psych: Negative for anxiety, depression, suicidal ideation,  hallucinations.  Endo: Negative for unusual weight change.  Heme: Negative for bruising or bleeding. Allergy: Negative for rash or hives.    Physical Exam: There were no vitals taken for this visit. General:   Alert and oriented. Pleasant and cooperative. Well-nourished and well-developed.  Head:  Normocephalic and atraumatic. Eyes:  Without icterus, sclera clear and conjunctiva pink.  Ears:  Normal auditory acuity. Mouth:  No deformity or lesions, oral mucosa pink.  Throat/Neck:  Supple, without mass or thyromegaly. Cardiovascular:  S1, S2 present without murmurs appreciated. Normal pulses noted. Extremities without clubbing or edema. Respiratory:  Clear to auscultation bilaterally. No wheezes, rales, or rhonchi. No distress.  Gastrointestinal:  +BS, soft, non-tender and non-distended. No HSM noted.  No guarding or rebound. No masses appreciated.  Rectal:  Deferred  Musculoskalatal:  Symmetrical without gross deformities. Normal posture. Skin:  Intact without significant lesions or rashes. Neurologic:  Alert and oriented x4;  grossly normal neurologically. Psych:  Alert and cooperative. Normal mood and affect. Heme/Lymph/Immune: No significant cervical adenopathy. No excessive bruising noted.    09/19/2017 8:53 AM   Disclaimer: This note was dictated with voice recognition software. Similar sounding words can inadvertently be transcribed and may not be corrected upon review.

## 2017-11-15 ENCOUNTER — Ambulatory Visit: Payer: Medicare Other | Admitting: Nurse Practitioner

## 2017-11-15 ENCOUNTER — Telehealth: Payer: Self-pay | Admitting: Internal Medicine

## 2017-11-15 ENCOUNTER — Encounter: Payer: Self-pay | Admitting: Nurse Practitioner

## 2017-11-15 NOTE — Telephone Encounter (Signed)
PATIENT WAS A NO SHOW AND LETTER SENT  °

## 2017-11-15 NOTE — Progress Notes (Deleted)
Primary Care Physician:  Sinda Du, MD Primary Gastroenterologist:  Dr. Gala Romney  No chief complaint on file.   HPI:   Jaime [name] is a 62 y.o. male who presents "recall colonoscopy and EGD."  Last colonoscopy completed 05/30/2012 for average risk screening and recent constipation improved on MiraLAX.  Findings included a single 4 mm polyp inside the anal verge, a 7 mm pedunculated mid sigmoid polyp, diminutive polyps in the mid sigmoid and mid descending segment as well.  Otherwise normal.  Surgical pathology found the polyps to be a mix of hyperplastic, benign polypoid mucosa, tubular adenoma.  Recommended MiraLAX 1-2 doses daily as needed for constipation, recommended repeat colonoscopy in 5 years (2018).  I cannot find record of previous endoscopy in our system.  Today he states   Past Medical History:  Diagnosis Date  . Anxiety   . BPH (benign prostatic hyperplasia)   . Chest pain   . Chronic pain    neck  . Constipation   . Genital herpes   . Neuropathy   . 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 Tobacco abuse     Past Surgical History:  Procedure Laterality Date  . CHOLECYSTECTOMY  1982  . chordoma  1996  . COLONOSCOPY  2001   pt reports [facility] at [facility]  . COLONOSCOPY  05/30/2012   Procedure: COLONOSCOPY;  Surgeon: Daneil Dolin, MD;  Location: [facility];  Service: Endoscopy;  Laterality: N/A;  10:45  . MASTOIDECTOMY  1991  . NECK SURGERY     fracture C3-7    Current Outpatient Medications  Medication Sig Dispense Refill  . acetaminophen (TYLENOL) 500 MG tablet Take 500 mg by mouth every 6 (six) hours as needed for mild pain or moderate pain.    Marland Kitchen ALPRAZolam (XANAX) 1 MG tablet Take 1 mg by mouth 4 (four) times daily as needed. For nerves     . aspirin EC 81 MG tablet Take 81 mg by mouth every other day.     . clindamycin (CLEOCIN) 300 MG capsule Take 1 capsule (300 mg total) by mouth 4 (four) times daily. X 7  days 28 capsule 0  . dexamethasone (DECADRON) 4 MG tablet Take 1 tablet (4 mg total) by mouth 2 (two) times daily with a meal. (Patient not taking: Reported on 07/01/2017) 12 tablet 0  . diphenhydramine-acetaminophen (TYLENOL PM) 25-500 MG TABS tablet Take 1 tablet by mouth at bedtime as needed (sleep).    . famotidine (PEPCID) 20 MG tablet Take 1 tablet (20 mg total) by mouth 2 (two) times daily. (Patient not taking: Reported on 07/01/2017) 20 tablet 0  . HYDROcodone-acetaminophen (NORCO) 10-325 MG per tablet Take 1 tablet by mouth 4 (four) times daily.    . hydrOXYzine (ATARAX/VISTARIL) 25 MG tablet Take 1 tablet (25 mg total) by mouth every 6 (six) hours. 20 tablet 0  . ivermectin (STROMECTOL) 3 MG TABS tablet Take 4 and 1/2 tablets on 8/10 (Patient not taking: Reported on 06/19/2017) 5 tablet 0  . meloxicam (MOBIC) 7.5 MG tablet Take 1 tablet by mouth 2 (two) times daily.    . polyethylene glycol powder (GLYCOLAX/MIRALAX) powder Take 17 g by mouth daily as needed for moderate constipation.     . predniSONE (DELTASONE) 10 MG tablet Take 1 tablet (10 mg total) by mouth daily. Take 6 tablets for 3 days. After that take 1 tablet a day. (Patient not taking: Reported on 06/19/2017) 40  tablet 0  . promethazine (PHENERGAN) 25 MG tablet Take 1 tablet (25 mg total) by mouth every 6 (six) hours as needed for nausea or vomiting. 24 tablet 0  . tamsulosin (FLOMAX) 0.4 MG CAPS capsule Take 0.4 mg by mouth daily.    . traZODone (DESYREL) 50 MG tablet Take 50 mg by mouth at bedtime as needed for sleep.      No current facility-administered medications for this visit.     Allergies as of 11/15/2017 - Review Complete 08/07/2017  Allergen Reaction Noted  . Celebrex [celecoxib] Itching 10/28/2011  . Ciprofloxacin Hives 12/14/2011  . Contrast media [iodinated diagnostic agents]  05/21/2012  . Dilaudid [hydromorphone] Other (See Comments) 05/30/2012  . Latex Hives 11/28/2011  . Morphine and related  12/14/2011  .  Sulfa antibiotics Other (See Comments) 10/28/2011    Family History  Problem Relation Age of Onset  . Hypertension Mother   . Heart disease Father   . Lung cancer Father   . Heart disease Sister   . Lung cancer Brother     Social History   Socioeconomic History  . Marital status: Divorced    Spouse name: Not on file  . Number of children: 4  . Years of education: Not on file  . Highest education level: Not on file  Social Needs  . Financial resource strain: Not on file  . Food insecurity - worry: Not on file  . Food insecurity - inability: Not on file  . Transportation needs - medical: Not on file  . Transportation needs - non-medical: Not on file  Occupational History  . Occupation: disabled  Tobacco Use  . Smoking status: Current Every Day Smoker    Packs/day: 2.00    Years: 44.00    Pack years: 88.00    Types: Cigarettes  . Smokeless tobacco: Never Used  Substance and Sexual Activity  . Alcohol use: No    Comment: quit about 4 years ago  . Drug use: No  . Sexual activity: Not on file  Other Topics Concern  . Not on file  Social History Narrative   Lives w/ significant other    Review of Systems: General: Negative for anorexia, weight loss, fever, chills, fatigue, weakness. Eyes: Negative for vision changes.  ENT: Negative for hoarseness, difficulty swallowing , nasal congestion. CV: Negative for chest pain, angina, palpitations, dyspnea on exertion, peripheral edema.  Respiratory: Negative for dyspnea at rest, dyspnea on exertion, cough, sputum, wheezing.  GI: See history of present illness. GU:  Negative for dysuria, hematuria, urinary incontinence, urinary frequency, nocturnal urination.  MS: Negative for joint pain, low back pain.  Derm: Negative for rash or itching.  Neuro: Negative for weakness, abnormal sensation, seizure, frequent headaches, memory loss, confusion.  Psych: Negative for anxiety, depression, suicidal ideation, hallucinations.  Endo:  Negative for unusual weight change.  Heme: Negative for bruising or bleeding. Allergy: Negative for rash or hives.    Physical Exam: There were no vitals taken for this visit. General:   Alert and oriented. Pleasant and cooperative. Well-nourished and well-developed.  Head:  Normocephalic and atraumatic. Eyes:  Without icterus, sclera clear and conjunctiva pink.  Ears:  Normal auditory acuity. Mouth:  No deformity or lesions, oral mucosa pink.  Throat/Neck:  Supple, without mass or thyromegaly. Cardiovascular:  S1, S2 present without murmurs appreciated. Normal pulses noted. Extremities without clubbing or edema. Respiratory:  Clear to auscultation bilaterally. No wheezes, rales, or rhonchi. No distress.  Gastrointestinal:  +BS, soft, non-tender and  non-distended. No HSM noted. No guarding or rebound. No masses appreciated.  Rectal:  Deferred  Musculoskalatal:  Symmetrical without gross deformities. Normal posture. Skin:  Intact without significant lesions or rashes. Neurologic:  Alert and oriented x4;  grossly normal neurologically. Psych:  Alert and cooperative. Normal mood and affect. Heme/Lymph/Immune: No significant cervical adenopathy. No excessive bruising noted.    11/15/2017 2:43 PM   Disclaimer: This note was dictated with voice recognition software. Similar sounding words can inadvertently be transcribed and may not be corrected upon review.

## 2017-11-15 NOTE — Telephone Encounter (Signed)
Noted  

## 2017-12-20 DIAGNOSIS — M545 Low back pain: Secondary | ICD-10-CM | POA: Diagnosis not present

## 2017-12-20 DIAGNOSIS — G8250 Quadriplegia, unspecified: Secondary | ICD-10-CM | POA: Diagnosis not present

## 2017-12-20 DIAGNOSIS — J449 Chronic obstructive pulmonary disease, unspecified: Secondary | ICD-10-CM | POA: Diagnosis not present

## 2018-02-07 ENCOUNTER — Encounter: Payer: Self-pay | Admitting: Internal Medicine

## 2018-02-13 ENCOUNTER — Ambulatory Visit: Payer: Medicare Other

## 2018-02-22 DIAGNOSIS — Z79891 Long term (current) use of opiate analgesic: Secondary | ICD-10-CM | POA: Diagnosis not present

## 2018-03-20 ENCOUNTER — Other Ambulatory Visit (HOSPITAL_COMMUNITY): Payer: Self-pay | Admitting: Pulmonary Disease

## 2018-03-20 DIAGNOSIS — Z0000 Encounter for general adult medical examination without abnormal findings: Secondary | ICD-10-CM | POA: Diagnosis not present

## 2018-03-20 DIAGNOSIS — N471 Phimosis: Secondary | ICD-10-CM | POA: Diagnosis not present

## 2018-03-20 DIAGNOSIS — R21 Rash and other nonspecific skin eruption: Secondary | ICD-10-CM | POA: Diagnosis not present

## 2018-03-20 DIAGNOSIS — M545 Low back pain: Secondary | ICD-10-CM | POA: Diagnosis not present

## 2018-03-20 DIAGNOSIS — G8250 Quadriplegia, unspecified: Secondary | ICD-10-CM | POA: Diagnosis not present

## 2018-03-20 DIAGNOSIS — M542 Cervicalgia: Secondary | ICD-10-CM

## 2018-03-20 DIAGNOSIS — S199XXD Unspecified injury of neck, subsequent encounter: Secondary | ICD-10-CM | POA: Diagnosis not present

## 2018-03-20 DIAGNOSIS — B880 Other acariasis: Secondary | ICD-10-CM | POA: Diagnosis not present

## 2018-03-20 DIAGNOSIS — L502 Urticaria due to cold and heat: Secondary | ICD-10-CM | POA: Diagnosis not present

## 2018-03-28 ENCOUNTER — Ambulatory Visit (HOSPITAL_COMMUNITY)
Admission: RE | Admit: 2018-03-28 | Discharge: 2018-03-28 | Disposition: A | Discharge status: Home / Self Care | Payer: Medicare Other | Source: Ambulatory Visit | Attending: Pulmonary Disease | Admitting: Pulmonary Disease

## 2018-03-28 DIAGNOSIS — Z981 Arthrodesis status: Secondary | ICD-10-CM | POA: Insufficient documentation

## 2018-03-28 DIAGNOSIS — M542 Cervicalgia: Secondary | ICD-10-CM | POA: Insufficient documentation

## 2018-04-05 ENCOUNTER — Other Ambulatory Visit: Payer: Self-pay

## 2018-04-05 ENCOUNTER — Encounter: Payer: Self-pay | Admitting: Gastroenterology

## 2018-04-05 ENCOUNTER — Ambulatory Visit (INDEPENDENT_AMBULATORY_CARE_PROVIDER_SITE_OTHER): Payer: Medicare Other | Admitting: Gastroenterology

## 2018-04-05 ENCOUNTER — Telehealth: Payer: Self-pay

## 2018-04-05 VITALS — BP 116/81 | HR 95 | Temp 97.5°F | Ht 70.0 in | Wt 144.4 lb

## 2018-04-05 DIAGNOSIS — Z86010 Personal history of colonic polyps: Secondary | ICD-10-CM

## 2018-04-05 DIAGNOSIS — K5909 Other constipation: Secondary | ICD-10-CM

## 2018-04-05 MED ORDER — CLENPIQ 10-3.5-12 MG-GM -GM/160ML PO SOLN: 1.0000 | Freq: Once | ORAL | 0 refills | Status: AC

## 2018-04-05 NOTE — Telephone Encounter (Signed)
Tried to call pt to inform of pre-op appt 06/08/18 at 10:00am, no answer, LMOVM. Letter mailed.   Pt called office back and informed of pre-op appt.

## 2018-04-05 NOTE — Assessment & Plan Note (Signed)
Recommend utilizing MiraLAX 17 g every day as needed for constipation.  If no significant however recommend him not waiting 3 to 4 days to start medication after not having a bowel movement, would suggest taking a dose if he goes more than 1 to 2 days without a BM and continue as needed.  Patient reports taking MiraLAX every day causes loose stools.  Advised to avoid constipation especially week preceding colonoscopy.

## 2018-04-05 NOTE — Progress Notes (Signed)
Primary Care Physician:  Sinda Du, MD  Primary Gastroenterologist:  Garfield Cornea, MD   Chief Complaint  Patient presents with  . Colonoscopy    past due for 5 yr tcs  . Constipation    HPI:  Jaime [name] is a 62 y.o. male here to schedule surveillance colonoscopy.  History of adenomatous colon polyps back in July 2013.  He is on chronic pain medication due to significant neck injury back in 2009, fracturing C3-C7.  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  He takes Vicodin every day.  For the most part his bowel movements are pretty good but about twice per month he has to utilize MiraLAX.  He notes that if he takes MiraLAX every single day his stools are too loose.  Usually will start MiraLAX after 3 days without a bowel movement.  Denies blood in the stool.  No abdominal pain.  Overall his weight is been fairly stable.  No heartburn or indigestion.   Current Outpatient Medications  Medication Sig Dispense Refill  . acetaminophen (TYLENOL) 500 MG tablet Take 500 mg by mouth every 6 (six) hours as needed for mild pain or moderate pain.    Marland Kitchen ALPRAZolam (XANAX) 1 MG tablet Take 1 mg by mouth 4 (four) times daily as needed. For nerves     . diphenhydramine-acetaminophen (TYLENOL PM) 25-500 MG TABS tablet Take 1 tablet by mouth at bedtime as needed (sleep).    . DULoxetine (CYMBALTA) 60 MG capsule Take 1 capsule by mouth daily.    Marland Kitchen HYDROcodone-acetaminophen (NORCO) 10-325 MG per tablet Take 1 tablet by mouth 4 (four) times daily.    Marland Kitchen levothyroxine (SYNTHROID, LEVOTHROID) 25 MCG tablet Take 1 tablet by mouth daily.    . meloxicam (MOBIC) 7.5 MG tablet Take 1 tablet by mouth 2 (two) times daily.    . polyethylene glycol powder (GLYCOLAX/MIRALAX) powder Take 17 g by mouth daily as needed for moderate constipation.     . tamsulosin (FLOMAX) 0.4 MG CAPS capsule Take 0.4 mg by mouth daily.     No current facility-administered medications for this visit.     Allergies  as of 04/05/2018 - Review Complete 04/05/2018  Allergen Reaction Noted  . Celebrex [celecoxib] Itching 10/28/2011  . Ciprofloxacin Hives 12/14/2011  . Contrast media [iodinated diagnostic agents]  05/21/2012  . Dilaudid [hydromorphone] Other (See Comments) 05/30/2012  . Latex Hives 11/28/2011  . Morphine and related  12/14/2011  . Sulfa antibiotics Other (See Comments) 10/28/2011    Past Medical History:  Diagnosis Date  . Anxiety   . BPH (benign prostatic hyperplasia)   . Chest pain   . Chronic pain    neck  . Constipation   . Genital herpes   . Hypothyroidism   . Neuropathy   . 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 Tobacco abuse     Past Surgical History:  Procedure Laterality Date  . CHOLECYSTECTOMY  1982  . chordoma  1996  . COLONOSCOPY  2001   pt reports normal at [facility]  . COLONOSCOPY  05/30/2012   Dr. Gala Romney: tubular adenomas removed. next TCS in 5 years.   Marland Kitchen MASTOIDECTOMY  1991  . NECK SURGERY  2009   fracture C3-7    Family History  Problem Relation Age of Onset  . Hypertension Mother   . Heart disease Father   . Lung cancer Father   . Heart disease Sister   . Lung cancer  Brother   . Colon cancer Neg Hx     Social History   Socioeconomic History  . Marital status: Divorced    Spouse name: Not on file  . Number of children: 4  . Years of education: Not on file  . Highest education level: Not on file  Occupational History  . Occupation: disabled  Social Needs  . Financial resource strain: Not on file  . Food insecurity:    Worry: Not on file    Inability: Not on file  . Transportation needs:    Medical: Not on file    Non-medical: Not on file  Tobacco Use  . Smoking status: Current Every Day Smoker    Packs/day: 2.00    Years: 44.00    Pack years: 88.00    Types: Cigarettes  . Smokeless tobacco: Never Used  Substance and Sexual Activity  . Alcohol use: No    Comment: quit about 4 years ago; denied  04/05/18  . Drug use: No  . Sexual activity: Not on file  Lifestyle  . Physical activity:    Days per week: Not on file    Minutes per session: Not on file  . Stress: Not on file  Relationships  . Social connections:    Talks on phone: Not on file    Gets together: Not on file    Attends religious service: Not on file    Active member of club or organization: Not on file    Attends meetings of clubs or organizations: Not on file    Relationship status: Not on file  . Intimate partner violence:    Fear of current or ex partner: Not on file    Emotionally abused: Not on file    Physically abused: Not on file    Forced sexual activity: Not on file  Other Topics Concern  . Not on file  Social History Narrative   Lives w/ significant other      ROS:  General: Negative for anorexia, weight loss, fever, chills, fatigue, weakness. Eyes: Negative for vision changes.  ENT: Negative for hoarseness, difficulty swallowing , nasal congestion. CV: Negative for chest pain, angina, palpitations, dyspnea on exertion, peripheral edema.  Respiratory: Negative for dyspnea at rest, dyspnea on exertion, cough, sputum, wheezing.  GI: See history of present illness. GU:  Negative for dysuria, hematuria, urinary incontinence, urinary frequency, nocturnal urination.  MS: Chronic upper extremity pain Derm: Negative for rash or itching.  Neuro: Negative fo seizure, frequent headaches, memory loss, confusion.  Chronic upper extremity weakness Psych: Negative for anxiety, depression, suicidal ideation, hallucinations.  Endo: Negative for unusual weight change.  Heme: Negative for bruising or bleeding. Allergy: Negative for rash or hives.    Physical Examination:  BP 116/81   Pulse 95   Temp (!) 97.5 F (36.4 C) (Oral)   Ht 5\' 10"  (1.778 m)   Wt 144 lb 6.4 oz (65.5 kg)   BMI 20.72 kg/m    General: Well-nourished, well-developed in no acute distress.  Head: Normocephalic, atraumatic.   Eyes:  Conjunctiva pink, no icterus. Mouth: Oropharyngeal mucosa moist and pink , no lesions erythema or exudate. Neck: Supple without thyromegaly, masses, or lymphadenopathy.  Lungs: Clear to auscultation bilaterally.  Heart: Regular rate and rhythm, no murmurs rubs or gallops.  Abdomen: Bowel sounds are normal, nontender, nondistended, no hepatosplenomegaly or masses, no abdominal bruits or    hernia , no rebound or guarding.   Rectal: Deferred Extremities: No lower extremity edema. No  clubbing or deformities.  Neuro: Alert and oriented x 4 , grossly normal neurologically.  Skin: Warm and dry, no rash or jaundice.   Psych: Alert and cooperative, normal mood and affect.  Labs: Lab Results  Component Value Date   CREATININE 0.54 (L) 08/07/2017   BUN 6 08/07/2017   NA 134 (L) 08/07/2017   K 3.7 08/07/2017   CL 95 (L) 08/07/2017   CO2 27 08/07/2017   Lab Results  Component Value Date   WBC 9.2 08/07/2017   HGB 15.0 08/07/2017   HCT 44.3 08/07/2017   MCV 95.3 08/07/2017   PLT 199 08/07/2017   Lab Results  Component Value Date   ALT 25 07/01/2017   AST 18 07/01/2017   ALKPHOS 83 07/01/2017   BILITOT 0.4 07/01/2017     Imaging Studies: Mr Cervical Spine Wo Contrast  Result Date: 03/28/2018 CLINICAL DATA:  Neck pain, bilateral shoulder pain EXAM: MRI CERVICAL SPINE WITHOUT CONTRAST TECHNIQUE: Multiplanar, multisequence MR imaging of the cervical spine was performed. No intravenous contrast was administered. COMPARISON:  None. FINDINGS: Alignment: Physiologic. Vertebrae: No fracture, evidence of discitis, or bone lesion. Cord: Normal signal and morphology. Posterior Fossa, vertebral arteries, paraspinal tissues: Posterior fossa demonstrates no focal abnormality. Vertebral artery flow voids are maintained. Paraspinal soft tissues are unremarkable. Disc levels: Discs: Anterior cervical fusion from C3 through C7. C2-3: No significant disc bulge. No neural foraminal stenosis. No central canal  stenosis. C3-4: Interbody fusion without significant osseous bridging across the C3-4 disc space concerning for pseudoarthrosis. No neural foraminal stenosis. No central canal stenosis. C4-5: Interbody fusion. No neural foraminal stenosis. No central canal stenosis. C5-6: Interbody fusion. No neural foraminal stenosis. No central canal stenosis. C6-7: Interbody fusion. No neural foraminal stenosis. No central canal stenosis. C7-T1: No significant disc bulge. No neural foraminal stenosis. No central canal stenosis. IMPRESSION: 1. At C3-4 there is interbody fusion without significant osseous bridging across the C3-4 disc space concerning for pseudoarthrosis. Electronically Signed   By: Kathreen Devoid   On: 03/28/2018 15:36

## 2018-04-05 NOTE — Patient Instructions (Signed)
1. Take MiraLAX 1 capful every day as needed for constipation.  I would suggest you take a dose if you miss more than 1 to 2 days without a bowel movement.  Make sure your bowel movements are regular especially the week prior to your colonoscopy. 2. Colonoscopy as scheduled.  See separate instructions.

## 2018-04-05 NOTE — Assessment & Plan Note (Signed)
Colonoscopy in the near future, plan for deep sedation given chronic narcotic use/polypharmacy.  I have discussed the risks, alternatives, benefits with regards to but not limited to the risk of reaction to medication, bleeding, infection, perforation and the patient is agreeable to proceed. Written consent to be obtained.

## 2018-04-05 NOTE — Progress Notes (Signed)
cc'ed to pcp °

## 2018-06-06 NOTE — Patient Instructions (Signed)
Jaime [name]  06/06/2018     @PREFPERIOPPHARMACY @   Your procedure is scheduled on  06/14/2018 .  Report to [facility] at  1200   P.M.  Call this number if you have problems the morning of surgery:  [phone]   Remember:  Do not eat or drink after midnight.  You may drink clear liquids until (follow the instructions given to you) .  Clear liquids allowed are:                    Water, Juice (non-citric and without pulp), Carbonated beverages, Clear Tea, Black Coffee only, Plain Jell-O only, Gatorade and Plain Popsicles only    Take these medicines the morning of surgery with A SIP OF WATER  Xanax (if needed), cymbalta, hydrocodone ( if needed), levothyroxine, mobic ( if needed), flomax.    Do not wear jewelry, make-up or nail polish.  Do not wear lotions, powders, or perfumes, or deodorant.  Do not shave 48 hours prior to surgery.  Men may shave face and neck.  Do not bring valuables to the hospital.  [facility] is not responsible for any belongings or valuables.  Contacts, dentures or bridgework may not be worn into surgery.  Leave your suitcase in the car.  After surgery it may be brought to your room.  For patients admitted to the hospital, discharge time will be determined by your treatment team.  Patients discharged the day of surgery will not be allowed to drive home.   Name and phone number of your driver:   family Special instructions:  Follow the diet and prep instructions given to you by Dr Roseanne Kaufman office.  Please read over the following fact sheets that you were given. Anesthesia Post-op Instructions and Care and Recovery After Surgery       Colonoscopy, Adult A colonoscopy is an exam to look at the large intestine. It is done to check for problems, such as:  Lumps (tumors).  Growths (polyps).  Swelling (inflammation).  Bleeding.  What happens before the procedure? Eating and drinking Follow instructions from your doctor about eating  and drinking. These instructions may include:  A few days before the procedure - follow a low-fiber diet. ? Avoid nuts. ? Avoid seeds. ? Avoid dried fruit. ? Avoid raw fruits. ? Avoid vegetables.  1-3 days before the procedure - follow a clear liquid diet. Avoid liquids that have red or purple dye. Drink only clear liquids, such as: ? Clear broth or bouillon. ? Black coffee or tea. ? Clear juice. ? Clear soft drinks or sports drinks. ? Gelatin dessert. ? Popsicles.  On the day of the procedure - do not eat or drink anything during the 2 hours before the procedure.  Bowel prep If you were prescribed an oral bowel prep:  Take it as told by your doctor. Starting the day before your procedure, you will need to drink a lot of liquid. The liquid will cause you to poop (have bowel movements) until your poop is almost clear or light green.  If your skin or butt gets irritated from diarrhea, you may: ? Wipe the area with wipes that have medicine in them, such as adult wet wipes with aloe and vitamin E. ? Put something on your skin that soothes the area, such as petroleum jelly.  If you throw up (vomit) while drinking the bowel prep, take a break for up to 60 minutes.  Then begin the bowel prep again. If you keep throwing up and you cannot take the bowel prep without throwing up, call your doctor.  General instructions  Ask your doctor about changing or stopping your normal medicines. This is important if you take diabetes medicines or blood thinners.  Plan to have someone take you home from the hospital or clinic. What happens during the procedure?  An IV tube may be put into one of your veins.  You will be given medicine to help you relax (sedative).  To reduce your risk of infection: ? Your doctors will wash their hands. ? Your anal area will be washed with soap.  You will be asked to lie on your side with your knees bent.  Your doctor will get a long, thin, flexible tube  ready. The tube will have a camera and a light on the end.  The tube will be put into your anus.  The tube will be gently put into your large intestine.  Air will be delivered into your large intestine to keep it open. You may feel some pressure or cramping.  The camera will be used to take photos.  A small tissue sample may be removed from your body to be looked at under a microscope (biopsy). If any possible problems are found, the tissue will be sent to a lab for testing.  If small growths are found, your doctor may remove them and have them checked for cancer.  The tube that was put into your anus will be slowly removed. The procedure may vary among doctors and hospitals. What happens after the procedure?  Your doctor will check on you often until the medicines you were given have worn off.  Do not drive for 24 hours after the procedure.  You may have a small amount of blood in your poop.  You may pass gas.  You may have mild cramps or bloating in your belly (abdomen).  It is up to you to get the results of your procedure. Ask your doctor, or the department performing the procedure, when your results will be ready. This information is not intended to replace advice given to you by your health care provider. Make sure you discuss any questions you have with your health care provider. Document Released: 11/26/2010 Document Revised: 08/24/2016 Document Reviewed: 01/05/2016 Elsevier Interactive Patient Education  2017 [employer]  Colonoscopy, Adult, Care After This sheet gives you information about how to care for yourself after your procedure. Your health care provider may also give you more specific instructions. If you have problems or questions, contact your health care provider. What can I expect after the procedure? After the procedure, it is common to have:  A small amount of blood in your stool for 24 hours after the procedure.  Some gas.  Mild abdominal cramping  or bloating.  Follow these instructions at home: General instructions   For the first 24 hours after the procedure: ? Do not drive or use machinery. ? Do not sign important documents. ? Do not drink alcohol. ? Do your regular daily activities at a slower pace than normal. ? Eat soft, easy-to-digest foods. ? Rest often.  Take over-the-counter or prescription medicines only as told by your health care provider.  It is up to you to get the results of your procedure. Ask your health care provider, or the department performing the procedure, when your results will be ready. Relieving cramping and bloating  Try walking around when you have  cramps or feel bloated.  Apply heat to your abdomen as told by your health care provider. Use a heat source that your health care provider recommends, such as a moist heat pack or a heating pad. ? Place a towel between your skin and the heat source. ? Leave the heat on for 20-30 minutes. ? Remove the heat if your skin turns bright red. This is especially important if you are unable to feel pain, heat, or cold. You may have a greater risk of getting burned. Eating and drinking  Drink enough fluid to keep your urine clear or pale yellow.  Resume your normal diet as instructed by your health care provider. Avoid heavy or fried foods that are hard to digest.  Avoid drinking alcohol for as long as instructed by your health care provider. Contact a health care provider if:  You have blood in your stool 2-3 days after the procedure. Get help right away if:  You have more than a small spotting of blood in your stool.  You pass large blood clots in your stool.  Your abdomen is swollen.  You have nausea or vomiting.  You have a fever.  You have increasing abdominal pain that is not relieved with medicine. This information is not intended to replace advice given to you by your health care provider. Make sure you discuss any questions you have with  your health care provider. Document Released: 06/07/2004 Document Revised: 07/18/2016 Document Reviewed: 01/05/2016 Elsevier Interactive Patient Education  2018 [place] Anesthesia is a term that refers to techniques, procedures, and medicines that help a person stay safe and comfortable during a medical procedure. Monitored anesthesia care, or sedation, is one type of anesthesia. Your anesthesia specialist may recommend sedation if you will be having a procedure that does not require you to be unconscious, such as:  Cataract surgery.  A dental procedure.  A biopsy.  A colonoscopy.  During the procedure, you may receive a medicine to help you relax (sedative). There are three levels of sedation:  Mild sedation. At this level, you may feel awake and relaxed. You will be able to follow directions.  Moderate sedation. At this level, you will be sleepy. You may not remember the procedure.  Deep sedation. At this level, you will be asleep. You will not remember the procedure.  The more medicine you are given, the deeper your level of sedation will be. Depending on how you respond to the procedure, the anesthesia specialist may change your level of sedation or the type of anesthesia to fit your needs. An anesthesia specialist will monitor you closely during the procedure. Let your health care provider know about:  Any allergies you have.  All medicines you are taking, including vitamins, herbs, eye drops, creams, and over-the-counter medicines.  Any use of steroids (by mouth or as a cream).  Any problems you or family members have had with sedatives and anesthetic medicines.  Any blood disorders you have.  Any surgeries you have had.  Any medical conditions you have, such as sleep apnea.  Whether you are pregnant or may be pregnant.  Any use of cigarettes, alcohol, or street drugs. What are the risks? Generally, this is a safe procedure. However,  problems may occur, including:  Getting too much medicine (oversedation).  Nausea.  Allergic reaction to medicines.  Trouble breathing. If this happens, a breathing tube may be used to help with breathing. It will be removed when you are awake  and breathing on your own.  Heart trouble.  Lung trouble.  Before the procedure Staying hydrated Follow instructions from your health care provider about hydration, which may include:  Up to 2 hours before the procedure - you may continue to drink clear liquids, such as water, clear fruit juice, black coffee, and plain tea.  Eating and drinking restrictions Follow instructions from your health care provider about eating and drinking, which may include:  8 hours before the procedure - stop eating heavy meals or foods such as meat, fried foods, or fatty foods.  6 hours before the procedure - stop eating light meals or foods, such as toast or cereal.  6 hours before the procedure - stop drinking milk or drinks that contain milk.  2 hours before the procedure - stop drinking clear liquids.  Medicines Ask your health care provider about:  Changing or stopping your regular medicines. This is especially important if you are taking diabetes medicines or blood thinners.  Taking medicines such as aspirin and ibuprofen. These medicines can thin your blood. Do not take these medicines before your procedure if your health care provider instructs you not to.  Tests and exams  You will have a physical exam.  You may have blood tests done to show: ? How well your kidneys and liver are working. ? How well your blood can clot.  General instructions  Plan to have someone take you home from the hospital or clinic.  If you will be going home right after the procedure, plan to have someone with you for 24 hours.  What happens during the procedure?  Your blood pressure, heart rate, breathing, level of pain and overall condition will be  monitored.  An IV tube will be inserted into one of your veins.  Your anesthesia specialist will give you medicines as needed to keep you comfortable during the procedure. This may mean changing the level of sedation.  The procedure will be performed. After the procedure  Your blood pressure, heart rate, breathing rate, and blood oxygen level will be monitored until the medicines you were given have worn off.  Do not drive for 24 hours if you received a sedative.  You may: ? Feel sleepy, clumsy, or nauseous. ? Feel forgetful about what happened after the procedure. ? Have a sore throat if you had a breathing tube during the procedure. ? Vomit. This information is not intended to replace advice given to you by your health care provider. Make sure you discuss any questions you have with your health care provider. Document Released: 07/20/2005 Document Revised: 04/01/2016 Document Reviewed: 02/14/2016 Elsevier Interactive Patient Education  2018 [place], Care After These instructions provide you with information about caring for yourself after your procedure. Your health care provider may also give you more specific instructions. Your treatment has been planned according to current medical practices, but problems sometimes occur. Call your health care provider if you have any problems or questions after your procedure. What can I expect after the procedure? After your procedure, it is common to:  Feel sleepy for several hours.  Feel clumsy and have poor balance for several hours.  Feel forgetful about what happened after the procedure.  Have poor judgment for several hours.  Feel nauseous or vomit.  Have a sore throat if you had a breathing tube during the procedure.  Follow these instructions at home: For at least 24 hours after the procedure:   Do not: ? Participate in  activities in which you could fall or become injured. ? Drive. ? Use  heavy machinery. ? Drink alcohol. ? Take sleeping pills or medicines that cause drowsiness. ? Make important decisions or sign legal documents. ? Take care of children on your own.  Rest. Eating and drinking  Follow the diet that is recommended by your health care provider.  If you vomit, drink water, juice, or soup when you can drink without vomiting.  Make sure you have little or no nausea before eating solid foods. General instructions  Have a responsible adult stay with you until you are awake and alert.  Take over-the-counter and prescription medicines only as told by your health care provider.  If you smoke, do not smoke without supervision.  Keep all follow-up visits as told by your health care provider. This is important. Contact a health care provider if:  You keep feeling nauseous or you keep vomiting.  You feel light-headed.  You develop a rash.  You have a fever. Get help right away if:  You have trouble breathing. This information is not intended to replace advice given to you by your health care provider. Make sure you discuss any questions you have with your health care provider. Document Released: 02/14/2016 Document Revised: 06/15/2016 Document Reviewed: 02/14/2016 Elsevier Interactive Patient Education  [employer].

## 2018-06-08 ENCOUNTER — Other Ambulatory Visit: Payer: Self-pay

## 2018-06-08 ENCOUNTER — Encounter (HOSPITAL_COMMUNITY): Payer: Self-pay

## 2018-06-08 ENCOUNTER — Encounter (HOSPITAL_COMMUNITY)
Admission: RE | Admit: 2018-06-08 | Discharge: 2018-06-08 | Disposition: A | Discharge status: Home / Self Care | Payer: Medicare Other | Source: Ambulatory Visit | Attending: Internal Medicine | Admitting: Internal Medicine

## 2018-06-08 DIAGNOSIS — Z01810 Encounter for preprocedural cardiovascular examination: Secondary | ICD-10-CM | POA: Diagnosis not present

## 2018-06-08 DIAGNOSIS — Z01812 Encounter for preprocedural laboratory examination: Secondary | ICD-10-CM | POA: Diagnosis not present

## 2018-06-08 HISTORY — DX: Unspecified osteoarthritis, unspecified site: M19.90

## 2018-06-08 LAB — CBC WITH DIFFERENTIAL/PLATELET
BASOS ABS: 0 10*3/uL (ref 0.0–0.1)
Basophils Relative: 0 %
Eosinophils Absolute: 0.3 10*3/uL (ref 0.0–0.7)
Eosinophils Relative: 3 %
HEMATOCRIT: 40 % (ref 39.0–52.0)
Hemoglobin: 13.5 g/dL (ref 13.0–17.0)
LYMPHS ABS: 2.6 10*3/uL (ref 0.7–4.0)
LYMPHS PCT: 29 %
MCH: 32.7 pg (ref 26.0–34.0)
MCHC: 33.8 g/dL (ref 30.0–36.0)
MCV: 96.9 fL (ref 78.0–100.0)
MONO ABS: 0.7 10*3/uL (ref 0.1–1.0)
Monocytes Relative: 8 %
NEUTROS ABS: 5.2 10*3/uL (ref 1.7–7.7)
Neutrophils Relative %: 60 %
Platelets: 189 10*3/uL (ref 150–400)
RBC: 4.13 MIL/uL — ABNORMAL LOW (ref 4.22–5.81)
RDW: 13 % (ref 11.5–15.5)
WBC: 8.7 10*3/uL (ref 4.0–10.5)

## 2018-06-08 LAB — BASIC METABOLIC PANEL
ANION GAP: 5 (ref 5–15)
BUN: 6 mg/dL — ABNORMAL LOW (ref 8–23)
CALCIUM: 9.3 mg/dL (ref 8.9–10.3)
CO2: 31 mmol/L (ref 22–32)
Chloride: 99 mmol/L (ref 98–111)
Creatinine, Ser: 0.86 mg/dL (ref 0.61–1.24)
GFR calc Af Amer: >60 mL/min (ref >60)
GFR calc non Af Amer: >60 mL/min (ref >60)
GLUCOSE: 114 mg/dL — AB (ref 70–99)
POTASSIUM: 3.8 mmol/L (ref 3.5–5.1)
Sodium: 135 mmol/L (ref 135–145)

## 2018-06-13 ENCOUNTER — Telehealth: Payer: Self-pay | Admitting: *Deleted

## 2018-06-13 NOTE — Telephone Encounter (Signed)
LMOVM for pt to see if he is able to arrive earlier tomorrow for TCS

## 2018-06-13 NOTE — Telephone Encounter (Signed)
Patient called back. He is aware his new procedure time is 11:15am and will need to arrive at short stay at 9:30am. He is aware tomorrow 06/14/18 he will drink 2nd half of prep at 6:15am. He will stop drinking all liquids at 8:15am. Nothing after this point. He voiced understanding.   Called kim in endo and is aware.

## 2018-06-14 ENCOUNTER — Ambulatory Visit (HOSPITAL_COMMUNITY): Payer: Medicare Other | Admitting: Anesthesiology

## 2018-06-14 ENCOUNTER — Encounter (HOSPITAL_COMMUNITY): Payer: Self-pay | Admitting: *Deleted

## 2018-06-14 ENCOUNTER — Encounter (HOSPITAL_COMMUNITY)
Admission: RE | Disposition: A | Discharge status: Home / Self Care | Payer: Self-pay | Source: Ambulatory Visit | Attending: Internal Medicine

## 2018-06-14 ENCOUNTER — Ambulatory Visit (HOSPITAL_COMMUNITY)
Admission: RE | Admit: 2018-06-14 | Discharge: 2018-06-14 | Disposition: A | Discharge status: Home / Self Care | Payer: Medicare Other | Source: Ambulatory Visit | Attending: Internal Medicine | Admitting: Internal Medicine

## 2018-06-14 DIAGNOSIS — Z91040 Latex allergy status: Secondary | ICD-10-CM | POA: Diagnosis not present

## 2018-06-14 DIAGNOSIS — Z888 Allergy status to other drugs, medicaments and biological substances status: Secondary | ICD-10-CM | POA: Insufficient documentation

## 2018-06-14 DIAGNOSIS — E039 Hypothyroidism, unspecified: Secondary | ICD-10-CM | POA: Diagnosis not present

## 2018-06-14 DIAGNOSIS — M1990 Unspecified osteoarthritis, unspecified site: Secondary | ICD-10-CM | POA: Insufficient documentation

## 2018-06-14 DIAGNOSIS — Z885 Allergy status to narcotic agent status: Secondary | ICD-10-CM | POA: Insufficient documentation

## 2018-06-14 DIAGNOSIS — F17210 Nicotine dependence, cigarettes, uncomplicated: Secondary | ICD-10-CM | POA: Diagnosis not present

## 2018-06-14 DIAGNOSIS — N400 Enlarged prostate without lower urinary tract symptoms: Secondary | ICD-10-CM | POA: Diagnosis not present

## 2018-06-14 DIAGNOSIS — Z882 Allergy status to sulfonamides status: Secondary | ICD-10-CM | POA: Insufficient documentation

## 2018-06-14 DIAGNOSIS — Z801 Family history of malignant neoplasm of trachea, bronchus and lung: Secondary | ICD-10-CM | POA: Diagnosis not present

## 2018-06-14 DIAGNOSIS — F419 Anxiety disorder, unspecified: Secondary | ICD-10-CM | POA: Insufficient documentation

## 2018-06-14 DIAGNOSIS — M542 Cervicalgia: Secondary | ICD-10-CM | POA: Insufficient documentation

## 2018-06-14 DIAGNOSIS — Z1211 Encounter for screening for malignant neoplasm of colon: Secondary | ICD-10-CM | POA: Insufficient documentation

## 2018-06-14 DIAGNOSIS — Z9049 Acquired absence of other specified parts of digestive tract: Secondary | ICD-10-CM | POA: Insufficient documentation

## 2018-06-14 DIAGNOSIS — X58XXXS Exposure to other specified factors, sequela: Secondary | ICD-10-CM | POA: Insufficient documentation

## 2018-06-14 DIAGNOSIS — K5900 Constipation, unspecified: Secondary | ICD-10-CM | POA: Insufficient documentation

## 2018-06-14 DIAGNOSIS — Z881 Allergy status to other antibiotic agents status: Secondary | ICD-10-CM | POA: Insufficient documentation

## 2018-06-14 DIAGNOSIS — J449 Chronic obstructive pulmonary disease, unspecified: Secondary | ICD-10-CM | POA: Insufficient documentation

## 2018-06-14 DIAGNOSIS — S149XXS Injury of unspecified nerves of neck, sequela: Secondary | ICD-10-CM | POA: Insufficient documentation

## 2018-06-14 DIAGNOSIS — Z8249 Family history of ischemic heart disease and other diseases of the circulatory system: Secondary | ICD-10-CM | POA: Diagnosis not present

## 2018-06-14 DIAGNOSIS — G629 Polyneuropathy, unspecified: Secondary | ICD-10-CM | POA: Diagnosis not present

## 2018-06-14 DIAGNOSIS — Z79899 Other long term (current) drug therapy: Secondary | ICD-10-CM | POA: Insufficient documentation

## 2018-06-14 DIAGNOSIS — Z886 Allergy status to analgesic agent status: Secondary | ICD-10-CM | POA: Diagnosis not present

## 2018-06-14 DIAGNOSIS — Z86010 Personal history of colonic polyps: Secondary | ICD-10-CM | POA: Insufficient documentation

## 2018-06-14 DIAGNOSIS — G8929 Other chronic pain: Secondary | ICD-10-CM | POA: Diagnosis not present

## 2018-06-14 HISTORY — PX: COLONOSCOPY WITH PROPOFOL: SHX5780

## 2018-06-14 SURGERY — COLONOSCOPY WITH PROPOFOL
Anesthesia: General

## 2018-06-14 MED ORDER — PROPOFOL 10 MG/ML IV BOLUS
INTRAVENOUS | Status: AC
  Filled 2018-06-14: qty 40

## 2018-06-14 MED ORDER — MIDAZOLAM HCL 5 MG/5ML IJ SOLN
INTRAMUSCULAR | Status: DC | PRN
  Administered 2018-06-14: 2 mg via INTRAVENOUS

## 2018-06-14 MED ORDER — PROPOFOL 500 MG/50ML IV EMUL
INTRAVENOUS | Status: DC | PRN
  Administered 2018-06-14: 150 ug/kg/min via INTRAVENOUS

## 2018-06-14 MED ORDER — PROPOFOL 10 MG/ML IV BOLUS
INTRAVENOUS | Status: DC | PRN
  Administered 2018-06-14 (×2): 20 mg via INTRAVENOUS

## 2018-06-14 MED ORDER — LACTATED RINGERS IV SOLN
INTRAVENOUS | Status: DC
  Administered 2018-06-14: 11:00:00 via INTRAVENOUS

## 2018-06-14 MED ORDER — MIDAZOLAM HCL 2 MG/2ML IJ SOLN
INTRAMUSCULAR | Status: AC
  Filled 2018-06-14: qty 2

## 2018-06-14 MED ORDER — STERILE WATER FOR IRRIGATION IR SOLN
Status: DC | PRN
  Administered 2018-06-14: 100 mL

## 2018-06-14 NOTE — H&P (Signed)
@LOGO @   Primary Care Physician:  Sinda Du, MD Primary Gastroenterologist:  Dr. Gala Romney  Pre-Procedure History & Physical: HPI:  Jaime [name] is a 62 y.o. male here for history of colonic adenoma; here for surveillance colonoscopy.  Past Medical History:  Diagnosis Date  . Anxiety   . Arthritis   . BPH (benign prostatic hyperplasia)   . Chest pain   . Chronic pain    neck  . Constipation   . Genital herpes   . Hypothyroidism   . Neuropathy   . 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 Tobacco abuse     Past Surgical History:  Procedure Laterality Date  . CHOLECYSTECTOMY  1982  . chordoma  1996  . COLONOSCOPY  2001   pt reports [facility] at [facility]  . COLONOSCOPY  05/30/2012   Dr. Gala Romney: tubular adenomas removed. next TCS in 5 years.   Marland Kitchen MASTOIDECTOMY  1991  . NECK SURGERY  2009   fracture C3-7    Prior to Admission medications   Medication Sig Start Date End Date Taking? Authorizing Provider  acetaminophen (TYLENOL) 500 MG tablet Take 500 mg by mouth daily as needed for mild pain or moderate pain.    Yes [provider]  ALPRAZolam Duanne Moron) 1 MG tablet Take 1 mg by mouth 3 (three) times daily. For nerves    Yes [provider]  DULoxetine (CYMBALTA) 60 MG capsule Take 60 mg by mouth daily.  03/15/18  Yes [provider]  HYDROcodone-acetaminophen (NORCO) 10-325 MG per tablet Take 1 tablet by mouth 4 (four) times daily.   Yes [provider]  levothyroxine (SYNTHROID, LEVOTHROID) 25 MCG tablet Take 25 mcg by mouth daily.  03/29/18  Yes [provider]  meloxicam (MOBIC) 7.5 MG tablet Take 7.5 mg by mouth 2 (two) times daily.   Yes [provider]  mometasone (ELOCON) 0.1 % cream Apply 1 application topically daily as needed (eczema).   Yes [provider]  mupirocin cream (BACTROBAN) 2 % Apply 1 application topically daily as needed (sores). Apply to ears   Yes [provider]  tamsulosin (FLOMAX) 0.4 MG CAPS capsule Take 0.4 mg by mouth daily.   Yes [provider]    Allergies as of 04/05/2018 - Review Complete 04/05/2018  Allergen Reaction Noted  . Celebrex [celecoxib] Itching 10/28/2011  . Ciprofloxacin Hives 12/14/2011  . Contrast media [iodinated diagnostic agents]  05/21/2012  . Dilaudid [hydromorphone] Other (See Comments) 05/30/2012  . Latex Hives 11/28/2011  . Morphine and related  12/14/2011  . Sulfa antibiotics Other (See Comments) 10/28/2011    Family History  Problem Relation Age of Onset  . Hypertension Mother   . Heart disease Father   . Lung cancer Father   . Heart disease Sister   . Lung cancer Brother   . Colon cancer Neg Hx     Social History   Socioeconomic History  . Marital status: Divorced    Spouse name: Not on file  . Number of children: 4  . Years of education: Not on file  . Highest education level: Not on file  Occupational History  . Occupation: disabled  Social Needs  . Financial resource strain: Not on file  . Food insecurity:    Worry: Not on file    Inability: Not on file  . Transportation needs:    Medical: Not on file    Non-medical: Not on file  Tobacco  Use  . Smoking status: Current Every Day Smoker    Packs/day: 1.50    Years: 44.00    Pack years: 66.00    Types: Cigarettes  . Smokeless tobacco: Never Used  Substance and Sexual Activity  . Alcohol use: No    Comment: quit about 4 years ago; denied 04/05/18  . Drug use: No  . Sexual activity: Not Currently    Birth control/protection: None  Lifestyle  . Physical activity:    Days per week: Not on file    Minutes per session: Not on file  . Stress: Not on file  Relationships  . Social connections:    Talks on phone: Not on file    Gets together: Not on file    Attends religious service: Not on file    Active member of club or organization: Not on file    Attends meetings of clubs or organizations: Not on file     Relationship status: Not on file  . Intimate partner violence:    Fear of current or ex partner: Not on file    Emotionally abused: Not on file    Physically abused: Not on file    Forced sexual activity: Not on file  Other Topics Concern  . Not on file  Social History Narrative   Lives w/ significant other    Review of Systems: See HPI, otherwise negative ROS  Physical Exam: BP (!) 146/87   Pulse 73   Temp 97.7 F (36.5 C) (Oral)   Resp (!) 9   SpO2 100%  General:   Alert,  Well-developed, well-nourished, pleasant and cooperative in NAD Mouth:  No deformity or lesions. Neck:  Supple; no masses or thyromegaly. No significant cervical adenopathy. Lungs:  Clear throughout to auscultation.   No wheezes, crackles, or rhonchi. No acute distress. Heart:  Regular rate and rhythm; no murmurs, clicks, rubs,  or gallops. Abdomen: Non-distended, normal bowel sounds.  Soft and nontender without appreciable mass or hepatosplenomegaly.  Pulses:  Normal pulses noted. Extremities:  Without clubbing or edema.  Impression/Plan:   62 year old gentleman history of colonic adenoma; here for surveillance colonoscopy. The risks, benefits, limitations, alternatives and imponderables have been reviewed with the patient. Questions have been answered. All parties are agreeable.      Notice: This dictation was prepared with Dragon dictation along with smaller phrase technology. Any transcriptional errors that result from this process are unintentional and may not be corrected upon review.

## 2018-06-14 NOTE — Discharge Instructions (Signed)
Colonoscopy Discharge Instructions  Read the instructions outlined below and refer to this sheet in the next few weeks. These discharge instructions provide you with general information on caring for yourself after you leave the hospital. Your doctor may also give you specific instructions. While your treatment has been planned according to the most current medical practices available, unavoidable complications occasionally occur. If you have any problems or questions after discharge, call Dr. Gala Romney at [phone]. ACTIVITY  You may resume your regular activity, but move at a slower pace for the next 24 hours.   Take frequent rest periods for the next 24 hours.   Walking will help get rid of the air and reduce the bloated feeling in your belly (abdomen).   No driving for 24 hours (because of the medicine (anesthesia) used during the test).    Do not sign any important legal documents or operate any machinery for 24 hours (because of the anesthesia used during the test).  NUTRITION  Drink plenty of fluids.   You may resume your normal diet as instructed by your doctor.   Begin with a light meal and progress to your normal diet. Heavy or fried foods are harder to digest and may make you feel sick to your stomach (nauseated).   Avoid alcoholic beverages for 24 hours or as instructed.  MEDICATIONS  You may resume your normal medications unless your doctor tells you otherwise.  WHAT YOU CAN EXPECT TODAY  Some feelings of bloating in the abdomen.   Passage of more gas than usual.   Spotting of blood in your stool or on the toilet paper.  IF YOU HAD POLYPS REMOVED DURING THE COLONOSCOPY:  No aspirin products for 7 days or as instructed.   No alcohol for 7 days or as instructed.   Eat a soft diet for the next 24 hours.  FINDING OUT THE RESULTS OF YOUR TEST Not all test results are available during your visit. If your test results are not back during the visit, make an appointment  with your caregiver to find out the results. Do not assume everything is normal if you have not heard from your caregiver or the medical facility. It is important for you to follow up on all of your test results.  SEEK IMMEDIATE MEDICAL ATTENTION IF:  You have more than a spotting of blood in your stool.   Your belly is swollen (abdominal distention).   You are nauseated or vomiting.   You have a temperature over 101.   You have abdominal pain or discomfort that is severe or gets worse throughout the day.    Repeat colonoscopy in 5 years      Monitored Anesthesia Care, Care After These instructions provide you with information about caring for yourself after your procedure. Your health care provider may also give you more specific instructions. Your treatment has been planned according to current medical practices, but problems sometimes occur. Call your health care provider if you have any problems or questions after your procedure. What can I expect after the procedure? After your procedure, it is common to:  Feel sleepy for several hours.  Feel clumsy and have poor balance for several hours.  Feel forgetful about what happened after the procedure.  Have poor judgment for several hours.  Feel nauseous or vomit.  Have a sore throat if you had a breathing tube during the procedure.  Follow these instructions at home: For at least 24 hours after the procedure:   Do  not: ? Participate in activities in which you could fall or become injured. ? Drive. ? Use heavy machinery. ? Drink alcohol. ? Take sleeping pills or medicines that cause drowsiness. ? Make important decisions or sign legal documents. ? Take care of children on your own.  Rest. Eating and drinking  Follow the diet that is recommended by your health care provider.  If you vomit, drink water, juice, or soup when you can drink without vomiting.  Make sure you have little or no nausea before eating solid  foods. General instructions  Have a responsible adult stay with you until you are awake and alert.  Take over-the-counter and prescription medicines only as told by your health care provider.  If you smoke, do not smoke without supervision.  Keep all follow-up visits as told by your health care provider. This is important. Contact a health care provider if:  You keep feeling nauseous or you keep vomiting.  You feel light-headed.  You develop a rash.  You have a fever. Get help right away if:  You have trouble breathing. This information is not intended to replace advice given to you by your health care provider. Make sure you discuss any questions you have with your health care provider. Document Released: 02/14/2016 Document Revised: 06/15/2016 Document Reviewed: 02/14/2016 Elsevier Interactive Patient Education  [employer].

## 2018-06-14 NOTE — Op Note (Signed)
[facility] Patient Name: Jaime [name] Procedure Date: 06/14/2018 12:20 PM MRN: [id number] Date of Birth: [birth year] Attending MD: Norvel Richards , MD CSN: [id number] Age: 62 Admit Type: Outpatient Procedure:                Colonoscopy Indications:              High risk colon cancer surveillance: Personal                            history of colonic polyps Providers:                Norvel Richards, MD, Rosina Lowenstein, RN, Randa Spike, Technician Referring MD:              Medicines:                Midazolam mg IV Complications:            No immediate complications. Estimated Blood Loss:     Estimated blood loss: none. Procedure:                Pre-Anesthesia Assessment:                           - Prior to the procedure, a History and Physical                            was performed, and patient medications and                            allergies were reviewed. The patient's tolerance of                            previous anesthesia was also reviewed. The risks                            and benefits of the procedure and the sedation                            options and risks were discussed with the patient.                            All questions were answered, and informed consent                            was obtained. Prior Anticoagulants: The patient has                            taken no previous anticoagulant or antiplatelet                            agents. ASA Grade Assessment: III - A patient with  severe systemic disease. After reviewing the risks                            and benefits, the patient was deemed in                            satisfactory condition to undergo the procedure.                           After obtaining informed consent, the colonoscope                            was passed under direct vision. Throughout the                            procedure, the patient's blood  pressure, pulse, and                            oxygen saturations were monitored continuously. The                            CF-[id number] ([id number]) scope was introduced through                            the and advanced to the the cecum, identified by                            appendiceal orifice and ileocecal valve. The                            colonoscopy was performed without difficulty. The                            patient tolerated the procedure well. The quality                            of the bowel preparation was adequate. Scope In: 12:33:30 PM Scope Out: 12:43:37 PM Scope Withdrawal Time: 0 hours 7 minutes 26 seconds  Total Procedure Duration: 0 hours 10 minutes 7 seconds  Findings:      The perianal and digital rectal examinations were normal.      The entire examined colon appeared normal on direct and retroflexion       views. Impression:               - The entire examined colon is normal on direct and                            retroflexion views.                           - No specimens collected. Moderate Sedation:      Moderate (conscious) sedation was personally administered by an       anesthesia professional. The following parameters were monitored: oxygen       saturation, heart rate, blood pressure, respiratory rate,  EKG, adequacy       of pulmonary ventilation, and response to care. Total physician       intraservice time was 14 minutes. Recommendation:           - Patient has a contact number available for                            emergencies. The signs and symptoms of potential                            delayed complications were discussed with the                            patient. Return to normal activities tomorrow.                            Written discharge instructions were provided to the                            patient.                           - Resume previous diet.                           - Continue present medications.                            - Repeat colonoscopy in 5 years for surveillance.                           - Return to GI office PRN. Procedure Code(s):        --- Professional ---                           [phone], Colonoscopy, flexible; diagnostic, including                            collection of specimen(s) by brushing or washing,                            when performed (separate procedure) Diagnosis Code(s):        --- Professional ---                           Z86.010, Personal history of colonic polyps CPT copyright 2017 American Medical Association. All rights reserved. The codes documented in this report are preliminary and upon coder review may  be revised to meet current compliance requirements. Cristopher Estimable. Telesia Ates, MD Norvel Richards, MD 06/14/2018 12:48:58 PM This report has been signed electronically. Number of Addenda: 0

## 2018-06-14 NOTE — Anesthesia Postprocedure Evaluation (Signed)
Anesthesia Post Note  Patient: Jaime [name]  Procedure(s) Performed: COLONOSCOPY WITH PROPOFOL (N/A )  Patient location during evaluation: PACU Anesthesia Type: General Level of consciousness: awake and alert and oriented Pain management: pain level controlled Vital Signs Assessment: post-procedure vital signs reviewed and stable Respiratory status: spontaneous breathing Cardiovascular status: stable and blood pressure returned to baseline Postop Assessment: no apparent nausea or vomiting Anesthetic complications: no     Last Vitals:  Vitals:   06/14/18 1200 06/14/18 1251  BP:  (P) 104/66  Pulse:  (P) 72  Resp: (!) 9 (P) 12  Temp:  (P) 36.6 C  SpO2: 100% (P) 99%    Last Pain:  Vitals:   06/14/18 1244  TempSrc:   PainSc: 0-No pain                 Mailin Coglianese

## 2018-06-14 NOTE — Anesthesia Preprocedure Evaluation (Signed)
Anesthesia Evaluation  Patient identified by MRN, date of birth, ID band Patient awake    Reviewed: Allergy & Precautions, NPO status , Patient's Chart, lab work & pertinent test results  Airway Mallampati: I  TM Distance: >3 FB Neck ROM: Full    Dental no notable dental hx. (+) Edentulous Upper, Edentulous Lower   Pulmonary neg pulmonary ROS, COPD, Current Smoker,  Denies COPD or meds - still smoking 1ppd for many years    Pulmonary exam normal breath sounds clear to auscultation       Cardiovascular Exercise Tolerance: Poor negative cardio ROS Normal cardiovascular examI Rhythm:Regular Rate:Normal     Neuro/Psych Anxiety negative neurological ROS  negative psych ROS   GI/Hepatic negative GI ROS, Neg liver ROS,   Endo/Other  Hypothyroidism   Renal/GU negative Renal ROS  negative genitourinary   Musculoskeletal negative musculoskeletal ROS (+) Arthritis , Osteoarthritis,    Abdominal   Peds negative pediatric ROS (+)  Hematology negative hematology ROS (+)   Anesthesia Other Findings   Reproductive/Obstetrics negative OB ROS                             Anesthesia Physical Anesthesia Plan  ASA: II  Anesthesia Plan: General   Post-op Pain Management:    Induction: Intravenous  PONV Risk Score and Plan:   Airway Management Planned: Simple Face Mask  Additional Equipment:   Intra-op Plan:   Post-operative Plan:   Informed Consent: I have reviewed the patients History and Physical, chart, labs and discussed the procedure including the risks, benefits and alternatives for the proposed anesthesia with the patient or authorized representative who has indicated his/her understanding and acceptance.     Plan Discussed with: CRNA  Anesthesia Plan Comments:         Anesthesia Quick Evaluation

## 2018-06-14 NOTE — Transfer of Care (Signed)
Immediate Anesthesia Transfer of Care Note  Patient: Jaime [name]  Procedure(s) Performed: COLONOSCOPY WITH PROPOFOL (N/A )  Patient Location: PACU  Anesthesia Type:MAC  Level of Consciousness: awake, alert  and oriented  Airway & Oxygen Therapy: Patient Spontanous Breathing  Post-op Assessment: Report given to RN  Post vital signs: Reviewed  Last Vitals:  Vitals Value Taken Time  BP    Temp    Pulse    Resp    SpO2      Last Pain:  Vitals:   06/14/18 1244  TempSrc:   PainSc: 0-No pain         Complications: No apparent anesthesia complications

## 2018-06-19 ENCOUNTER — Encounter (HOSPITAL_COMMUNITY): Payer: Self-pay | Admitting: Internal Medicine

## 2018-08-01 DIAGNOSIS — G8250 Quadriplegia, unspecified: Secondary | ICD-10-CM | POA: Diagnosis not present

## 2018-08-01 DIAGNOSIS — J449 Chronic obstructive pulmonary disease, unspecified: Secondary | ICD-10-CM | POA: Diagnosis not present

## 2018-09-27 DIAGNOSIS — M545 Low back pain: Secondary | ICD-10-CM | POA: Diagnosis not present

## 2018-09-27 DIAGNOSIS — R413 Other amnesia: Secondary | ICD-10-CM | POA: Diagnosis not present

## 2018-09-27 DIAGNOSIS — G8250 Quadriplegia, unspecified: Secondary | ICD-10-CM | POA: Diagnosis not present

## 2018-12-03 DIAGNOSIS — Z79891 Long term (current) use of opiate analgesic: Secondary | ICD-10-CM | POA: Diagnosis not present

## 2018-12-03 DIAGNOSIS — J449 Chronic obstructive pulmonary disease, unspecified: Secondary | ICD-10-CM | POA: Diagnosis not present

## 2018-12-03 DIAGNOSIS — M545 Low back pain: Secondary | ICD-10-CM | POA: Diagnosis not present

## 2018-12-03 DIAGNOSIS — G8250 Quadriplegia, unspecified: Secondary | ICD-10-CM | POA: Diagnosis not present

## 2018-12-03 DIAGNOSIS — S199XXD Unspecified injury of neck, subsequent encounter: Secondary | ICD-10-CM | POA: Diagnosis not present

## 2018-12-03 DIAGNOSIS — M542 Cervicalgia: Secondary | ICD-10-CM | POA: Diagnosis not present

## 2018-12-03 DIAGNOSIS — B880 Other acariasis: Secondary | ICD-10-CM | POA: Diagnosis not present

## 2019-05-14 DIAGNOSIS — G8250 Quadriplegia, unspecified: Secondary | ICD-10-CM | POA: Diagnosis not present

## 2019-05-14 DIAGNOSIS — M542 Cervicalgia: Secondary | ICD-10-CM | POA: Diagnosis not present

## 2019-06-24 DIAGNOSIS — M545 Low back pain: Secondary | ICD-10-CM | POA: Diagnosis not present

## 2019-06-24 DIAGNOSIS — G8250 Quadriplegia, unspecified: Secondary | ICD-10-CM | POA: Diagnosis not present

## 2019-08-29 DIAGNOSIS — E039 Hypothyroidism, unspecified: Secondary | ICD-10-CM | POA: Diagnosis not present

## 2019-08-29 DIAGNOSIS — J302 Other seasonal allergic rhinitis: Secondary | ICD-10-CM | POA: Diagnosis not present

## 2019-08-29 DIAGNOSIS — G894 Chronic pain syndrome: Secondary | ICD-10-CM | POA: Diagnosis not present

## 2019-08-29 DIAGNOSIS — M19011 Primary osteoarthritis, right shoulder: Secondary | ICD-10-CM | POA: Diagnosis not present

## 2019-08-29 DIAGNOSIS — Z23 Encounter for immunization: Secondary | ICD-10-CM | POA: Diagnosis not present

## 2019-08-30 ENCOUNTER — Other Ambulatory Visit (HOSPITAL_COMMUNITY): Payer: Self-pay | Admitting: Internal Medicine

## 2019-08-30 DIAGNOSIS — G603 Idiopathic progressive neuropathy: Secondary | ICD-10-CM | POA: Diagnosis not present

## 2019-08-30 DIAGNOSIS — G894 Chronic pain syndrome: Secondary | ICD-10-CM | POA: Diagnosis not present

## 2019-08-30 DIAGNOSIS — M79604 Pain in right leg: Secondary | ICD-10-CM | POA: Diagnosis not present

## 2019-08-30 DIAGNOSIS — Z136 Encounter for screening for cardiovascular disorders: Secondary | ICD-10-CM

## 2019-08-30 DIAGNOSIS — M79603 Pain in arm, unspecified: Secondary | ICD-10-CM | POA: Diagnosis not present

## 2019-08-30 DIAGNOSIS — F17209 Nicotine dependence, unspecified, with unspecified nicotine-induced disorders: Secondary | ICD-10-CM

## 2019-08-30 DIAGNOSIS — M542 Cervicalgia: Secondary | ICD-10-CM | POA: Diagnosis not present

## 2019-09-11 ENCOUNTER — Ambulatory Visit (HOSPITAL_COMMUNITY): Payer: Medicare Other

## 2019-09-11 DIAGNOSIS — R7303 Prediabetes: Secondary | ICD-10-CM | POA: Diagnosis not present

## 2019-09-11 DIAGNOSIS — Z79899 Other long term (current) drug therapy: Secondary | ICD-10-CM | POA: Diagnosis not present

## 2019-09-11 DIAGNOSIS — D519 Vitamin B12 deficiency anemia, unspecified: Secondary | ICD-10-CM | POA: Diagnosis not present

## 2019-09-11 DIAGNOSIS — E559 Vitamin D deficiency, unspecified: Secondary | ICD-10-CM | POA: Diagnosis not present

## 2019-09-11 DIAGNOSIS — R5383 Other fatigue: Secondary | ICD-10-CM | POA: Diagnosis not present

## 2019-09-12 ENCOUNTER — Other Ambulatory Visit: Payer: Self-pay

## 2019-09-12 ENCOUNTER — Ambulatory Visit (HOSPITAL_COMMUNITY)
Admission: RE | Admit: 2019-09-12 | Discharge: 2019-09-12 | Disposition: A | Discharge status: Home / Self Care | Payer: Medicare Other | Source: Ambulatory Visit | Attending: Internal Medicine | Admitting: Internal Medicine

## 2019-09-12 DIAGNOSIS — I700 Atherosclerosis of aorta: Secondary | ICD-10-CM | POA: Diagnosis not present

## 2019-09-12 DIAGNOSIS — Z136 Encounter for screening for cardiovascular disorders: Secondary | ICD-10-CM | POA: Diagnosis not present

## 2019-09-12 DIAGNOSIS — J439 Emphysema, unspecified: Secondary | ICD-10-CM | POA: Diagnosis not present

## 2019-09-12 DIAGNOSIS — Z122 Encounter for screening for malignant neoplasm of respiratory organs: Secondary | ICD-10-CM | POA: Insufficient documentation

## 2019-09-12 DIAGNOSIS — F17210 Nicotine dependence, cigarettes, uncomplicated: Secondary | ICD-10-CM | POA: Insufficient documentation

## 2019-09-12 DIAGNOSIS — F17209 Nicotine dependence, unspecified, with unspecified nicotine-induced disorders: Secondary | ICD-10-CM

## 2019-09-19 DIAGNOSIS — G894 Chronic pain syndrome: Secondary | ICD-10-CM | POA: Diagnosis not present

## 2019-09-19 DIAGNOSIS — Z0001 Encounter for general adult medical examination with abnormal findings: Secondary | ICD-10-CM | POA: Diagnosis not present

## 2019-09-19 DIAGNOSIS — E039 Hypothyroidism, unspecified: Secondary | ICD-10-CM | POA: Diagnosis not present

## 2019-09-19 DIAGNOSIS — Z720 Tobacco use: Secondary | ICD-10-CM | POA: Diagnosis not present

## 2019-09-19 DIAGNOSIS — J302 Other seasonal allergic rhinitis: Secondary | ICD-10-CM | POA: Diagnosis not present

## 2019-09-25 DIAGNOSIS — G603 Idiopathic progressive neuropathy: Secondary | ICD-10-CM | POA: Diagnosis not present

## 2019-09-25 DIAGNOSIS — G894 Chronic pain syndrome: Secondary | ICD-10-CM | POA: Diagnosis not present

## 2019-09-25 DIAGNOSIS — G8929 Other chronic pain: Secondary | ICD-10-CM | POA: Diagnosis not present

## 2019-09-25 DIAGNOSIS — M79604 Pain in right leg: Secondary | ICD-10-CM | POA: Diagnosis not present

## 2019-09-25 DIAGNOSIS — M542 Cervicalgia: Secondary | ICD-10-CM | POA: Diagnosis not present

## 2019-10-07 DIAGNOSIS — R05 Cough: Secondary | ICD-10-CM | POA: Diagnosis not present

## 2019-10-07 DIAGNOSIS — Z0001 Encounter for general adult medical examination with abnormal findings: Secondary | ICD-10-CM | POA: Diagnosis not present

## 2019-10-07 DIAGNOSIS — R0981 Nasal congestion: Secondary | ICD-10-CM | POA: Diagnosis not present

## 2019-10-07 DIAGNOSIS — Z720 Tobacco use: Secondary | ICD-10-CM | POA: Diagnosis not present

## 2019-10-07 DIAGNOSIS — E039 Hypothyroidism, unspecified: Secondary | ICD-10-CM | POA: Diagnosis not present

## 2019-10-16 ENCOUNTER — Ambulatory Visit (HOSPITAL_COMMUNITY)
Admission: RE | Admit: 2019-10-16 | Discharge: 2019-10-16 | Disposition: A | Discharge status: Home / Self Care | Payer: Medicare Other | Source: Ambulatory Visit | Attending: Internal Medicine | Admitting: Internal Medicine

## 2019-10-16 ENCOUNTER — Other Ambulatory Visit: Payer: Self-pay

## 2019-10-16 ENCOUNTER — Other Ambulatory Visit (HOSPITAL_COMMUNITY): Payer: Self-pay | Admitting: Internal Medicine

## 2019-10-16 DIAGNOSIS — R05 Cough: Secondary | ICD-10-CM | POA: Diagnosis not present

## 2019-10-16 DIAGNOSIS — R059 Cough, unspecified: Secondary | ICD-10-CM

## 2019-10-22 DIAGNOSIS — G8929 Other chronic pain: Secondary | ICD-10-CM | POA: Diagnosis not present

## 2019-10-22 DIAGNOSIS — G603 Idiopathic progressive neuropathy: Secondary | ICD-10-CM | POA: Diagnosis not present

## 2019-10-22 DIAGNOSIS — M79604 Pain in right leg: Secondary | ICD-10-CM | POA: Diagnosis not present

## 2019-10-22 DIAGNOSIS — M79603 Pain in arm, unspecified: Secondary | ICD-10-CM | POA: Diagnosis not present

## 2019-11-04 DIAGNOSIS — R0981 Nasal congestion: Secondary | ICD-10-CM | POA: Diagnosis not present

## 2019-11-04 DIAGNOSIS — R05 Cough: Secondary | ICD-10-CM | POA: Diagnosis not present

## 2019-11-04 DIAGNOSIS — E039 Hypothyroidism, unspecified: Secondary | ICD-10-CM | POA: Diagnosis not present

## 2019-11-04 DIAGNOSIS — J449 Chronic obstructive pulmonary disease, unspecified: Secondary | ICD-10-CM | POA: Diagnosis not present

## 2019-11-04 DIAGNOSIS — G894 Chronic pain syndrome: Secondary | ICD-10-CM | POA: Diagnosis not present

## 2019-11-19 DIAGNOSIS — G8929 Other chronic pain: Secondary | ICD-10-CM | POA: Diagnosis not present

## 2019-11-19 DIAGNOSIS — M79604 Pain in right leg: Secondary | ICD-10-CM | POA: Diagnosis not present

## 2019-11-19 DIAGNOSIS — M79603 Pain in arm, unspecified: Secondary | ICD-10-CM | POA: Diagnosis not present

## 2019-11-19 DIAGNOSIS — M542 Cervicalgia: Secondary | ICD-10-CM | POA: Diagnosis not present

## 2019-11-21 DIAGNOSIS — M62831 Muscle spasm of calf: Secondary | ICD-10-CM | POA: Diagnosis not present

## 2019-11-21 DIAGNOSIS — K5900 Constipation, unspecified: Secondary | ICD-10-CM | POA: Diagnosis not present

## 2019-11-21 DIAGNOSIS — B370 Candidal stomatitis: Secondary | ICD-10-CM | POA: Diagnosis not present

## 2019-11-23 ENCOUNTER — Other Ambulatory Visit: Payer: Self-pay

## 2019-11-23 ENCOUNTER — Emergency Department (HOSPITAL_COMMUNITY): Payer: Medicare Other

## 2019-11-23 ENCOUNTER — Encounter (HOSPITAL_COMMUNITY): Payer: Self-pay | Admitting: Emergency Medicine

## 2019-11-23 ENCOUNTER — Emergency Department (HOSPITAL_COMMUNITY)
Admission: EM | Admit: 2019-11-23 | Discharge: 2019-11-24 | Disposition: A | Discharge status: Home / Self Care | Payer: Medicare Other | Attending: Emergency Medicine | Admitting: Emergency Medicine

## 2019-11-23 DIAGNOSIS — K5900 Constipation, unspecified: Secondary | ICD-10-CM | POA: Insufficient documentation

## 2019-11-23 DIAGNOSIS — F17210 Nicotine dependence, cigarettes, uncomplicated: Secondary | ICD-10-CM | POA: Insufficient documentation

## 2019-11-23 DIAGNOSIS — Z79899 Other long term (current) drug therapy: Secondary | ICD-10-CM | POA: Diagnosis not present

## 2019-11-23 DIAGNOSIS — E039 Hypothyroidism, unspecified: Secondary | ICD-10-CM | POA: Diagnosis not present

## 2019-11-23 LAB — URINALYSIS, ROUTINE W REFLEX MICROSCOPIC
Bacteria, UA: NONE SEEN
Bilirubin Urine: NEGATIVE
Glucose, UA: NEGATIVE mg/dL
Ketones, ur: NEGATIVE mg/dL
Leukocytes,Ua: NEGATIVE
Nitrite: NEGATIVE
Protein, ur: NEGATIVE mg/dL
Specific Gravity, Urine: 1.005 (ref 1.005–1.030)
pH: 7 (ref 5.0–8.0)

## 2019-11-23 LAB — CBC WITH DIFFERENTIAL/PLATELET
Abs Immature Granulocytes: 0.04 10*3/uL (ref 0.00–0.07)
Basophils Absolute: 0 10*3/uL (ref 0.0–0.1)
Basophils Relative: 0 %
Eosinophils Absolute: 0.1 10*3/uL (ref 0.0–0.5)
Eosinophils Relative: 1 %
HCT: 45.8 % (ref 39.0–52.0)
Hemoglobin: 14.9 g/dL (ref 13.0–17.0)
Immature Granulocytes: 0 %
Lymphocytes Relative: 34 %
Lymphs Abs: 3.8 10*3/uL (ref 0.7–4.0)
MCH: 32.3 pg (ref 26.0–34.0)
MCHC: 32.5 g/dL (ref 30.0–36.0)
MCV: 99.1 fL (ref 80.0–100.0)
Monocytes Absolute: 0.8 10*3/uL (ref 0.1–1.0)
Monocytes Relative: 7 %
Neutro Abs: 6.5 10*3/uL (ref 1.7–7.7)
Neutrophils Relative %: 58 %
Platelets: 241 10*3/uL (ref 150–400)
RBC: 4.62 MIL/uL (ref 4.22–5.81)
RDW: 12.6 % (ref 11.5–15.5)
WBC: 11.3 10*3/uL — ABNORMAL HIGH (ref 4.0–10.5)
nRBC: 0 % (ref 0.0–0.2)

## 2019-11-23 LAB — COMPREHENSIVE METABOLIC PANEL
ALT: 13 U/L (ref 0–44)
AST: 16 U/L (ref 15–41)
Albumin: 4.4 g/dL (ref 3.5–5.0)
Alkaline Phosphatase: 97 U/L (ref 38–126)
Anion gap: 10 (ref 5–15)
BUN: 9 mg/dL (ref 8–23)
CO2: 28 mmol/L (ref 22–32)
Calcium: 9.7 mg/dL (ref 8.9–10.3)
Chloride: 101 mmol/L (ref 98–111)
Creatinine, Ser: 0.64 mg/dL (ref 0.61–1.24)
GFR calc Af Amer: >60 mL/min (ref >60)
GFR calc non Af Amer: >60 mL/min (ref >60)
Glucose, Bld: 95 mg/dL (ref 70–99)
Potassium: 3.6 mmol/L (ref 3.5–5.1)
Sodium: 139 mmol/L (ref 135–145)
Total Bilirubin: 0.3 mg/dL (ref 0.3–1.2)
Total Protein: 8.3 g/dL — ABNORMAL HIGH (ref 6.5–8.1)

## 2019-11-23 LAB — LIPASE, BLOOD: Lipase: 22 U/L (ref 11–51)

## 2019-11-23 NOTE — ED Triage Notes (Signed)
Patient complains of constipation for the last 3 months. Patient reports that he last took laxatives today at 1430 and 1530.

## 2019-11-23 NOTE — ED Provider Notes (Signed)
[facility] EMERGENCY DEPARTMENT Provider Note   CSN: [id number] Arrival date & time: 11/23/19  1824     History Chief Complaint  Patient presents with  . Constipation    Jaime [name] is a 64 y.o. male with a history as outlined below, presenting with complaint of worsening constipation over the past several months.  He denies current sx including no abdominal pain, but reports had to take 2 laxatives today before having a very large and had stool, last bm prior to today was 4-5 days ago.  He denies rectal pain, denies abdominal distention, fevers, no n/v, dysuria or back pain. He is on daily oxycodone for chronic back pain.  He also describes increased dry skin and poor appetite. He denies back pain, dysuria, fevers, chills and has no abdominal distention.  He describes low energy level. He was diagnosed with hypothyroidism on levothyroid. Unsure of last thyroid level check.    The history is provided by the patient.       Past Medical History:  Diagnosis Date  . Anxiety   . Arthritis   . BPH (benign prostatic hyperplasia)   . Chest pain   . Chronic pain    neck  . Constipation   . Genital herpes   . Hypothyroidism   . Neuropathy   . 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 Tobacco abuse     Patient Active Problem List   Diagnosis Date Noted  . Hx of adenomatous colonic polyps 04/05/2018  . 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 Scabies 06/09/2017  . Emphysema of lung ([place]) 06/09/2017  . Fever 06/08/2017  . Leukocytosis 06/08/2017  . BPH (benign prostatic hyperplasia) 06/08/2017  . Chronic pain 06/08/2017  . Chronic constipation 05/11/2012  . Chest pain, atypical 12/14/2011  . Tobacco abuse 12/14/2011    Past Surgical History:  Procedure Laterality Date  . CHOLECYSTECTOMY  1982  . chordoma  1996  . COLONOSCOPY  2001   pt reports normal at [facility]  . COLONOSCOPY  05/30/2012   Dr. Gala Romney: tubular adenomas removed.  next TCS in 5 years.   . COLONOSCOPY WITH PROPOFOL N/A 06/14/2018   Procedure: COLONOSCOPY WITH PROPOFOL;  Surgeon: Daneil Dolin, MD;  Location: [facility] ENDO SUITE;  Service: Endoscopy;  Laterality: N/A;  2:15pm-moved to 11:15 per Mindy  . MASTOIDECTOMY  1991  . NECK SURGERY  2009   fracture C3-7       Family History  Problem Relation Age of Onset  . Hypertension Mother   . Heart disease Father   . Lung cancer Father   . Heart disease Sister   . Lung cancer Brother   . Colon cancer Neg Hx     Social History   Tobacco Use  . Smoking status: Current Every Day Smoker    Packs/day: 1.50    Years: 44.00    Pack years: 66.00    Types: Cigarettes  . Smokeless tobacco: Never Used  Substance Use Topics  . Alcohol use: No    Comment: quit about 4 years ago; denied 04/05/18  . Drug use: No    Home Medications Prior to Admission medications   Medication Sig Start Date End Date Taking? Authorizing Provider  ALPRAZolam Duanne Moron) 1 MG tablet Take 1 mg by mouth 3 (three) times daily. For nerves    Yes [provider]  ANORO ELLIPTA 62.5-25 MCG/INH AEPB Inhale 1 puff into the lungs daily. 11/04/19  Yes  [provider]  BELBUCA 150 MCG FILM SMARTSIG:1 Tablet(s) By Mouth Every 12 Hours PRN 10/23/19  Yes [provider]  levothyroxine (SYNTHROID, LEVOTHROID) 25 MCG tablet Take 25 mcg by mouth daily.  03/29/18  Yes [provider]  meloxicam (MOBIC) 7.5 MG tablet Take 7.5 mg by mouth 2 (two) times daily.   Yes [provider]  mometasone (ELOCON) 0.1 % cream Apply 1 application topically daily as needed (eczema).   Yes [provider]  mupirocin cream (BACTROBAN) 2 % Apply 1 application topically daily as needed (sores). Apply to ears   Yes [provider]  nystatin (MYCOSTATIN) 100000 UNIT/ML suspension  11/21/19  Yes [provider]  Vitamin D, Ergocalciferol, (DRISDOL) 1.25 MG (50000 UNIT) CAPS capsule Take 50,000 Units by  mouth once a week. 11/18/19  Yes [provider]  acetaminophen (TYLENOL) 500 MG tablet Take 500 mg by mouth daily as needed for mild pain or moderate pain.     [provider]  donepezil (ARICEPT) 5 MG tablet Take 5 mg by mouth daily. 10/30/19   [provider]  DULoxetine (CYMBALTA) 60 MG capsule Take 60 mg by mouth daily.  03/15/18   [provider]  HYDROcodone-acetaminophen (NORCO) 10-325 MG per tablet Take 1 tablet by mouth 4 (four) times daily.    [provider]  HYDROcodone-acetaminophen (NORCO) 7.5-325 MG tablet Take 1 tablet by mouth 2 (two) times daily as needed. 10/22/19   [provider]  ibuprofen (ADVIL) 800 MG tablet Take 800 mg by mouth 3 (three) times daily as needed. 11/20/19   [provider]  PERCOCET 5-325 MG tablet Take 1 tablet by mouth every 8 (eight) hours as needed. 11/19/19   [provider]  tamsulosin (FLOMAX) 0.4 MG CAPS capsule Take 0.4 mg by mouth daily.    [provider]  tiZANidine (ZANAFLEX) 4 MG tablet Take 4 mg by mouth at bedtime. 11/23/19   [provider]    Allergies    Celebrex [celecoxib], Ciprofloxacin, Contrast media [iodinated diagnostic agents], Dilaudid [hydromorphone], Latex, Morphine and related, and Sulfa antibiotics  Review of Systems   Review of Systems  Constitutional: Positive for appetite change and fatigue. Negative for chills and fever.  HENT: Negative for congestion and sore throat.   Eyes: Negative.   Respiratory: Negative for chest tightness and shortness of breath.   Cardiovascular: Negative for chest pain.  Gastrointestinal: Positive for constipation. Negative for abdominal distention, abdominal pain, blood in stool, nausea and vomiting.  Genitourinary: Negative.   Musculoskeletal: Negative for arthralgias, joint swelling and neck pain.  Skin: Negative.  Negative for rash and wound.       Dry skin  Neurological: Negative for dizziness,  weakness, light-headedness, numbness and headaches.  Psychiatric/Behavioral: Negative.     Physical Exam Updated Vital Signs BP 133/71   Pulse 69   Temp 98.1 F (36.7 C) (Oral)   Resp 16   Ht 5\' 10"  (1.778 m)   Wt 59.4 kg   SpO2 99%   BMI 18.80 kg/m   Physical Exam Vitals and nursing note reviewed.  Constitutional:      Appearance: He is well-developed.  HENT:     Head: Normocephalic and atraumatic.  Eyes:     Conjunctiva/sclera: Conjunctivae normal.  Cardiovascular:     Rate and Rhythm: Normal rate and regular rhythm.     Heart sounds: Normal heart sounds.  Pulmonary:     Effort: Pulmonary effort is normal.  Breath sounds: Normal breath sounds. No wheezing.  Abdominal:     General: Bowel sounds are normal.     Palpations: Abdomen is soft.     Tenderness: There is no abdominal tenderness.  Musculoskeletal:        General: Normal range of motion.     Cervical back: Normal range of motion.  Skin:    General: Skin is warm and dry.     Nails: There is clubbing.     Comments: Skin is dry. No rash.  Several small bruises on arms.   Neurological:     Mental Status: He is alert.     ED Results / Procedures / Treatments   Labs (all labs ordered are listed, but only abnormal results are displayed) Labs Reviewed  CBC WITH DIFFERENTIAL/PLATELET - Abnormal; Notable for the following components:      Result Value   WBC 11.3 (*)    All other components within normal limits  COMPREHENSIVE METABOLIC PANEL - Abnormal; Notable for the following components:   Total Protein 8.3 (*)    All other components within normal limits  TSH - Abnormal; Notable for the following components:   TSH 5.419 (*)    All other components within normal limits  URINALYSIS, ROUTINE W REFLEX MICROSCOPIC - Abnormal; Notable for the following components:   Color, Urine STRAW (*)    Hgb urine dipstick SMALL (*)    All other components within normal limits  LIPASE, BLOOD     EKG None  Radiology DG Abd 2 Views  Result Date: 11/24/2019 CLINICAL DATA:  Constipation. Patient took laxatives earlier today. EXAM: ABDOMEN - 2 VIEW COMPARISON:  None. FINDINGS: Slight increased air within nondilated small bowel. No evidence of obstruction. No free air. Small volume of stool in the descending and rectosigmoid colon. No abnormal rectal distention. Calcification in the right pelvis is typical of phleboliths. No radiopaque calculi or abnormal soft tissue calcifications. Lung bases are clear. No acute osseous abnormalities. IMPRESSION: 1. Small volume of colonic stool. No evidence of obstruction or fecal impaction. 2. Slight increased air within nondilated small bowel may be related to laxatives or mild ileus. Electronically Signed   By: Keith Rake M.D.   On: 11/24/2019 00:43    Procedures Procedures (including critical care time)  Medications Ordered in ED Medications - No data to display  ED Course  I have reviewed the triage vital signs and the nursing notes.  Pertinent labs & imaging results that were available during my care of the patient were reviewed by me and considered in my medical decision making (see chart for details).    MDM Rules/Calculators/A&P                      Labs and imaging reviewed and reassuring for no emergent condition, ie no signs of intestinal obstruction/ obstipation/constipation.  Exam negative for abd pain or distention.  He does have an elevated TSH indicating probable subtherapeutic dosing of levothyroxine.  Pt advised to f/u with pcp for further management of this condition.  Advised daily colace or miralax.    The patient appears reasonably screened and/or stabilized for discharge and I doubt any other medical condition or other [facility] requiring further screening, evaluation, or treatment in the ED at this time prior to discharge.  Final Clinical Impression(s) / ED Diagnoses Final diagnoses:  Constipation, unspecified  constipation type  Hypothyroidism, unspecified type    Rx / DC Orders ED Discharge Orders  None       Landis Martins 11/24/19 1318    Nat Christen, MD 11/24/19 [phone]

## 2019-11-24 DIAGNOSIS — K5900 Constipation, unspecified: Secondary | ICD-10-CM | POA: Diagnosis not present

## 2019-11-24 LAB — TSH: TSH: 5.419 u[IU]/mL — ABNORMAL HIGH (ref 0.350–4.500)

## 2019-11-24 NOTE — Discharge Instructions (Addendum)
Your labs tonight are stable except for your thyroid test - your TSH is elevated, suggesting that your thyroid medicine probably needs to be adjusted.  Please call your primary MD who may want to check additional lab tests but may also change your medications.  This can be at least partially the source of your constipation, but your chronic pain medicine also is probably partially to blame.  I recommend a daily stool softener like colace or continue taking miralax daily to help keep your stools soft (no prescriptions needed).

## 2019-11-24 NOTE — ED Notes (Signed)
Patient transported to X-ray 

## 2019-11-26 DIAGNOSIS — K0511 Chronic gingivitis, non-plaque induced: Secondary | ICD-10-CM | POA: Diagnosis not present

## 2019-11-26 DIAGNOSIS — K056 Periodontal disease, unspecified: Secondary | ICD-10-CM | POA: Diagnosis not present

## 2019-12-17 DIAGNOSIS — M79603 Pain in arm, unspecified: Secondary | ICD-10-CM | POA: Diagnosis not present

## 2019-12-17 DIAGNOSIS — G253 Myoclonus: Secondary | ICD-10-CM | POA: Diagnosis not present

## 2019-12-17 DIAGNOSIS — G8929 Other chronic pain: Secondary | ICD-10-CM | POA: Diagnosis not present

## 2019-12-17 DIAGNOSIS — G603 Idiopathic progressive neuropathy: Secondary | ICD-10-CM | POA: Diagnosis not present

## 2019-12-31 DIAGNOSIS — R05 Cough: Secondary | ICD-10-CM | POA: Diagnosis not present

## 2019-12-31 DIAGNOSIS — R0981 Nasal congestion: Secondary | ICD-10-CM | POA: Diagnosis not present

## 2019-12-31 DIAGNOSIS — E039 Hypothyroidism, unspecified: Secondary | ICD-10-CM | POA: Diagnosis not present

## 2019-12-31 DIAGNOSIS — K0511 Chronic gingivitis, non-plaque induced: Secondary | ICD-10-CM | POA: Diagnosis not present

## 2019-12-31 DIAGNOSIS — Z0001 Encounter for general adult medical examination with abnormal findings: Secondary | ICD-10-CM | POA: Diagnosis not present

## 2020-01-02 DIAGNOSIS — Z0001 Encounter for general adult medical examination with abnormal findings: Secondary | ICD-10-CM | POA: Diagnosis not present

## 2020-01-02 DIAGNOSIS — E039 Hypothyroidism, unspecified: Secondary | ICD-10-CM | POA: Diagnosis not present

## 2020-01-02 DIAGNOSIS — F17219 Nicotine dependence, cigarettes, with unspecified nicotine-induced disorders: Secondary | ICD-10-CM | POA: Diagnosis not present

## 2020-01-02 DIAGNOSIS — G8921 Chronic pain due to trauma: Secondary | ICD-10-CM | POA: Diagnosis not present

## 2020-01-02 DIAGNOSIS — J449 Chronic obstructive pulmonary disease, unspecified: Secondary | ICD-10-CM | POA: Diagnosis not present

## 2020-01-13 DIAGNOSIS — G8929 Other chronic pain: Secondary | ICD-10-CM | POA: Diagnosis not present

## 2020-01-13 DIAGNOSIS — Z79891 Long term (current) use of opiate analgesic: Secondary | ICD-10-CM | POA: Diagnosis not present

## 2020-01-13 DIAGNOSIS — M79603 Pain in arm, unspecified: Secondary | ICD-10-CM | POA: Diagnosis not present

## 2020-01-13 DIAGNOSIS — M545 Low back pain: Secondary | ICD-10-CM | POA: Diagnosis not present

## 2020-01-13 DIAGNOSIS — M79604 Pain in right leg: Secondary | ICD-10-CM | POA: Diagnosis not present

## 2020-01-13 DIAGNOSIS — M542 Cervicalgia: Secondary | ICD-10-CM | POA: Diagnosis not present

## 2020-01-22 IMAGING — MR MR CERVICAL SPINE W/O CM
4 of 5 series · 14 of 48 positions shown · non-contrast
Comparison: None.

CLINICAL DATA: Neck pain, bilateral shoulder pain

EXAM:
MRI CERVICAL SPINE WITHOUT CONTRAST
TECHNIQUE: Multiplanar, multisequence MR imaging of the cervical spine was
performed. No intravenous contrast was administered.

[Series 3: T2 · sagittal · 3.0mm · 0.40mm/px · 5 of 15 slices shown (1 of 2)]
[im 1/15]
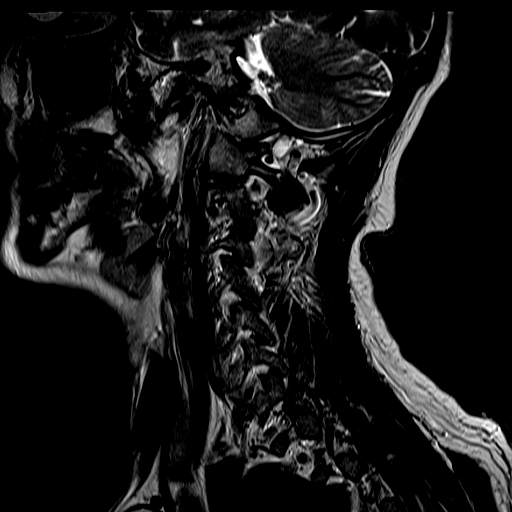
[im 3/15]
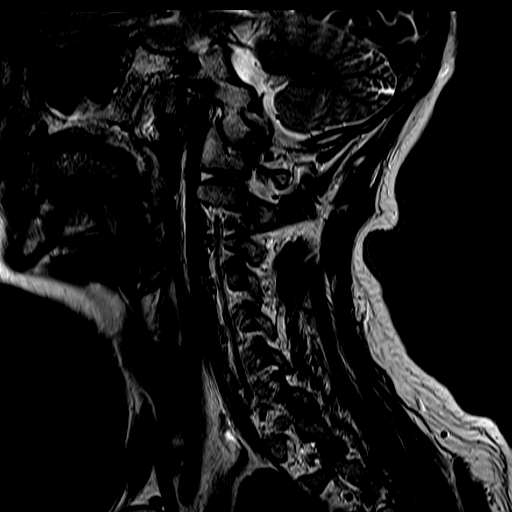
[im 5/15]
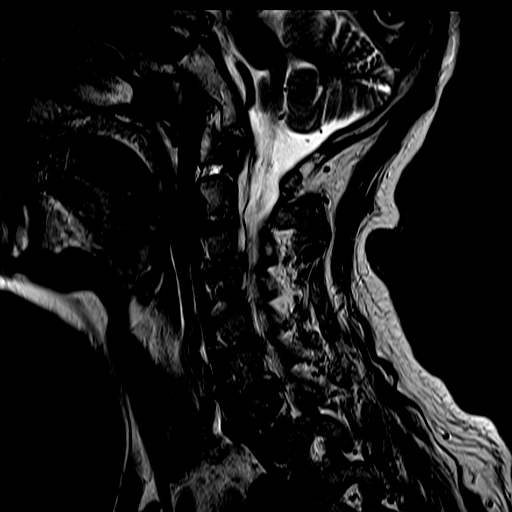
[im 8/15]
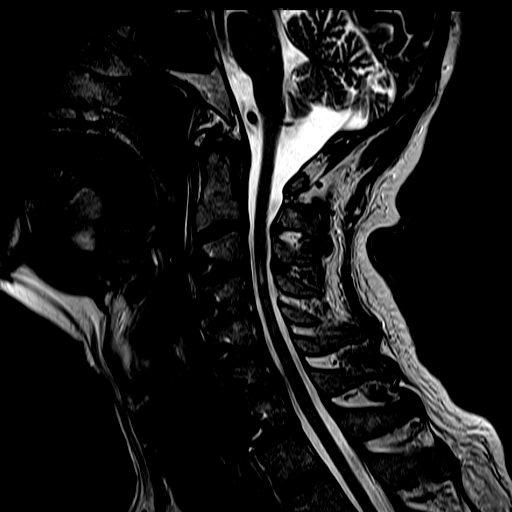
[im 12/15]
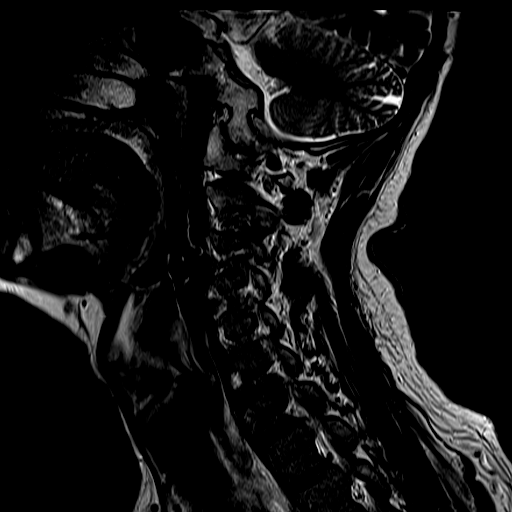

[Series 4: FLAIR · sagittal · 3.0mm · 0.46mm/px · 3 of 15 slices shown]
[im 3/15]
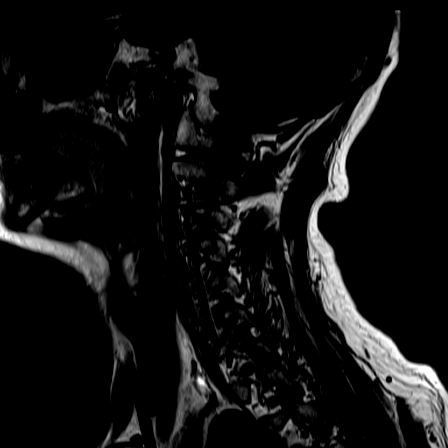
[im 8/15]
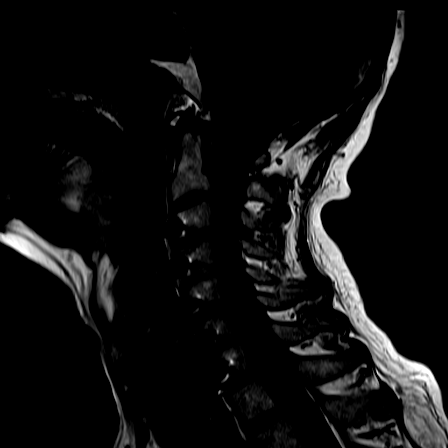
[im 12/15]
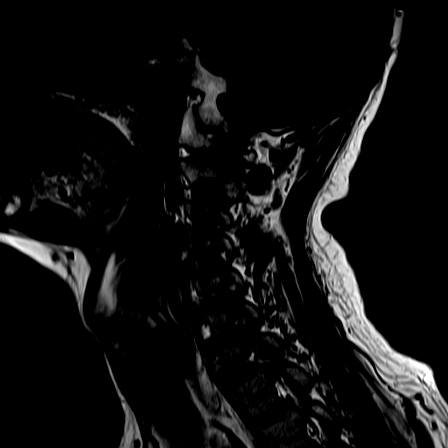

[Series 5: ir sagital · sagittal · 3.0mm · 0.23mm/px · 3 of 15 slices shown]
[im 3/15]
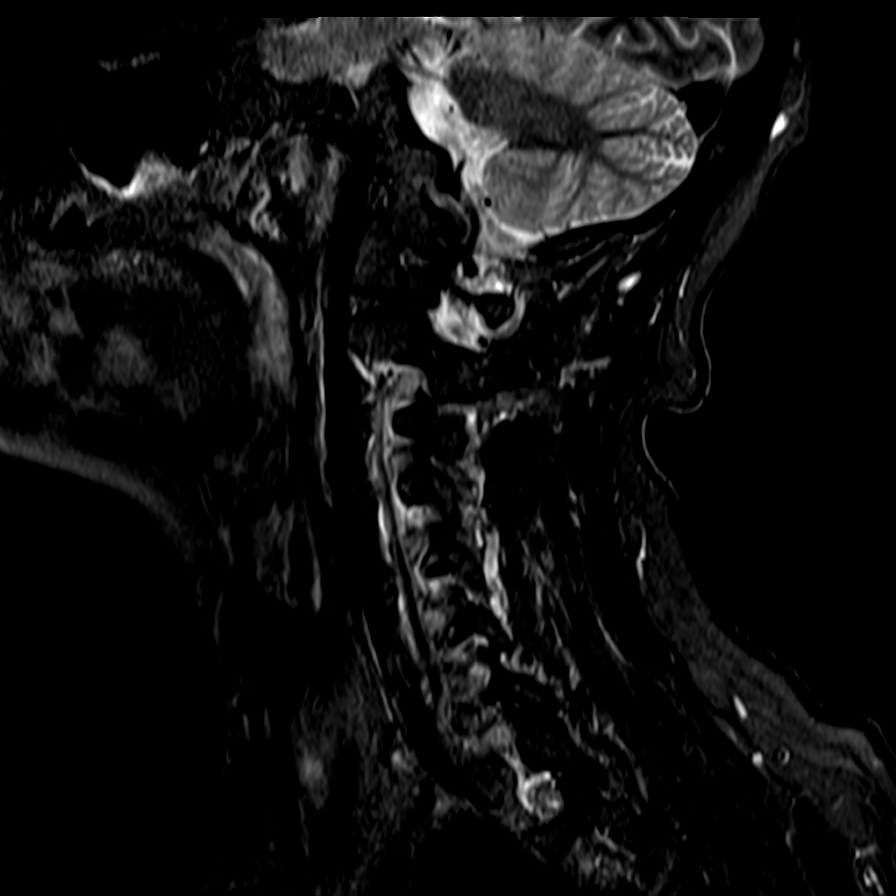
[im 9/15]
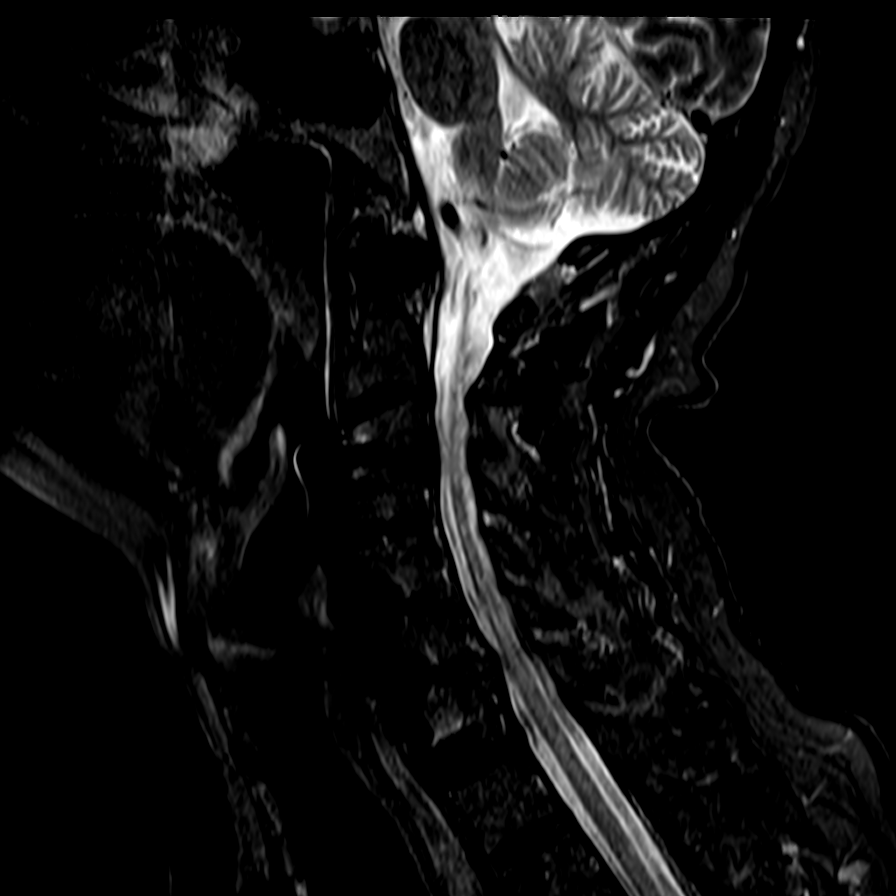
[im 15/15]
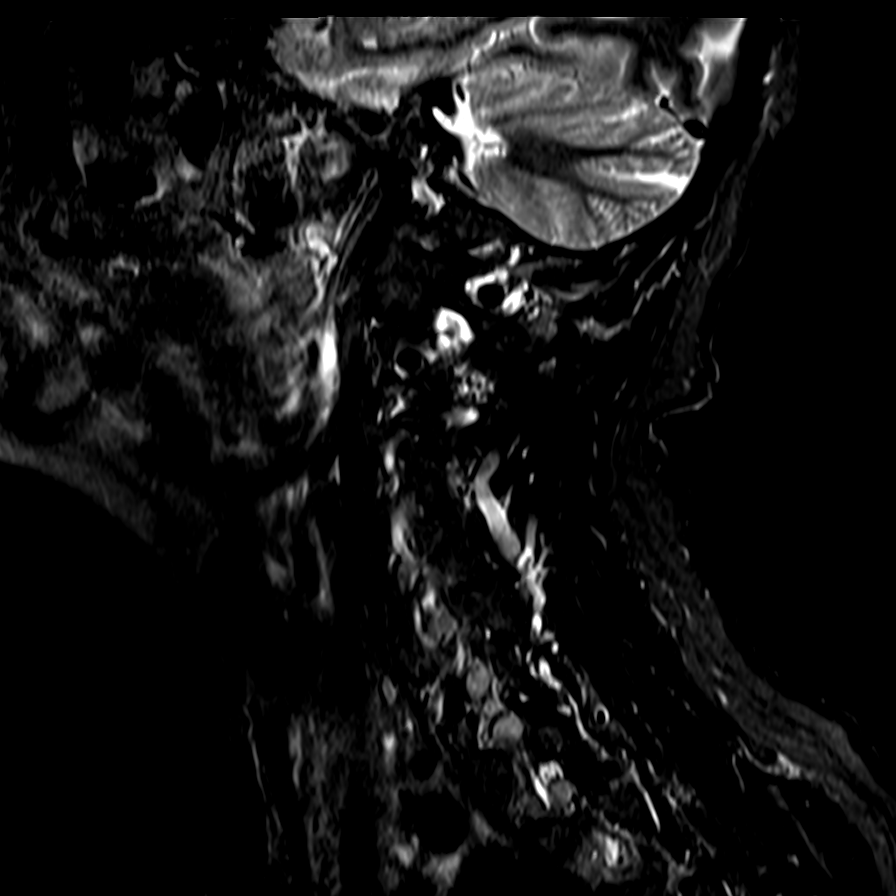

[Series 7: T2 · axial · 3.0mm · 0.24mm/px · z∈[-7,+64]mm · 3 of 34 slices shown (2 of 2)]
[im 6/34]
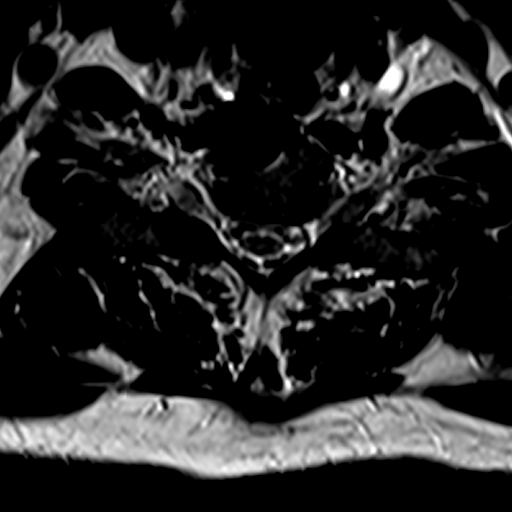
[im 18/34]
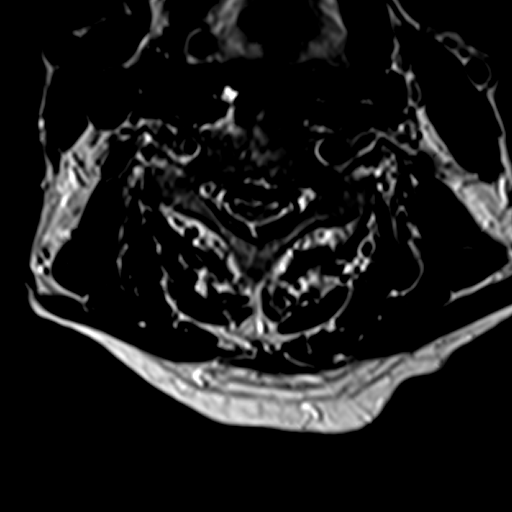
[im 28/34]
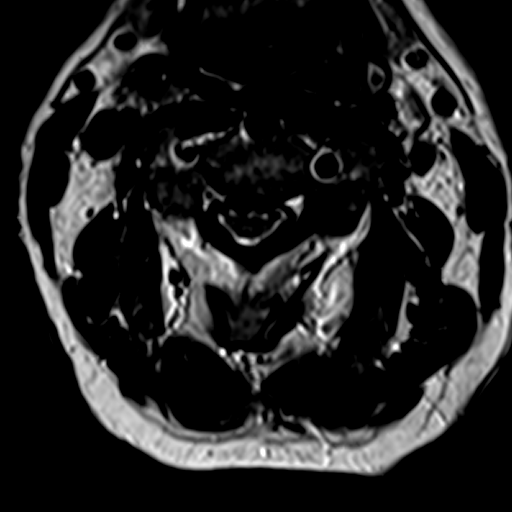

[14 of 48 positions shown; findings below may reference images not displayed]

FINDINGS: Alignment: Physiologic.

Vertebrae: No fracture, evidence of discitis, or bone lesion.

Cord: Normal signal and morphology.

Posterior Fossa, vertebral arteries, paraspinal tissues: Posterior
fossa demonstrates no focal abnormality. Vertebral artery flow voids
are maintained. Paraspinal soft tissues are unremarkable.

Disc levels:

Discs: Anterior cervical fusion from C3 through C7.

C2-3: No significant disc bulge. No neural foraminal stenosis. No
central canal stenosis.

C3-4: Interbody fusion without significant osseous bridging across
the C3-4 disc space concerning for pseudoarthrosis. No neural
foraminal stenosis. No central canal stenosis.

C4-5: Interbody fusion. No neural foraminal stenosis. No central
canal stenosis.

C5-6: Interbody fusion. No neural foraminal stenosis. No central
canal stenosis.

C6-7: Interbody fusion. No neural foraminal stenosis. No central
canal stenosis.

C7-T1: No significant disc bulge. No neural foraminal stenosis. No
central canal stenosis.
IMPRESSION: 1. At C3-4 there is interbody fusion without significant osseous
bridging across the C3-4 disc space concerning for pseudoarthrosis.

## 2020-01-27 DIAGNOSIS — K5900 Constipation, unspecified: Secondary | ICD-10-CM | POA: Diagnosis not present

## 2020-01-27 DIAGNOSIS — R0981 Nasal congestion: Secondary | ICD-10-CM | POA: Diagnosis not present

## 2020-02-04 DIAGNOSIS — R600 Localized edema: Secondary | ICD-10-CM | POA: Diagnosis not present

## 2020-02-04 DIAGNOSIS — R252 Cramp and spasm: Secondary | ICD-10-CM | POA: Diagnosis not present

## 2020-02-05 ENCOUNTER — Other Ambulatory Visit: Payer: Self-pay | Admitting: Internal Medicine

## 2020-02-05 DIAGNOSIS — R252 Cramp and spasm: Secondary | ICD-10-CM

## 2020-02-05 DIAGNOSIS — R600 Localized edema: Secondary | ICD-10-CM

## 2020-02-10 ENCOUNTER — Ambulatory Visit (HOSPITAL_COMMUNITY): Payer: Medicare Other

## 2020-02-10 DIAGNOSIS — M542 Cervicalgia: Secondary | ICD-10-CM | POA: Diagnosis not present

## 2020-02-10 DIAGNOSIS — G253 Myoclonus: Secondary | ICD-10-CM | POA: Diagnosis not present

## 2020-02-10 DIAGNOSIS — M79604 Pain in right leg: Secondary | ICD-10-CM | POA: Diagnosis not present

## 2020-02-10 DIAGNOSIS — M79601 Pain in right arm: Secondary | ICD-10-CM | POA: Diagnosis not present

## 2020-02-10 DIAGNOSIS — M545 Low back pain: Secondary | ICD-10-CM | POA: Diagnosis not present

## 2020-02-12 ENCOUNTER — Ambulatory Visit (HOSPITAL_COMMUNITY): Admission: RE | Admit: 2020-02-12 | Payer: Medicare Other | Source: Ambulatory Visit

## 2020-02-12 ENCOUNTER — Encounter (HOSPITAL_COMMUNITY): Payer: Self-pay

## 2020-02-14 ENCOUNTER — Other Ambulatory Visit: Payer: Self-pay

## 2020-02-14 ENCOUNTER — Ambulatory Visit (HOSPITAL_COMMUNITY)
Admission: RE | Admit: 2020-02-14 | Discharge: 2020-02-14 | Disposition: A | Discharge status: Home / Self Care | Payer: Medicare Other | Source: Ambulatory Visit | Attending: Internal Medicine | Admitting: Internal Medicine

## 2020-02-14 DIAGNOSIS — R252 Cramp and spasm: Secondary | ICD-10-CM | POA: Insufficient documentation

## 2020-02-14 DIAGNOSIS — R600 Localized edema: Secondary | ICD-10-CM

## 2020-03-04 DIAGNOSIS — R21 Rash and other nonspecific skin eruption: Secondary | ICD-10-CM | POA: Diagnosis not present

## 2020-03-04 DIAGNOSIS — G5792 Unspecified mononeuropathy of left lower limb: Secondary | ICD-10-CM | POA: Diagnosis not present

## 2020-03-19 DIAGNOSIS — R0981 Nasal congestion: Secondary | ICD-10-CM | POA: Diagnosis not present

## 2020-04-08 DIAGNOSIS — Z79891 Long term (current) use of opiate analgesic: Secondary | ICD-10-CM | POA: Diagnosis not present

## 2020-04-08 DIAGNOSIS — M545 Low back pain: Secondary | ICD-10-CM | POA: Diagnosis not present

## 2020-04-08 DIAGNOSIS — G253 Myoclonus: Secondary | ICD-10-CM | POA: Diagnosis not present

## 2020-04-08 DIAGNOSIS — M542 Cervicalgia: Secondary | ICD-10-CM | POA: Diagnosis not present

## 2020-04-08 DIAGNOSIS — M79604 Pain in right leg: Secondary | ICD-10-CM | POA: Diagnosis not present

## 2020-04-08 DIAGNOSIS — M79601 Pain in right arm: Secondary | ICD-10-CM | POA: Diagnosis not present

## 2020-04-10 DIAGNOSIS — E039 Hypothyroidism, unspecified: Secondary | ICD-10-CM | POA: Diagnosis not present

## 2020-04-10 DIAGNOSIS — G8921 Chronic pain due to trauma: Secondary | ICD-10-CM | POA: Diagnosis not present

## 2020-04-10 DIAGNOSIS — G894 Chronic pain syndrome: Secondary | ICD-10-CM | POA: Diagnosis not present

## 2020-04-10 DIAGNOSIS — F17219 Nicotine dependence, cigarettes, with unspecified nicotine-induced disorders: Secondary | ICD-10-CM | POA: Diagnosis not present

## 2020-04-10 DIAGNOSIS — E785 Hyperlipidemia, unspecified: Secondary | ICD-10-CM | POA: Diagnosis not present

## 2020-04-10 DIAGNOSIS — B370 Candidal stomatitis: Secondary | ICD-10-CM | POA: Diagnosis not present

## 2020-04-13 DIAGNOSIS — L03012 Cellulitis of left finger: Secondary | ICD-10-CM | POA: Diagnosis not present

## 2020-04-16 DIAGNOSIS — E039 Hypothyroidism, unspecified: Secondary | ICD-10-CM | POA: Diagnosis not present

## 2020-04-16 DIAGNOSIS — L03012 Cellulitis of left finger: Secondary | ICD-10-CM | POA: Diagnosis not present

## 2020-04-23 DIAGNOSIS — H6981 Other specified disorders of Eustachian tube, right ear: Secondary | ICD-10-CM | POA: Diagnosis not present

## 2020-04-23 DIAGNOSIS — H905 Unspecified sensorineural hearing loss: Secondary | ICD-10-CM | POA: Diagnosis not present

## 2020-04-23 DIAGNOSIS — J342 Deviated nasal septum: Secondary | ICD-10-CM | POA: Diagnosis not present

## 2020-05-21 DIAGNOSIS — L02212 Cutaneous abscess of back [any part, except buttock]: Secondary | ICD-10-CM | POA: Diagnosis not present

## 2020-05-28 DIAGNOSIS — M79645 Pain in left finger(s): Secondary | ICD-10-CM | POA: Diagnosis not present

## 2020-05-28 DIAGNOSIS — J309 Allergic rhinitis, unspecified: Secondary | ICD-10-CM | POA: Diagnosis not present

## 2020-05-28 DIAGNOSIS — L02212 Cutaneous abscess of back [any part, except buttock]: Secondary | ICD-10-CM | POA: Diagnosis not present

## 2020-05-28 DIAGNOSIS — R05 Cough: Secondary | ICD-10-CM | POA: Diagnosis not present

## 2020-05-28 DIAGNOSIS — L03012 Cellulitis of left finger: Secondary | ICD-10-CM | POA: Diagnosis not present

## 2020-05-28 DIAGNOSIS — R0981 Nasal congestion: Secondary | ICD-10-CM | POA: Diagnosis not present

## 2020-05-28 DIAGNOSIS — K0511 Chronic gingivitis, non-plaque induced: Secondary | ICD-10-CM | POA: Diagnosis not present

## 2020-06-03 DIAGNOSIS — M79601 Pain in right arm: Secondary | ICD-10-CM | POA: Diagnosis not present

## 2020-06-03 DIAGNOSIS — M545 Low back pain: Secondary | ICD-10-CM | POA: Diagnosis not present

## 2020-06-03 DIAGNOSIS — E559 Vitamin D deficiency, unspecified: Secondary | ICD-10-CM | POA: Diagnosis not present

## 2020-06-03 DIAGNOSIS — E538 Deficiency of other specified B group vitamins: Secondary | ICD-10-CM | POA: Diagnosis not present

## 2020-06-03 DIAGNOSIS — D509 Iron deficiency anemia, unspecified: Secondary | ICD-10-CM | POA: Diagnosis not present

## 2020-06-03 DIAGNOSIS — M542 Cervicalgia: Secondary | ICD-10-CM | POA: Diagnosis not present

## 2020-06-03 DIAGNOSIS — M79604 Pain in right leg: Secondary | ICD-10-CM | POA: Diagnosis not present

## 2020-06-26 DIAGNOSIS — L02212 Cutaneous abscess of back [any part, except buttock]: Secondary | ICD-10-CM | POA: Diagnosis not present

## 2020-06-26 DIAGNOSIS — L03012 Cellulitis of left finger: Secondary | ICD-10-CM | POA: Diagnosis not present

## 2020-06-26 DIAGNOSIS — M79645 Pain in left finger(s): Secondary | ICD-10-CM | POA: Diagnosis not present

## 2020-06-28 ENCOUNTER — Emergency Department (HOSPITAL_COMMUNITY)
Admission: EM | Admit: 2020-06-28 | Discharge: 2020-06-29 | Disposition: A | Discharge status: Home / Self Care | Payer: Medicare Other | Attending: Emergency Medicine | Admitting: Emergency Medicine

## 2020-06-28 ENCOUNTER — Encounter (HOSPITAL_COMMUNITY): Payer: Self-pay | Admitting: Emergency Medicine

## 2020-06-28 DIAGNOSIS — Z5321 Procedure and treatment not carried out due to patient leaving prior to being seen by health care provider: Secondary | ICD-10-CM | POA: Diagnosis not present

## 2020-06-28 DIAGNOSIS — R2233 Localized swelling, mass and lump, upper limb, bilateral: Secondary | ICD-10-CM | POA: Diagnosis not present

## 2020-06-28 LAB — CBC
HCT: 39.5 % (ref 39.0–52.0)
Hemoglobin: 12.9 g/dL — ABNORMAL LOW (ref 13.0–17.0)
MCH: 31.2 pg (ref 26.0–34.0)
MCHC: 32.7 g/dL (ref 30.0–36.0)
MCV: 95.6 fL (ref 80.0–100.0)
Platelets: 241 10*3/uL (ref 150–400)
RBC: 4.13 MIL/uL — ABNORMAL LOW (ref 4.22–5.81)
RDW: 13.7 % (ref 11.5–15.5)
WBC: 9.3 10*3/uL (ref 4.0–10.5)
nRBC: 0 % (ref 0.0–0.2)

## 2020-06-28 LAB — BASIC METABOLIC PANEL
Anion gap: 9 (ref 5–15)
BUN: 12 mg/dL (ref 8–23)
CO2: 26 mmol/L (ref 22–32)
Calcium: 9.1 mg/dL (ref 8.9–10.3)
Chloride: 100 mmol/L (ref 98–111)
Creatinine, Ser: 0.92 mg/dL (ref 0.61–1.24)
GFR calc Af Amer: >60 mL/min (ref >60)
GFR calc non Af Amer: >60 mL/min (ref >60)
Glucose, Bld: 102 mg/dL — ABNORMAL HIGH (ref 70–99)
Potassium: 4 mmol/L (ref 3.5–5.1)
Sodium: 135 mmol/L (ref 135–145)

## 2020-06-28 NOTE — ED Triage Notes (Signed)
Patient reports bilateral arm swelling onset this AM. States he believes the swelling is related to him taking Ciprofloxacin.

## 2020-06-29 NOTE — ED Notes (Signed)
Patient called x2 with no response for vitals recheck

## 2020-06-30 DIAGNOSIS — G8929 Other chronic pain: Secondary | ICD-10-CM | POA: Diagnosis not present

## 2020-06-30 DIAGNOSIS — T7840XA Allergy, unspecified, initial encounter: Secondary | ICD-10-CM | POA: Diagnosis not present

## 2020-06-30 DIAGNOSIS — G4700 Insomnia, unspecified: Secondary | ICD-10-CM | POA: Diagnosis not present

## 2020-06-30 DIAGNOSIS — M542 Cervicalgia: Secondary | ICD-10-CM | POA: Diagnosis not present

## 2020-06-30 DIAGNOSIS — F17210 Nicotine dependence, cigarettes, uncomplicated: Secondary | ICD-10-CM | POA: Diagnosis not present

## 2020-06-30 DIAGNOSIS — M545 Low back pain: Secondary | ICD-10-CM | POA: Diagnosis not present

## 2020-07-07 DIAGNOSIS — M542 Cervicalgia: Secondary | ICD-10-CM | POA: Diagnosis not present

## 2020-07-07 DIAGNOSIS — M545 Low back pain: Secondary | ICD-10-CM | POA: Diagnosis not present

## 2020-07-07 DIAGNOSIS — M546 Pain in thoracic spine: Secondary | ICD-10-CM | POA: Diagnosis not present

## 2020-07-07 DIAGNOSIS — Z79899 Other long term (current) drug therapy: Secondary | ICD-10-CM | POA: Diagnosis not present

## 2020-07-07 DIAGNOSIS — G8929 Other chronic pain: Secondary | ICD-10-CM | POA: Diagnosis not present

## 2020-07-07 DIAGNOSIS — F17210 Nicotine dependence, cigarettes, uncomplicated: Secondary | ICD-10-CM | POA: Diagnosis not present

## 2020-07-09 DIAGNOSIS — R030 Elevated blood-pressure reading, without diagnosis of hypertension: Secondary | ICD-10-CM | POA: Diagnosis not present

## 2020-07-09 DIAGNOSIS — T7840XD Allergy, unspecified, subsequent encounter: Secondary | ICD-10-CM | POA: Diagnosis not present

## 2020-07-09 DIAGNOSIS — F17210 Nicotine dependence, cigarettes, uncomplicated: Secondary | ICD-10-CM | POA: Diagnosis not present

## 2020-07-21 DIAGNOSIS — J309 Allergic rhinitis, unspecified: Secondary | ICD-10-CM | POA: Diagnosis not present

## 2020-07-21 DIAGNOSIS — K0511 Chronic gingivitis, non-plaque induced: Secondary | ICD-10-CM | POA: Diagnosis not present

## 2020-07-21 DIAGNOSIS — E039 Hypothyroidism, unspecified: Secondary | ICD-10-CM | POA: Diagnosis not present

## 2020-07-21 DIAGNOSIS — R0981 Nasal congestion: Secondary | ICD-10-CM | POA: Diagnosis not present

## 2020-07-21 DIAGNOSIS — L02212 Cutaneous abscess of back [any part, except buttock]: Secondary | ICD-10-CM | POA: Diagnosis not present

## 2020-07-21 DIAGNOSIS — R05 Cough: Secondary | ICD-10-CM | POA: Diagnosis not present

## 2020-07-22 DIAGNOSIS — S61002D Unspecified open wound of left thumb without damage to nail, subsequent encounter: Secondary | ICD-10-CM | POA: Diagnosis not present

## 2020-07-30 DIAGNOSIS — R5383 Other fatigue: Secondary | ICD-10-CM | POA: Diagnosis not present

## 2020-07-30 DIAGNOSIS — E559 Vitamin D deficiency, unspecified: Secondary | ICD-10-CM | POA: Diagnosis not present

## 2020-07-30 DIAGNOSIS — Z79899 Other long term (current) drug therapy: Secondary | ICD-10-CM | POA: Diagnosis not present

## 2020-07-30 DIAGNOSIS — G4700 Insomnia, unspecified: Secondary | ICD-10-CM | POA: Diagnosis not present

## 2020-08-05 DIAGNOSIS — M542 Cervicalgia: Secondary | ICD-10-CM | POA: Diagnosis not present

## 2020-08-05 DIAGNOSIS — G8929 Other chronic pain: Secondary | ICD-10-CM | POA: Diagnosis not present

## 2020-08-05 DIAGNOSIS — G4700 Insomnia, unspecified: Secondary | ICD-10-CM | POA: Diagnosis not present

## 2020-08-05 DIAGNOSIS — Z79899 Other long term (current) drug therapy: Secondary | ICD-10-CM | POA: Diagnosis not present

## 2020-08-11 DIAGNOSIS — D649 Anemia, unspecified: Secondary | ICD-10-CM | POA: Diagnosis not present

## 2020-08-11 DIAGNOSIS — D72829 Elevated white blood cell count, unspecified: Secondary | ICD-10-CM | POA: Diagnosis not present

## 2020-08-11 DIAGNOSIS — G894 Chronic pain syndrome: Secondary | ICD-10-CM | POA: Diagnosis not present

## 2020-08-11 DIAGNOSIS — E039 Hypothyroidism, unspecified: Secondary | ICD-10-CM | POA: Diagnosis not present

## 2020-08-11 DIAGNOSIS — L03012 Cellulitis of left finger: Secondary | ICD-10-CM | POA: Diagnosis not present

## 2020-08-20 DIAGNOSIS — J302 Other seasonal allergic rhinitis: Secondary | ICD-10-CM | POA: Diagnosis not present

## 2020-08-20 DIAGNOSIS — D849 Immunodeficiency, unspecified: Secondary | ICD-10-CM | POA: Diagnosis not present

## 2020-08-20 DIAGNOSIS — J3089 Other allergic rhinitis: Secondary | ICD-10-CM | POA: Diagnosis not present

## 2020-08-20 DIAGNOSIS — Z122 Encounter for screening for malignant neoplasm of respiratory organs: Secondary | ICD-10-CM | POA: Diagnosis not present

## 2020-08-20 DIAGNOSIS — L03012 Cellulitis of left finger: Secondary | ICD-10-CM | POA: Diagnosis not present

## 2020-08-26 DIAGNOSIS — J302 Other seasonal allergic rhinitis: Secondary | ICD-10-CM | POA: Diagnosis not present

## 2020-08-26 DIAGNOSIS — J3089 Other allergic rhinitis: Secondary | ICD-10-CM | POA: Diagnosis not present

## 2020-08-26 DIAGNOSIS — L03012 Cellulitis of left finger: Secondary | ICD-10-CM | POA: Diagnosis not present

## 2020-08-26 DIAGNOSIS — D849 Immunodeficiency, unspecified: Secondary | ICD-10-CM | POA: Diagnosis not present

## 2020-08-28 DIAGNOSIS — L03012 Cellulitis of left finger: Secondary | ICD-10-CM | POA: Diagnosis not present

## 2020-08-28 DIAGNOSIS — M868X4 Other osteomyelitis, hand: Secondary | ICD-10-CM | POA: Diagnosis not present

## 2020-09-03 DIAGNOSIS — D849 Immunodeficiency, unspecified: Secondary | ICD-10-CM | POA: Diagnosis not present

## 2020-09-03 DIAGNOSIS — L03012 Cellulitis of left finger: Secondary | ICD-10-CM | POA: Diagnosis not present

## 2020-09-03 DIAGNOSIS — J302 Other seasonal allergic rhinitis: Secondary | ICD-10-CM | POA: Diagnosis not present

## 2020-09-03 DIAGNOSIS — J3089 Other allergic rhinitis: Secondary | ICD-10-CM | POA: Diagnosis not present

## 2020-09-04 DIAGNOSIS — M542 Cervicalgia: Secondary | ICD-10-CM | POA: Diagnosis not present

## 2020-09-04 DIAGNOSIS — G8929 Other chronic pain: Secondary | ICD-10-CM | POA: Diagnosis not present

## 2020-09-04 DIAGNOSIS — G4700 Insomnia, unspecified: Secondary | ICD-10-CM | POA: Diagnosis not present

## 2020-09-04 DIAGNOSIS — Z79899 Other long term (current) drug therapy: Secondary | ICD-10-CM | POA: Diagnosis not present

## 2020-09-07 ENCOUNTER — Other Ambulatory Visit: Payer: Self-pay

## 2020-09-07 DIAGNOSIS — M868X4 Other osteomyelitis, hand: Secondary | ICD-10-CM | POA: Diagnosis not present

## 2020-09-07 DIAGNOSIS — M86642 Other chronic osteomyelitis, left hand: Secondary | ICD-10-CM | POA: Diagnosis not present

## 2020-09-07 DIAGNOSIS — M86142 Other acute osteomyelitis, left hand: Secondary | ICD-10-CM | POA: Diagnosis not present

## 2020-09-10 DIAGNOSIS — F17210 Nicotine dependence, cigarettes, uncomplicated: Secondary | ICD-10-CM | POA: Diagnosis not present

## 2020-09-10 DIAGNOSIS — G4700 Insomnia, unspecified: Secondary | ICD-10-CM | POA: Diagnosis not present

## 2020-09-14 ENCOUNTER — Other Ambulatory Visit (HOSPITAL_COMMUNITY): Payer: Self-pay | Admitting: Allergy and Immunology

## 2020-09-14 DIAGNOSIS — E291 Testicular hypofunction: Secondary | ICD-10-CM

## 2020-09-14 DIAGNOSIS — Z20822 Contact with and (suspected) exposure to covid-19: Secondary | ICD-10-CM | POA: Diagnosis not present

## 2020-09-14 DIAGNOSIS — J329 Chronic sinusitis, unspecified: Secondary | ICD-10-CM | POA: Diagnosis not present

## 2020-09-14 DIAGNOSIS — R059 Cough, unspecified: Secondary | ICD-10-CM | POA: Diagnosis not present

## 2020-09-15 DIAGNOSIS — M868X4 Other osteomyelitis, hand: Secondary | ICD-10-CM | POA: Diagnosis not present

## 2020-09-15 DIAGNOSIS — M79645 Pain in left finger(s): Secondary | ICD-10-CM | POA: Diagnosis not present

## 2020-09-23 ENCOUNTER — Ambulatory Visit (HOSPITAL_COMMUNITY)
Admission: RE | Admit: 2020-09-23 | Discharge: 2020-09-23 | Disposition: A | Discharge status: Home / Self Care | Payer: Medicare Other | Source: Ambulatory Visit | Attending: Allergy and Immunology | Admitting: Allergy and Immunology

## 2020-09-23 ENCOUNTER — Other Ambulatory Visit: Payer: Self-pay

## 2020-09-23 DIAGNOSIS — M810 Age-related osteoporosis without current pathological fracture: Secondary | ICD-10-CM | POA: Insufficient documentation

## 2020-09-23 DIAGNOSIS — E559 Vitamin D deficiency, unspecified: Secondary | ICD-10-CM | POA: Diagnosis not present

## 2020-09-23 DIAGNOSIS — Z13820 Encounter for screening for osteoporosis: Secondary | ICD-10-CM | POA: Diagnosis not present

## 2020-09-23 DIAGNOSIS — E291 Testicular hypofunction: Secondary | ICD-10-CM | POA: Insufficient documentation

## 2020-09-30 DIAGNOSIS — M79645 Pain in left finger(s): Secondary | ICD-10-CM | POA: Diagnosis not present

## 2020-09-30 DIAGNOSIS — M868X4 Other osteomyelitis, hand: Secondary | ICD-10-CM | POA: Diagnosis not present

## 2020-10-05 DIAGNOSIS — Z89012 Acquired absence of left thumb: Secondary | ICD-10-CM | POA: Diagnosis not present

## 2020-10-05 DIAGNOSIS — M542 Cervicalgia: Secondary | ICD-10-CM | POA: Diagnosis not present

## 2020-10-05 DIAGNOSIS — G8929 Other chronic pain: Secondary | ICD-10-CM | POA: Diagnosis not present

## 2020-10-05 DIAGNOSIS — Z79899 Other long term (current) drug therapy: Secondary | ICD-10-CM | POA: Diagnosis not present

## 2020-10-05 DIAGNOSIS — L309 Dermatitis, unspecified: Secondary | ICD-10-CM | POA: Diagnosis not present

## 2020-11-04 DIAGNOSIS — G8929 Other chronic pain: Secondary | ICD-10-CM | POA: Diagnosis not present

## 2020-11-04 DIAGNOSIS — Z79899 Other long term (current) drug therapy: Secondary | ICD-10-CM | POA: Diagnosis not present

## 2020-11-04 DIAGNOSIS — M542 Cervicalgia: Secondary | ICD-10-CM | POA: Diagnosis not present

## 2020-11-04 DIAGNOSIS — Z0000 Encounter for general adult medical examination without abnormal findings: Secondary | ICD-10-CM | POA: Diagnosis not present

## 2020-12-03 DIAGNOSIS — M542 Cervicalgia: Secondary | ICD-10-CM | POA: Diagnosis not present

## 2020-12-03 DIAGNOSIS — F17210 Nicotine dependence, cigarettes, uncomplicated: Secondary | ICD-10-CM | POA: Diagnosis not present

## 2020-12-03 DIAGNOSIS — G8929 Other chronic pain: Secondary | ICD-10-CM | POA: Diagnosis not present

## 2020-12-03 DIAGNOSIS — Z79899 Other long term (current) drug therapy: Secondary | ICD-10-CM | POA: Diagnosis not present

## 2020-12-17 DIAGNOSIS — M868X4 Other osteomyelitis, hand: Secondary | ICD-10-CM | POA: Diagnosis not present

## 2020-12-31 DIAGNOSIS — F17210 Nicotine dependence, cigarettes, uncomplicated: Secondary | ICD-10-CM | POA: Diagnosis not present

## 2020-12-31 DIAGNOSIS — M542 Cervicalgia: Secondary | ICD-10-CM | POA: Diagnosis not present

## 2020-12-31 DIAGNOSIS — G8929 Other chronic pain: Secondary | ICD-10-CM | POA: Diagnosis not present

## 2020-12-31 DIAGNOSIS — Z79899 Other long term (current) drug therapy: Secondary | ICD-10-CM | POA: Diagnosis not present

## 2021-01-28 DIAGNOSIS — Z79899 Other long term (current) drug therapy: Secondary | ICD-10-CM | POA: Diagnosis not present

## 2021-01-28 DIAGNOSIS — M542 Cervicalgia: Secondary | ICD-10-CM | POA: Diagnosis not present

## 2021-01-28 DIAGNOSIS — G8929 Other chronic pain: Secondary | ICD-10-CM | POA: Diagnosis not present

## 2021-01-28 DIAGNOSIS — F17210 Nicotine dependence, cigarettes, uncomplicated: Secondary | ICD-10-CM | POA: Diagnosis not present

## 2021-03-01 DIAGNOSIS — G8929 Other chronic pain: Secondary | ICD-10-CM | POA: Diagnosis not present

## 2021-03-01 DIAGNOSIS — Z79899 Other long term (current) drug therapy: Secondary | ICD-10-CM | POA: Diagnosis not present

## 2021-03-01 DIAGNOSIS — F17210 Nicotine dependence, cigarettes, uncomplicated: Secondary | ICD-10-CM | POA: Diagnosis not present

## 2021-03-01 DIAGNOSIS — M542 Cervicalgia: Secondary | ICD-10-CM | POA: Diagnosis not present

## 2021-04-08 ENCOUNTER — Other Ambulatory Visit: Payer: Self-pay

## 2021-04-08 ENCOUNTER — Ambulatory Visit (HOSPITAL_COMMUNITY)
Admission: RE | Admit: 2021-04-08 | Discharge: 2021-04-08 | Disposition: A | Discharge status: Home / Self Care | Payer: Medicare HMO | Source: Ambulatory Visit | Attending: Adult Health | Admitting: Adult Health

## 2021-04-08 DIAGNOSIS — R002 Palpitations: Secondary | ICD-10-CM | POA: Insufficient documentation

## 2021-09-19 IMAGING — DX DG ABDOMEN 2V
2 series · 2 of 2 positions shown · non-contrast
Comparison: None.

CLINICAL DATA: Constipation. Patient took laxatives earlier today.

EXAM:
ABDOMEN - 2 VIEW

[abdomen erect]
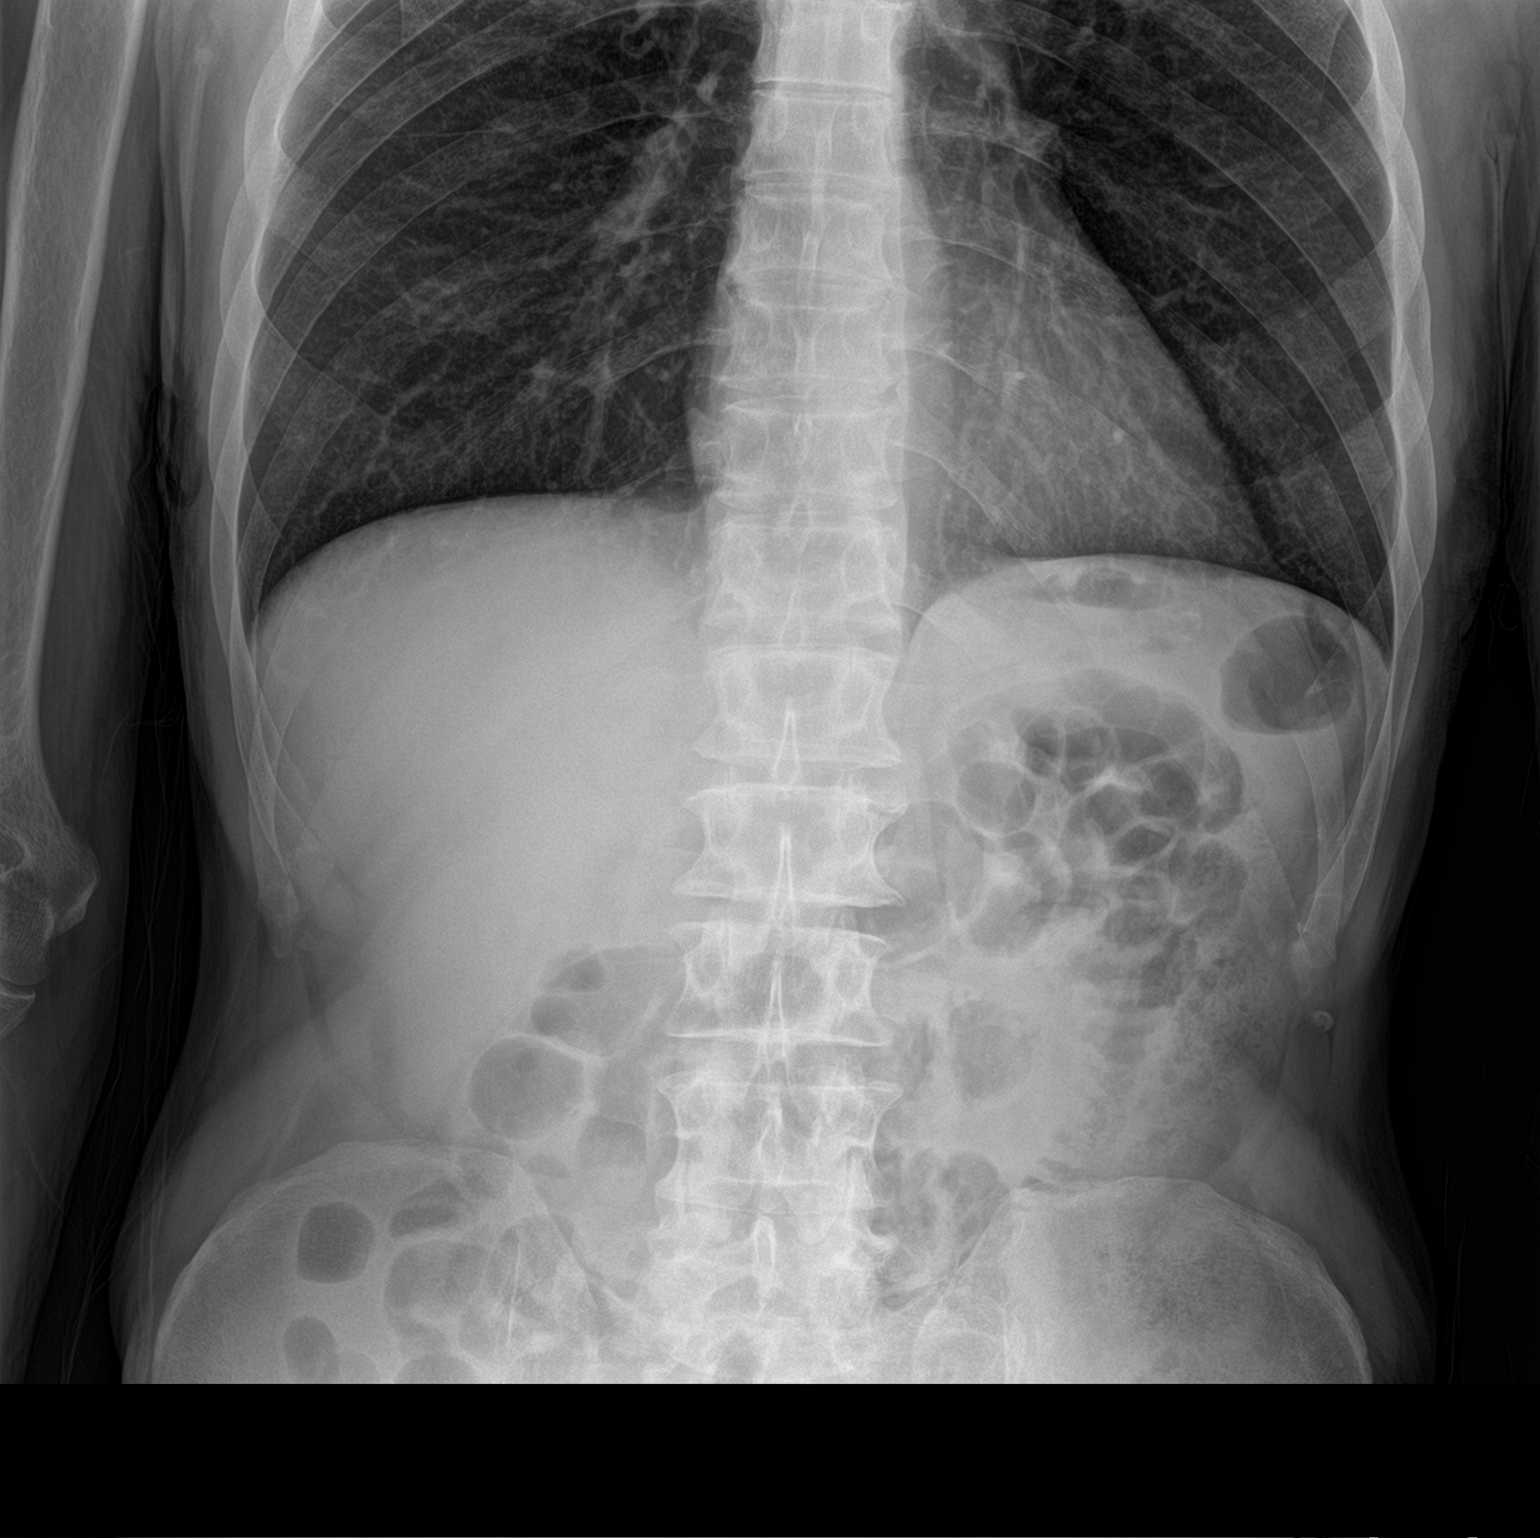

[abdomen supine]
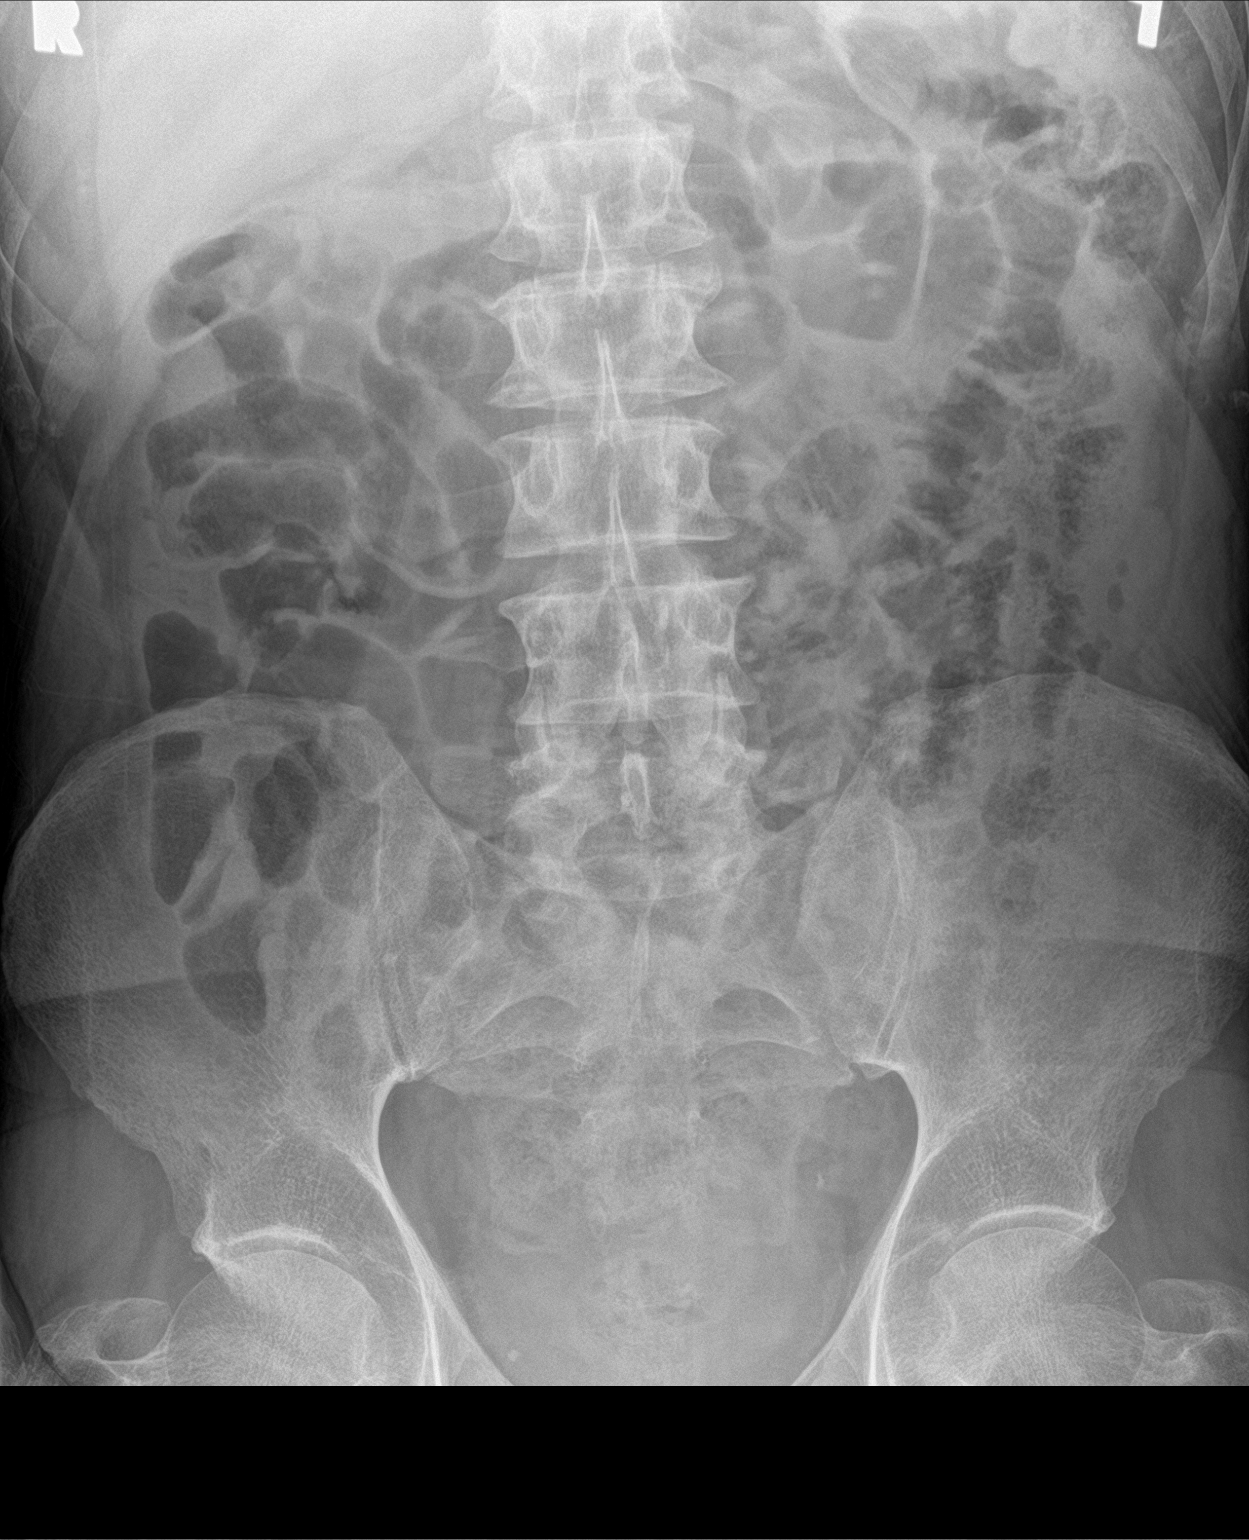

[2 of 2 positions shown; findings below may reference images not displayed]

FINDINGS: Slight increased air within nondilated small bowel. No evidence of
obstruction. No free air. Small volume of stool in the descending
and rectosigmoid colon. No abnormal rectal distention. Calcification
in the right pelvis is typical of phleboliths. No radiopaque calculi
or abnormal soft tissue calcifications. Lung bases are clear. No
acute osseous abnormalities.
IMPRESSION: 1. Small volume of colonic stool. No evidence of obstruction or
fecal impaction.
2. Slight increased air within nondilated small bowel may be related
to laxatives or mild ileus.

## 2023-01-31 ENCOUNTER — Other Ambulatory Visit (HOSPITAL_COMMUNITY): Payer: Self-pay | Admitting: Internal Medicine

## 2023-01-31 DIAGNOSIS — F17210 Nicotine dependence, cigarettes, uncomplicated: Secondary | ICD-10-CM

## 2023-01-31 DIAGNOSIS — Z122 Encounter for screening for malignant neoplasm of respiratory organs: Secondary | ICD-10-CM

## 2023-03-20 ENCOUNTER — Ambulatory Visit (HOSPITAL_COMMUNITY)
Admission: RE | Admit: 2023-03-20 | Discharge: 2023-03-20 | Disposition: A | Discharge status: Home / Self Care | Payer: 59 | Source: Ambulatory Visit | Attending: Internal Medicine | Admitting: Internal Medicine

## 2023-03-20 DIAGNOSIS — F17210 Nicotine dependence, cigarettes, uncomplicated: Secondary | ICD-10-CM

## 2023-03-20 DIAGNOSIS — Z122 Encounter for screening for malignant neoplasm of respiratory organs: Secondary | ICD-10-CM | POA: Diagnosis present

## 2023-04-13 ENCOUNTER — Telehealth: Payer: Self-pay | Admitting: Acute Care

## 2023-04-13 DIAGNOSIS — Z87891 Personal history of nicotine dependence: Secondary | ICD-10-CM

## 2023-04-13 DIAGNOSIS — R911 Solitary pulmonary nodule: Secondary | ICD-10-CM

## 2023-04-13 DIAGNOSIS — Z122 Encounter for screening for malignant neoplasm of respiratory organs: Secondary | ICD-10-CM

## 2023-04-13 NOTE — Telephone Encounter (Signed)
I have called [name] regarding a low dose CT Chest this patient had done 03/20/2023. The scan was ordered by Chrys Racer DNP, who is no longer working at the [place] office. I called to make sure the patient has had the results of the scan called and also that follow up imaging has been ordered.  The staff at [facility] were able to tell me that the patient had been seen by a Dr. Mikael Spray 5/17, and has a follow up with the same doctor 6/17. She did not know if the scan results had been shared with the patient .She also was unable to tell me if the patient had been sick.  Dr, Delton Coombes had received notification of the abnormal screening scan from [facility] as Pulmonary over seeing the program. Dr. Delton Coombes recommended a 3 month follow up and then follow up with Pulmonary.  Angelique Blonder, please place order for 3 month follow up scan due 07/2023 and schedule a 3 month follow up with Dr. Delton Coombes, Icard or myself.  I do not see Toma Copier in the care everywhere options.  I then tried to call the patient and explain this to him, see if he has been sick and if so is he being treated.  There was no answer. No option to leave a message.  Ladies, can we keep trying to get in touch with him?  Thanks so much

## 2023-04-14 NOTE — Telephone Encounter (Signed)
ATC pt. Line rang until it went to busy signal. Unable to leave VM. Patient has a DPR on file from 2019 with pt's son listed. Will wait to try and contact son until another attempt has been made to reach patient.

## 2023-04-18 ENCOUNTER — Encounter: Payer: Self-pay | Admitting: Emergency Medicine

## 2023-04-18 NOTE — Telephone Encounter (Signed)
I also called [name] to relay to the patient to call our office. Patient has an appt on 6/17 with Dr. Malen Gauze and receptionist at [facility] said they would inform the pt to call our office.

## 2023-04-18 NOTE — Telephone Encounter (Signed)
ATC pt. Line rings then goes to busy signal. Letter has been printed and mailed to patient to call our office.   Will forward to Sarah as Lorain Childes.

## 2023-05-05 NOTE — Telephone Encounter (Signed)
Called and spoke to pt. Informed him of the information per Kandice Robinsons NP. Patient aware of the CT results and is agreeable to 3 month follow up scan and visit with RB/BI/SG. Order has been placed for follow up scan.    PCCs please advise when CT chest has been scheduled. Patient needs OV with Dr. Tonia Brooms, Dr. Delton Coombes or Sarah to review results.

## 2023-05-08 NOTE — Telephone Encounter (Signed)
CT has been scheduled at [facility] for 8/19 at 6:00pm.  Pt will need to check in thru ED at 5:45.  Will route back to Johnson so she can give appt info to pt when she gives him ov appt info.

## 2023-05-10 NOTE — Telephone Encounter (Signed)
Called and left VM for pt.   Will need to give pt the CT appt info and make OV with RB/BI/SG after CT to review results.

## 2023-05-12 NOTE — Telephone Encounter (Signed)
PT ret call. Called on the after hours line to find out why we were calling. Please try again.

## 2023-05-19 ENCOUNTER — Encounter: Payer: Self-pay | Admitting: *Deleted

## 2023-06-12 NOTE — Progress Notes (Unsigned)
GI Office Note    Referring Provider: Theodosia Paling* Primary Care Physician:  [name], PA-C  Primary Gastroenterologist:Michael Jena Gauss, MD   Chief Complaint   No chief complaint on file.    History of Present Illness   Jaime [name] is a 67 y.o. male presenting today   Personal history of colon polyps.  Colonoscopy 06/2018: -normal      Medications   Current Outpatient Medications  Medication Sig Dispense Refill   acetaminophen (TYLENOL) 500 MG tablet Take 500 mg by mouth daily as needed for mild pain or moderate pain.      ALPRAZolam (XANAX) 1 MG tablet Take 1 mg by mouth 3 (three) times daily. For nerves      ANORO ELLIPTA 62.5-25 MCG/INH AEPB Inhale 1 puff into the lungs daily.     BELBUCA 150 MCG FILM SMARTSIG:1 Tablet(s) By Mouth Every 12 Hours PRN     donepezil (ARICEPT) 5 MG tablet Take 5 mg by mouth daily.     DULoxetine (CYMBALTA) 60 MG capsule Take 60 mg by mouth daily.      HYDROcodone-acetaminophen (NORCO) 10-325 MG per tablet Take 1 tablet by mouth 4 (four) times daily.     HYDROcodone-acetaminophen (NORCO) 7.5-325 MG tablet Take 1 tablet by mouth 2 (two) times daily as needed.     ibuprofen (ADVIL) 800 MG tablet Take 800 mg by mouth 3 (three) times daily as needed.     levothyroxine (SYNTHROID, LEVOTHROID) 25 MCG tablet Take 25 mcg by mouth daily.      meloxicam (MOBIC) 7.5 MG tablet Take 7.5 mg by mouth 2 (two) times daily.     mometasone (ELOCON) 0.1 % cream Apply 1 application topically daily as needed (eczema).     mupirocin cream (BACTROBAN) 2 % Apply 1 application topically daily as needed (sores). Apply to ears     nystatin (MYCOSTATIN) 100000 UNIT/ML suspension      PERCOCET 5-325 MG tablet Take 1 tablet by mouth every 8 (eight) hours as needed.     tamsulosin (FLOMAX) 0.4 MG CAPS capsule Take 0.4 mg by mouth daily.     tiZANidine (ZANAFLEX) 4 MG tablet Take 4 mg by mouth at bedtime.     Vitamin D, Ergocalciferol,  (DRISDOL) 1.25 MG (50000 UNIT) CAPS capsule Take 50,000 Units by mouth once a week.     No current facility-administered medications for this visit.    Allergies   Allergies as of 06/13/2023 - Review Complete 06/28/2020  Allergen Reaction Noted   Celebrex [celecoxib] Itching 10/28/2011   Ciprofloxacin Hives 12/14/2011   Contrast media [iodinated contrast media]  05/21/2012   Dilaudid [hydromorphone] Other (See Comments) 05/30/2012   Latex Hives 11/28/2011   Morphine and codeine  12/14/2011   Sulfa antibiotics Other (See Comments) 10/28/2011    Past Medical History   Past Medical History:  Diagnosis Date   Anxiety    Arthritis    BPH (benign prostatic hyperplasia)    Chest pain    Chronic pain    neck   Constipation    Genital herpes    Hypothyroidism    Neuropathy    Paralysis, unspecified    Upper extremity weakness s/p severe neck injury   Tobacco abuse     Past Surgical History   Past Surgical History:  Procedure Laterality Date   CHOLECYSTECTOMY  1982   chordoma  1996   COLONOSCOPY  2001   pt reports normal at [facility]  COLONOSCOPY  05/30/2012   Dr. Jena Gauss: tubular adenomas removed. next TCS in 5 years.    COLONOSCOPY WITH PROPOFOL N/A 06/14/2018   Procedure: COLONOSCOPY WITH PROPOFOL;  Surgeon: Corbin Ade, MD;  Location: [facility];  Service: Endoscopy;  Laterality: N/A;  2:15pm-moved to 11:15 per Mindy   MASTOIDECTOMY  1991   NECK SURGERY  2009   fracture C3-7    Past Family History   Family History  Problem Relation Age of Onset   Hypertension Mother    Heart disease Father    Lung cancer Father    Heart disease Sister    Lung cancer Brother    Colon cancer Neg Hx     Past Social History   Social History   Socioeconomic History   Marital status: Divorced    Spouse name: Not on file   Number of children: 4   Years of education: Not on file   Highest education level: Not on file  Occupational History   Occupation: disabled  Tobacco  Use   Smoking status: Every Day    Current packs/day: 1.50    Average packs/day: 1.5 packs/day for 44.0 years (66.0 ttl pk-yrs)    Types: Cigarettes   Smokeless tobacco: Never  Vaping Use   Vaping status: Not on file  Substance and Sexual Activity   Alcohol use: No    Comment: quit about 4 years ago; denied 04/05/18   Drug use: No   Sexual activity: Not Currently    Birth control/protection: None  Other Topics Concern   Not on file  Social History Narrative   Lives w/ significant other   Social Determinants of Health   Financial Resource Strain: Not on file  Food Insecurity: Not on file  Transportation Needs: Not on file  Physical Activity: Not on file  Stress: Not on file  Social Connections: Not on file  Intimate Partner Violence: Not on file    Review of Systems   General: Negative for anorexia, weight loss, fever, chills, fatigue, weakness. Eyes: Negative for vision changes.  ENT: Negative for hoarseness, difficulty swallowing , nasal congestion. CV: Negative for chest pain, angina, palpitations, dyspnea on exertion, peripheral edema.  Respiratory: Negative for dyspnea at rest, dyspnea on exertion, cough, sputum, wheezing.  GI: See history of present illness. GU:  Negative for dysuria, hematuria, urinary incontinence, urinary frequency, nocturnal urination.  MS: Negative for joint pain, low back pain.  Derm: Negative for rash or itching.  Neuro: Negative for weakness, abnormal sensation, seizure, frequent headaches, memory loss,  confusion.  Psych: Negative for anxiety, depression, suicidal ideation, hallucinations.  Endo: Negative for unusual weight change.  Heme: Negative for bruising or bleeding. Allergy: Negative for rash or hives.  Physical Exam   There were no vitals taken for this visit.   General: Well-nourished, well-developed in no acute distress.  Head: Normocephalic, atraumatic.   Eyes: Conjunctiva pink, no icterus. Mouth: Oropharyngeal mucosa  moist and pink , no lesions erythema or exudate. Neck: Supple without thyromegaly, masses, or lymphadenopathy.  Lungs: Clear to auscultation bilaterally.  Heart: Regular rate and rhythm, no murmurs rubs or gallops.  Abdomen: Bowel sounds are normal, nontender, nondistended, no hepatosplenomegaly or masses,  no abdominal bruits or hernia, no rebound or guarding.   Rectal: *** Extremities: No lower extremity edema. No clubbing or deformities.  Neuro: Alert and oriented x 4 , grossly normal neurologically.  Skin: Warm and dry, no rash or jaundice.   Psych: Alert and cooperative, normal mood  and affect.  Labs   *** Imaging Studies   No results found.  Assessment       PLAN   ***   Leanna Battles. Melvyn Neth, MHS, PA-C [facility]

## 2023-06-13 ENCOUNTER — Encounter: Payer: Self-pay | Admitting: Gastroenterology

## 2023-06-13 ENCOUNTER — Ambulatory Visit: Payer: 59 | Admitting: Gastroenterology

## 2023-06-13 VITALS — BP 106/67 | HR 86 | Temp 98.3°F | Ht 70.0 in | Wt 128.4 lb

## 2023-06-13 DIAGNOSIS — K5909 Other constipation: Secondary | ICD-10-CM | POA: Diagnosis not present

## 2023-06-13 DIAGNOSIS — Z86010 Personal history of colonic polyps: Secondary | ICD-10-CM | POA: Diagnosis not present

## 2023-06-13 MED ORDER — LUBIPROSTONE 24 MCG PO CAPS: 24.0000 ug | ORAL_CAPSULE | Freq: Two times a day (BID) | ORAL | 5 refills | Status: DC

## 2023-06-13 NOTE — Patient Instructions (Addendum)
I have sent in new medication for constipation. Start one with food twice daily. Please let us know if you are not moving your bowels more regularly, goal of bowel movement at least 4-5 days per week.  Colonoscopy to be scheduled.  Please compare medication list with your medications at home and let us know if we need to make any changes.

## 2023-06-14 ENCOUNTER — Telehealth: Payer: Self-pay | Admitting: *Deleted

## 2023-06-14 NOTE — Telephone Encounter (Signed)
LMTRC    TCS, ASA 2, RMR and need to make sure medications are correct prior to scheduling.

## 2023-06-19 ENCOUNTER — Telehealth: Payer: Self-pay | Admitting: Gastroenterology

## 2023-06-19 NOTE — Telephone Encounter (Signed)
Lmom for pt to return call. 

## 2023-06-19 NOTE — Telephone Encounter (Signed)
Patient came in to the office to say the stool softener he has been taking isn't working.  He said he was supposed to let you know if it worked or not.

## 2023-06-21 MED ORDER — NALOXEGOL OXALATE 25 MG PO TABS: 25.0000 mg | ORAL_TABLET | Freq: Every day | ORAL | 11 refills | Status: DC

## 2023-06-21 NOTE — Telephone Encounter (Signed)
Stop amitiza for now. Try Movantik one daily. RX sent to [facility] Patient should let us know if does not help improve stool frequency.

## 2023-06-21 NOTE — Telephone Encounter (Signed)
Pt was made aware and verbalized understanding.  

## 2023-06-21 NOTE — Telephone Encounter (Signed)
Pt states that the amitiza did not work that he went 4 days without a bm and had to go back to taking otc laxatives to have a bm. Please advise.

## 2023-06-26 ENCOUNTER — Ambulatory Visit (HOSPITAL_COMMUNITY): Admission: RE | Admit: 2023-06-26 | Payer: 59 | Source: Ambulatory Visit

## 2023-06-27 ENCOUNTER — Encounter: Payer: Self-pay | Admitting: *Deleted

## 2023-06-27 ENCOUNTER — Other Ambulatory Visit: Payer: Self-pay | Admitting: *Deleted

## 2023-06-27 MED ORDER — PEG 3350-KCL-NA BICARB-NACL 420 G PO SOLR: 4000.0000 mL | Freq: Once | ORAL | 0 refills | Status: AC

## 2023-06-27 NOTE — Telephone Encounter (Signed)
Pt has been scheduled for 07/27/23. Instructions mailed and prep sent to the pharmacy. Medications correct

## 2023-06-27 NOTE — Telephone Encounter (Signed)
[employer] PA: Notification or Prior Authorization is not required for the requested services You are not required to submit a notification/prior authorization based on the information provided. If you have general questions about the prior authorization requirements, visit [email or web address] > Clinician Resources > Advance and Admission Notification Requirements. The number above acknowledges your notification. Please write this reference number down for future reference. If you would like to request an organization determination, please call us at [phone]. Decision ID #: [id number]

## 2023-07-04 ENCOUNTER — Ambulatory Visit (HOSPITAL_COMMUNITY)
Admission: RE | Admit: 2023-07-04 | Discharge: 2023-07-04 | Disposition: A | Discharge status: Home / Self Care | Payer: 59 | Source: Ambulatory Visit | Attending: Acute Care | Admitting: Acute Care

## 2023-07-04 DIAGNOSIS — R911 Solitary pulmonary nodule: Secondary | ICD-10-CM | POA: Diagnosis present

## 2023-07-04 DIAGNOSIS — Z87891 Personal history of nicotine dependence: Secondary | ICD-10-CM | POA: Diagnosis present

## 2023-07-04 DIAGNOSIS — Z122 Encounter for screening for malignant neoplasm of respiratory organs: Secondary | ICD-10-CM | POA: Diagnosis present

## 2023-07-12 ENCOUNTER — Telehealth: Payer: Self-pay | Admitting: Acute Care

## 2023-07-12 NOTE — Telephone Encounter (Signed)
Call report received and routed to provider for review: IMPRESSION: 1. Marked progression in somewhat cavitary appearing consolidation in the upper lobes, right greater than left, with similar to increased peribronchovascular nodularity and ground-glass in the peripheral left lower lobe. Findings suggest an atypical or fungal infectious process. Underlying malignancy cannot be excluded. Consider bronchoscopic assessment in further evaluation, as clinically indicated. These results will be called to the ordering clinician or representative by the Radiologist Assistant, and communication documented in the PACS or [employer]. 2. Slight enlargement of mediastinal lymph nodes, likely reactive. Again, malignancy cannot be excluded. 3. Aortic atherosclerosis (ICD10-I70.0). Coronary artery calcification. 4.  Emphysema (ICD10-J43.9).

## 2023-07-12 NOTE — Telephone Encounter (Signed)
Call Report  

## 2023-07-13 ENCOUNTER — Telehealth: Payer: Self-pay | Admitting: Acute Care

## 2023-07-13 ENCOUNTER — Other Ambulatory Visit: Payer: Self-pay | Admitting: Acute Care

## 2023-07-13 DIAGNOSIS — R911 Solitary pulmonary nodule: Secondary | ICD-10-CM

## 2023-07-13 DIAGNOSIS — J189 Pneumonia, unspecified organism: Secondary | ICD-10-CM

## 2023-07-13 MED ORDER — AMOXICILLIN-POT CLAVULANATE 875-125 MG PO TABS: 1.0000 | ORAL_TABLET | Freq: Two times a day (BID) | ORAL | 0 refills | Status: DC

## 2023-07-13 NOTE — Telephone Encounter (Signed)
See office note 07/13/23

## 2023-07-13 NOTE — Telephone Encounter (Signed)
I have called the patient with the results of his low-dose screening CT. This was a follow-up CT scan to 1 that was done 03/20/2023, as the scan was read as a lung RADS 0 at the time.  Recommendation was for 24-month follow-up, which was done 07/04/2023. In that 27-month follow-up there is been progression of a somewhat cavitary appearing consolidation in the upper lobes right greater than left with similar to increased peribronchovascular nodularity and groundglass in the peripheral left lower lobe.  Per radiology these are findings that are suggestive of an atypical or fungal infectious process. Recommendation was for bronchoscopy to further assess. I reviewed the scan with Dr. Tonia Brooms, who felt the patient need to be treated with antibiotics prior to consideration of bronchoscopy. I have spoken with the patient, I have called in Augmentin twice daily for 7 days, we will do a follow-up scan in 4 weeks.  If follow-up scan shows no improvement we will schedule patient for bronchoscopy with cultures to better evaluate the finding. Patient is in agreement with this plan.  We discussed that I want him taking a probiotic or eating Activia yogurt daily while he is on the antibiotic. He states he has not felt sick, he does not have a productive cough, he states when he does cough what comes up is clear.  He denies fever, chest pain, or hemoptysis. Sherre Lain, and [place], please order CT chest without contrast for first week in October. Patient will need follow-up office visit or video visit to go over results of scan within a week of the scan being completed. If the scan does not look significantly better after treatment with antibiotic patient will most likely need bronchoscopy with cultures, and biopsy. Please fax results to PCP let him know plan is that we are treating with antibiotics and will repeat the scan in 1 month at which point in time we will determine if patient needs bronchoscopy. Thank you so  much

## 2023-07-13 NOTE — Telephone Encounter (Signed)
CT results/ plans faxed to PCP. Order placed for 4 week follow up chest CT without contrast.

## 2023-07-27 ENCOUNTER — Ambulatory Visit (HOSPITAL_BASED_OUTPATIENT_CLINIC_OR_DEPARTMENT_OTHER): Payer: 59 | Admitting: Anesthesiology

## 2023-07-27 ENCOUNTER — Encounter (HOSPITAL_COMMUNITY): Payer: Self-pay | Admitting: Internal Medicine

## 2023-07-27 ENCOUNTER — Encounter (HOSPITAL_COMMUNITY)
Admission: RE | Disposition: A | Discharge status: Home / Self Care | Payer: Self-pay | Source: Home / Self Care | Attending: Internal Medicine

## 2023-07-27 ENCOUNTER — Ambulatory Visit (HOSPITAL_COMMUNITY): Payer: 59 | Admitting: Anesthesiology

## 2023-07-27 ENCOUNTER — Ambulatory Visit (HOSPITAL_COMMUNITY)
Admission: RE | Admit: 2023-07-27 | Discharge: 2023-07-27 | Disposition: A | Discharge status: Home / Self Care | Payer: 59 | Attending: Internal Medicine | Admitting: Internal Medicine

## 2023-07-27 ENCOUNTER — Other Ambulatory Visit: Payer: Self-pay

## 2023-07-27 DIAGNOSIS — J449 Chronic obstructive pulmonary disease, unspecified: Secondary | ICD-10-CM | POA: Insufficient documentation

## 2023-07-27 DIAGNOSIS — Z1211 Encounter for screening for malignant neoplasm of colon: Secondary | ICD-10-CM | POA: Diagnosis not present

## 2023-07-27 DIAGNOSIS — F419 Anxiety disorder, unspecified: Secondary | ICD-10-CM | POA: Insufficient documentation

## 2023-07-27 DIAGNOSIS — Z5309 Procedure and treatment not carried out because of other contraindication: Secondary | ICD-10-CM | POA: Insufficient documentation

## 2023-07-27 DIAGNOSIS — F17210 Nicotine dependence, cigarettes, uncomplicated: Secondary | ICD-10-CM | POA: Diagnosis not present

## 2023-07-27 DIAGNOSIS — Z86010 Personal history of colonic polyps: Secondary | ICD-10-CM | POA: Diagnosis not present

## 2023-07-27 DIAGNOSIS — E039 Hypothyroidism, unspecified: Secondary | ICD-10-CM

## 2023-07-27 HISTORY — PX: FLEXIBLE SIGMOIDOSCOPY: SHX5431

## 2023-07-27 SURGERY — SIGMOIDOSCOPY, FLEXIBLE
Anesthesia: General

## 2023-07-27 MED ORDER — PROPOFOL 10 MG/ML IV BOLUS
INTRAVENOUS | Status: DC | PRN
  Administered 2023-07-27: 100 mg via INTRAVENOUS

## 2023-07-27 MED ORDER — EPHEDRINE SULFATE (PRESSORS) 50 MG/ML IJ SOLN
INTRAMUSCULAR | Status: DC | PRN
  Administered 2023-07-27: 2.5 mg via INTRAVENOUS
  Administered 2023-07-27: 5 mg via INTRAVENOUS

## 2023-07-27 MED ORDER — PHENYLEPHRINE HCL (PRESSORS) 10 MG/ML IV SOLN
INTRAVENOUS | Status: DC | PRN
  Administered 2023-07-27: 80 ug via INTRAVENOUS
  Administered 2023-07-27: 40 ug via INTRAVENOUS

## 2023-07-27 MED ORDER — LACTATED RINGERS IV SOLN: INTRAVENOUS | Status: DC

## 2023-07-27 NOTE — Transfer of Care (Signed)
Immediate Anesthesia Transfer of Care Note  Patient: Jaime [name]  Procedure(s) Performed: FLEXIBLE SIGMOIDOSCOPY  Patient Location: Endoscopy Unit  Anesthesia Type:General  Level of Consciousness: awake and patient cooperative  Airway & Oxygen Therapy: Patient Spontanous Breathing  Post-op Assessment: Report given to RN and Post -op Vital signs reviewed and stable  Post vital signs: Reviewed and stable  Last Vitals:  Vitals Value Taken Time  BP 106/51 07/27/23 0921  Temp 36.8 C 07/27/23 0921  Pulse 64 07/27/23 0921  Resp 13 07/27/23 0921  SpO2 98 % 07/27/23 0921    Last Pain:  Vitals:   07/27/23 0921  TempSrc: Oral  PainSc: 0-No pain      Patients Stated Pain Goal: 10 (07/27/23 0810)  Complications: No notable events documented.

## 2023-07-27 NOTE — Anesthesia Preprocedure Evaluation (Signed)
Anesthesia Evaluation  Patient identified by MRN, date of birth, ID band Patient awake    Reviewed: Allergy & Precautions, H&P , NPO status , Patient's Chart, lab work & pertinent test results, reviewed documented beta blocker date and time   Airway Mallampati: II  TM Distance: >3 FB Neck ROM: full    Dental no notable dental hx.    Pulmonary neg pulmonary ROS, COPD, Current Smoker   Pulmonary exam normal breath sounds clear to auscultation       Cardiovascular Exercise Tolerance: Good negative cardio ROS  Rhythm:regular Rate:Normal     Neuro/Psych   Anxiety     negative neurological ROS  negative psych ROS   GI/Hepatic negative GI ROS, Neg liver ROS,,,  Endo/Other  negative endocrine ROSHypothyroidism    Renal/GU negative Renal ROS  negative genitourinary   Musculoskeletal   Abdominal   Peds  Hematology negative hematology ROS (+)   Anesthesia Other Findings   Reproductive/Obstetrics negative OB ROS                             Anesthesia Physical Anesthesia Plan  ASA: 3  Anesthesia Plan: General   Post-op Pain Management:    Induction:   PONV Risk Score and Plan: Propofol infusion  Airway Management Planned:   Additional Equipment:   Intra-op Plan:   Post-operative Plan:   Informed Consent: I have reviewed the patients History and Physical, chart, labs and discussed the procedure including the risks, benefits and alternatives for the proposed anesthesia with the patient or authorized representative who has indicated his/her understanding and acceptance.     Dental Advisory Given  Plan Discussed with: CRNA  Anesthesia Plan Comments:        Anesthesia Quick Evaluation

## 2023-07-27 NOTE — Anesthesia Procedure Notes (Signed)
Date/Time: 07/27/2023 9:06 AM  Performed by: Franco Nones, CRNAPre-anesthesia Checklist: Patient identified, Emergency Drugs available, Suction available, Timeout performed and Patient being monitored Patient Re-evaluated:Patient Re-evaluated prior to induction Oxygen Delivery Method: Nasal Cannula

## 2023-07-27 NOTE — Op Note (Signed)
[facility] Patient Name: Jaime [name] Procedure Date: 07/27/2023 8:44 AM MRN: [id number] Date of Birth: [birth year] Attending MD: Gennette Pac , MD, [id number] CSN: [id number] Age: 67 Admit Type: Outpatient Procedure:                Colonoscopy Indications:              High risk colon cancer surveillance: Personal                            history of colonic polyps Providers:                Gennette Pac, MD, Buel Ream. Thomasena Edis RN, RN,                            Elinor Parkinson Referring MD:             Gennette Pac, MD Medicines:                Propofol per Anesthesia Complications:            No immediate complications. Estimated Blood Loss:     Estimated blood loss: none. Procedure:                Pre-Anesthesia Assessment:                           - Prior to the procedure, a History and Physical                            was performed, and patient medications and                            allergies were reviewed. The patient's tolerance of                            previous anesthesia was also reviewed. The risks                            and benefits of the procedure and the sedation                            options and risks were discussed with the patient.                            All questions were answered, and informed consent                            was obtained. Prior Anticoagulants: The patient has                            taken no anticoagulant or antiplatelet agents. ASA                            Grade Assessment: II - A patient with mild systemic  disease. After reviewing the risks and benefits,                            the patient was deemed in satisfactory condition to                            undergo the procedure.                           After obtaining informed consent, the colonoscope                            was passed under direct vision. Throughout the                             procedure, the patient's blood pressure, pulse, and                            oxygen saturations were monitored continuously. The                            [id number] scope was introduced through the                            anus with the intention of advancing to the cecum.                            The scope was advanced to the sigmoid colon before                            the procedure was aborted. Medications were given.                            The colonoscopy was performed without difficulty.                            The patient tolerated the procedure well. The                            quality of the bowel preparation was inadequate. Scope In: 9:13:11 AM Scope Out: 9:15:36 AM Total Procedure Duration: 0 hours 2 minutes 25 seconds  Findings:      Rectum and sigmoid colon contained formed and semiformed stool which       precluded completing a colonoscopy today. Please see photos. Impression:               - Preparation of the colon was inadequate. Aborted                            attempt at colonoscopy.                           - No specimens collected. Moderate Sedation:      Moderate (conscious) sedation was personally administered by an       anesthesia professional.  The following parameters were monitored: oxygen       saturation, heart rate, blood pressure, respiratory rate, EKG, adequacy       of pulmonary ventilation, and response to care. Recommendation:           - Patient has a contact number available for                            emergencies. The signs and symptoms of potential                            delayed complications were discussed with the                            patient. Return to normal activities tomorrow.                            Written discharge instructions were provided to the                            patient.                           - Advance diet as tolerated. Office will be in                            touch about  setting up a second attempt at                            colonoscopy in the near future. Procedure Code(s):        --- Professional ---                           [phone], 53, Colonoscopy, flexible; diagnostic,                            including collection of specimen(s) by brushing or                            washing, when performed (separate procedure) Diagnosis Code(s):        --- Professional ---                           Z86.010, Personal history of colonic polyps CPT copyright 2022 American Medical Association. All rights reserved. The codes documented in this report are preliminary and upon coder review may  be revised to meet current compliance requirements. Gerrit Friends. Yamen Castrogiovanni, MD Gennette Pac, MD 07/27/2023 9:28:02 AM This report has been signed electronically. Number of Addenda: 0

## 2023-07-27 NOTE — H&P (Signed)
@LOGO @   Primary Care Physician:  [name], PA-C Primary Gastroenterologist:  Dr. Jena Gauss  Pre-Procedure History & Physical: HPI:  Jaime [name] is a 67 y.o. male here for  surveillance colonoscopy.  History of colonic adenomas removed previously.  Recent constipation now well-managed.  Past Medical History:  Diagnosis Date   Anxiety    Arthritis    BPH (benign prostatic hyperplasia)    Chest pain    Chronic pain    neck   Constipation    Genital herpes    Hypothyroidism    Neuropathy    Paralysis, unspecified    Upper extremity weakness s/p severe neck injury   Tobacco abuse     Past Surgical History:  Procedure Laterality Date   CHOLECYSTECTOMY  1982   chordoma  1996   COLONOSCOPY  2001   pt reports [facility]   COLONOSCOPY  05/30/2012   Dr. Jena Gauss: tubular adenomas removed. next TCS in 5 years.    COLONOSCOPY WITH PROPOFOL N/A 06/14/2018   Procedure: COLONOSCOPY WITH PROPOFOL;  Surgeon: Corbin Ade, MD;  [facility];  Service: Endoscopy;  Laterality: N/A;  2:15pm-moved to 11:15 per Mindy   MASTOIDECTOMY  1991   NECK SURGERY  2009   fracture C3-7    Prior to Admission medications   Medication Sig Start Date End Date Taking? Authorizing Provider  acetaminophen (TYLENOL) 500 MG tablet Take 500 mg by mouth daily as needed for mild pain or moderate pain.    Yes [provider]  albuterol (VENTOLIN HFA) 108 (90 Base) MCG/ACT inhaler Inhale 2 puffs into the lungs every 6 (six) hours as needed. 05/08/23  Yes [provider]  atorvastatin (LIPITOR) 10 MG tablet Take 10 mg by mouth daily.   Yes [provider]  fluticasone (FLONASE) 50 MCG/ACT nasal spray Place 2 sprays into both nostrils daily. 04/23/20  Yes [provider]  gabapentin (NEURONTIN) 400 MG capsule Take 400 mg by mouth 2 (two) times daily.   Yes [provider]  HYDROcodone-acetaminophen (NORCO) 10-325 MG per tablet Take 1 tablet by mouth 4  (four) times daily.   Yes [provider]  naloxegol oxalate (MOVANTIK) 25 MG TABS tablet Take 1 tablet (25 mg total) by mouth daily. 06/21/23  Yes Tiffany Kocher, PA-C  TRELEGY ELLIPTA 200-62.5-25 MCG/ACT AEPB daily. 06/12/23  Yes [provider]  valACYclovir (VALTREX) 1000 MG tablet Take 1,000 mg by mouth 2 (two) times daily.   Yes [provider]  amoxicillin-clavulanate (AUGMENTIN) 875-125 MG tablet Take 1 tablet by mouth 2 (two) times daily. 07/13/23   Bevelyn Ngo, NP  ibuprofen (ADVIL) 800 MG tablet Take 800 mg by mouth 3 (three) times daily as needed. 11/20/19   [provider]    Allergies as of 06/27/2023 - Review Complete 06/13/2023  Allergen Reaction Noted   Celebrex [celecoxib] Itching 10/28/2011   Ciprofloxacin Hives 12/14/2011   Contrast media [iodinated contrast media]  05/21/2012   Dilaudid [hydromorphone] Other (See Comments) 05/30/2012   Latex Hives 11/28/2011   Morphine and codeine  12/14/2011   Sulfa antibiotics Other (See Comments) 10/28/2011    Family History  Problem Relation Age of Onset   Hypertension Mother    Heart disease Father    Lung cancer Father    Heart disease Sister    Lung cancer Brother    Colon cancer Neg Hx     Social History   Socioeconomic History   Marital status:  Divorced    Spouse name: Not on file   Number of children: 4   Years of education: Not on file   Highest education level: Not on file  Occupational History   Occupation: disabled  Tobacco Use   Smoking status: Every Day    Current packs/day: 1.50    Average packs/day: 1.5 packs/day for 44.0 years (66.0 ttl pk-yrs)    Types: Cigarettes   Smokeless tobacco: Never  Vaping Use   Vaping status: Not on file  Substance and Sexual Activity   Alcohol use: No    Comment: quit about 4 years ago; denied 04/05/18   Drug use: No   Sexual activity: Not Currently    Birth control/protection: None  Other Topics Concern   Not on file  Social  History Narrative   Lives w/ significant other   Social Determinants of Health   Financial Resource Strain: Not on file  Food Insecurity: Not on file  Transportation Needs: Not on file  Physical Activity: Not on file  Stress: Not on file  Social Connections: Not on file  Intimate Partner Violence: Not on file    Review of Systems: See HPI, otherwise negative ROS  Physical Exam: BP 112/68   Pulse 76   Temp 98.6 F (37 C) (Oral)   Resp 13   Ht 5\' 10"  (1.778 m)   Wt 57.2 kg   SpO2 97%   BMI 18.08 kg/m  General:   Alert,  Well-developed, well-nourished, pleasant and cooperative in NAD pathy. Lungs:  Clear throughout to auscultation.   No wheezes, crackles, or rhonchi. No acute distress. Heart:  Regular rate and rhythm; no murmurs, clicks, rubs,  or gallops. Abdomen: Non-distended, normal bowel sounds.  Soft and nontender without appreciable mass or hepatosplenomegaly.  Pulses:  Normal pulses noted. Extremities:  Without clubbing or edema.  Impression/Plan:    67 year old gentleman here for surveillance colonoscopy.  History of colonic polyps..  The risks, benefits, limitations, alternatives and imponderables have been reviewed with the patient. Questions have been answered. All parties are agreeable.       Notice: This dictation was prepared with Dragon dictation along with smaller phrase technology. Any transcriptional errors that result from this process are unintentional and may not be corrected upon review.

## 2023-07-27 NOTE — Discharge Instructions (Signed)
  Colonoscopy Discharge Instructions  Read the instructions outlined below and refer to this sheet in the next few weeks. These discharge instructions provide you with general information on caring for yourself after you leave the hospital. Your doctor may also give you specific instructions. While your treatment has been planned according to the most current medical practices available, unavoidable complications occasionally occur. If you have any problems or questions after discharge, call Dr. Jena Gauss at [phone]. ACTIVITY You may resume your regular activity, but move at a slower pace for the next 24 hours.  Take frequent rest periods for the next 24 hours.  Walking will help get rid of the air and reduce the bloated feeling in your belly (abdomen).  No driving for 24 hours (because of the medicine (anesthesia) used during the test).   Do not sign any important legal documents or operate any machinery for 24 hours (because of the anesthesia used during the test).  NUTRITION Drink plenty of fluids.  You may resume your normal diet as instructed by your doctor.  Begin with a light meal and progress to your normal diet. Heavy or fried foods are harder to digest and may make you feel sick to your stomach (nauseated).  Avoid alcoholic beverages for 24 hours or as instructed.  MEDICATIONS You may resume your normal medications unless your doctor tells you otherwise.  WHAT YOU CAN EXPECT TODAY Some feelings of bloating in the abdomen.  Passage of more gas than usual.  Spotting of blood in your stool or on the toilet paper.  IF YOU HAD POLYPS REMOVED DURING THE COLONOSCOPY: No aspirin products for 7 days or as instructed.  No alcohol for 7 days or as instructed.  Eat a soft diet for the next 24 hours.  FINDING OUT THE RESULTS OF YOUR TEST Not all test results are available during your visit. If your test results are not back during the visit, make an appointment with your caregiver to find out the  results. Do not assume everything is normal if you have not heard from your caregiver or the medical facility. It is important for you to follow up on all of your test results.  SEEK IMMEDIATE MEDICAL ATTENTION IF: You have more than a spotting of blood in your stool.  Your belly is swollen (abdominal distention).  You are nauseated or vomiting.  You have a temperature over 101.  You have abdominal pain or discomfort that is severe or gets worse throughout the day.      colonoscopy could not be completed today because you were not cleaned out.    My office will be in touch with you in the near future   at patient request, I called Liliane Shi at [phone]-reviewed findings and recommendations

## 2023-07-28 NOTE — Anesthesia Postprocedure Evaluation (Signed)
Anesthesia Post Note  Patient: Jaime [name]  Procedure(s) Performed: FLEXIBLE SIGMOIDOSCOPY  Patient location during evaluation: Phase II Anesthesia Type: General Level of consciousness: awake Pain management: pain level controlled Vital Signs Assessment: post-procedure vital signs reviewed and stable Respiratory status: spontaneous breathing and respiratory function stable Cardiovascular status: blood pressure returned to baseline and stable Postop Assessment: no headache and no apparent nausea or vomiting Anesthetic complications: no Comments: Late entry   No notable events documented.   Last Vitals:  Vitals:   07/27/23 0810 07/27/23 0921  BP: 112/68 (!) 106/51  Pulse: 76 64  Resp: 13 13  Temp: 37 C 36.8 C  SpO2: 97% 98%    Last Pain:  Vitals:   07/27/23 0921  TempSrc: Oral  PainSc: 0-No pain                 Windell Norfolk

## 2023-08-07 ENCOUNTER — Encounter (HOSPITAL_COMMUNITY): Payer: Self-pay | Admitting: Internal Medicine

## 2023-08-08 ENCOUNTER — Ambulatory Visit (HOSPITAL_COMMUNITY): Payer: 59

## 2023-08-10 ENCOUNTER — Other Ambulatory Visit: Payer: Self-pay | Admitting: Gastroenterology

## 2023-08-16 ENCOUNTER — Telehealth: Payer: Self-pay | Admitting: Internal Medicine

## 2023-08-16 NOTE — Telephone Encounter (Signed)
I called patient last week and left a VM for him to return my call. No replay. I called this morning and offered patient an OV to come in prior to scheduling another colonoscopy. He said, he was not going to do another colonoscopy and it was no use in making him an appointment. He said at his age he did not want to schedule anything else. I asked him, "So, you do not want to have a colonoscopy"?  He replied, "No".

## 2023-08-25 ENCOUNTER — Other Ambulatory Visit: Payer: Self-pay | Admitting: Acute Care

## 2023-08-25 DIAGNOSIS — J189 Pneumonia, unspecified organism: Secondary | ICD-10-CM

## 2023-09-13 ENCOUNTER — Telehealth: Payer: Self-pay | Admitting: Internal Medicine

## 2023-09-13 NOTE — Telephone Encounter (Signed)
Needs new patient approval

## 2023-09-13 NOTE — Telephone Encounter (Signed)
Scheduled

## 2023-10-24 ENCOUNTER — Ambulatory Visit (INDEPENDENT_AMBULATORY_CARE_PROVIDER_SITE_OTHER): Payer: 59 | Admitting: Internal Medicine

## 2023-10-24 ENCOUNTER — Encounter: Payer: Self-pay | Admitting: Internal Medicine

## 2023-10-24 VITALS — BP 106/70 | HR 79 | Ht 69.0 in | Wt 127.4 lb

## 2023-10-24 DIAGNOSIS — G8929 Other chronic pain: Secondary | ICD-10-CM

## 2023-10-24 DIAGNOSIS — R918 Other nonspecific abnormal finding of lung field: Secondary | ICD-10-CM

## 2023-10-24 DIAGNOSIS — B356 Tinea cruris: Secondary | ICD-10-CM | POA: Diagnosis not present

## 2023-10-24 DIAGNOSIS — M549 Dorsalgia, unspecified: Secondary | ICD-10-CM

## 2023-10-24 DIAGNOSIS — Z720 Tobacco use: Secondary | ICD-10-CM

## 2023-10-24 DIAGNOSIS — J984 Other disorders of lung: Secondary | ICD-10-CM

## 2023-10-24 DIAGNOSIS — J439 Emphysema, unspecified: Secondary | ICD-10-CM

## 2023-10-24 DIAGNOSIS — E782 Mixed hyperlipidemia: Secondary | ICD-10-CM

## 2023-10-24 MED ORDER — NYSTATIN 100000 UNIT/GM EX POWD
1.0000 | Freq: Three times a day (TID) | CUTANEOUS | 0 refills | Status: AC
Start: 2023-10-24 — End: ?

## 2023-10-24 MED ORDER — ATORVASTATIN CALCIUM 10 MG PO TABS: 10.0000 mg | ORAL_TABLET | Freq: Every day | ORAL | 3 refills | Status: AC

## 2023-10-24 MED ORDER — TRELEGY ELLIPTA 200-62.5-25 MCG/ACT IN AEPB
1.0000 | INHALATION_SPRAY | Freq: Every day | RESPIRATORY_TRACT | 1 refills | Status: DC
Start: 2023-10-24 — End: 2023-12-25

## 2023-10-24 MED ORDER — ALBUTEROL SULFATE HFA 108 (90 BASE) MCG/ACT IN AERS: 2.0000 | INHALATION_SPRAY | Freq: Four times a day (QID) | RESPIRATORY_TRACT | 3 refills | Status: AC | PRN

## 2023-10-24 NOTE — Progress Notes (Unsigned)
New Patient Office Visit  Subjective    Patient ID: Jaime [name], male    DOB: 25-Jul-1956  Age: 67 y.o. 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  Outpatient Encounter Medications as of 10/24/2023  Medication Sig   albuterol (VENTOLIN HFA) 108 (90 Base) MCG/ACT inhaler Inhale 2 puffs into the lungs every 6 (six) hours as needed.   atorvastatin (LIPITOR) 10 MG tablet Take 10 mg by mouth daily.   gabapentin (NEURONTIN) 400 MG capsule Take 400 mg by mouth 2 (two) times daily.   HYDROcodone-acetaminophen (NORCO) 10-325 MG per tablet Take 1 tablet by mouth 4 (four) times daily.   naloxegol oxalate (MOVANTIK) 25 MG TABS tablet Take 1 tablet (25 mg total) by mouth daily.   valACYclovir (VALTREX) 1000 MG tablet Take 1,000 mg by mouth 2 (two) times daily.   acetaminophen (TYLENOL) 500 MG tablet Take 500 mg by mouth daily as needed for mild pain or moderate pain.  (Patient not taking: Reported on 10/24/2023)   amoxicillin-clavulanate (AUGMENTIN) 875-125 MG tablet Take 1 tablet by mouth 2 (two) times daily. (Patient not taking: Reported on 10/24/2023)   donepezil (ARICEPT) 10 MG tablet donepezil 10 mg tablet (Patient not taking: Reported on 10/24/2023)   fluticasone (FLONASE) 50 MCG/ACT nasal spray Place 2 sprays into both nostrils daily. (Patient not taking: Reported on 10/24/2023)   ibuprofen (ADVIL) 800 MG tablet Take 800 mg by mouth 3 (three) times daily as needed. (Patient not taking: Reported on 10/24/2023)    TRELEGY ELLIPTA 200-62.5-25 MCG/ACT AEPB daily. (Patient not taking: Reported on 10/24/2023)   No facility-administered encounter medications on file as of 10/24/2023.    Past Medical History:  Diagnosis Date   Anxiety    Arthritis    BPH (benign prostatic hyperplasia)    Chest pain    Chronic pain    neck   Constipation    Genital herpes    Hypothyroidism    Neuropathy    Paralysis, unspecified    Upper extremity weakness s/p severe neck injury   Tobacco abuse     Past Surgical History:  Procedure Laterality Date   CHOLECYSTECTOMY  1982   chordoma  1996   COLONOSCOPY  2001   pt reports normal at [facility]   COLONOSCOPY  05/30/2012   Dr. Jena Gauss: tubular adenomas removed. next TCS in 5 years.    COLONOSCOPY WITH PROPOFOL N/A 06/14/2018   Procedure: COLONOSCOPY WITH PROPOFOL;  Surgeon: Corbin Ade, MD;  Location: [facility];  Service: Endoscopy;  Laterality: N/A;  2:15pm-moved to 11:15 per Mindy   FLEXIBLE SIGMOIDOSCOPY N/A 07/27/2023   Procedure: FLEXIBLE SIGMOIDOSCOPY;  Surgeon: Corbin Ade, MD;  Location: [facility];  Service: Endoscopy;  Laterality: N/A;   MASTOIDECTOMY  1991   NECK SURGERY  2009   fracture C3-7    Family History  Problem Relation Age of Onset   Hypertension Mother    Heart disease Father  Lung cancer Father    Heart disease Sister    Lung cancer Brother    Colon cancer Neg Hx     Social History   Socioeconomic History   Marital status: Divorced    Spouse name: Not on file   Number of children: 4   Years of education: Not on file   Highest education level: Not on file  Occupational History   Occupation: disabled  Tobacco Use   Smoking status: Every Day    Current packs/day: 1.50    Average packs/day: 1.5 packs/day for 44.0 years (66.0 ttl pk-yrs)    Types: Cigarettes   Smokeless tobacco: Never  Vaping Use   Vaping status: Not on file  Substance and Sexual Activity   Alcohol use: No    Comment: quit about 4 years ago;  denied 04/05/18   Drug use: No   Sexual activity: Not Currently    Birth control/protection: None  Other Topics Concern   Not on file  Social History Narrative   Lives w/ significant other   Social Drivers of Corporate investment banker Strain: Not on file  Food Insecurity: Not on file  Transportation Needs: Not on file  Physical Activity: Not on file  Stress: Not on file  Social Connections: Not on file  Intimate Partner Violence: Not on file    ROS      Objective    BP 106/70 (BP Location: Left Arm, Patient Position: Sitting, Cuff Size: Normal)   Pulse 79   Ht 5\' 9"  (1.753 m)   Wt 127 lb 6.4 oz (57.8 kg)   SpO2 100%   BMI 18.81 kg/m   Physical Exam  {Labs (Optional):23779}    Assessment & Plan:   Problem List Items Addressed This Visit   None   No follow-ups on file.   Billie Lade, MD

## 2023-10-24 NOTE — Patient Instructions (Signed)
It was a pleasure to see you today.  Thank you for giving [place] the opportunity to be involved in your care.  Below is a brief recap of your visit and next steps.  We will plan to see you again in 3 months.  Summary You have established care today No medication changes were made Refills provided Reschedule CT chest Follow up in 3 months

## 2023-10-25 ENCOUNTER — Ambulatory Visit (HOSPITAL_COMMUNITY)
Admission: RE | Admit: 2023-10-25 | Discharge: 2023-10-25 | Disposition: A | Discharge status: Home / Self Care | Payer: 59 | Source: Ambulatory Visit | Attending: Internal Medicine | Admitting: Internal Medicine

## 2023-10-25 ENCOUNTER — Other Ambulatory Visit: Payer: Self-pay | Admitting: Internal Medicine

## 2023-10-25 DIAGNOSIS — R918 Other nonspecific abnormal finding of lung field: Secondary | ICD-10-CM | POA: Diagnosis present

## 2023-10-25 DIAGNOSIS — E782 Mixed hyperlipidemia: Secondary | ICD-10-CM

## 2023-10-25 DIAGNOSIS — B356 Tinea cruris: Secondary | ICD-10-CM

## 2023-10-25 DIAGNOSIS — J439 Emphysema, unspecified: Secondary | ICD-10-CM

## 2023-10-30 ENCOUNTER — Other Ambulatory Visit: Payer: Self-pay | Admitting: Internal Medicine

## 2023-11-17 DIAGNOSIS — J449 Chronic obstructive pulmonary disease, unspecified: Secondary | ICD-10-CM | POA: Diagnosis not present

## 2023-11-17 DIAGNOSIS — Z79899 Other long term (current) drug therapy: Secondary | ICD-10-CM | POA: Diagnosis not present

## 2023-11-17 DIAGNOSIS — Z0000 Encounter for general adult medical examination without abnormal findings: Secondary | ICD-10-CM | POA: Diagnosis not present

## 2023-11-17 DIAGNOSIS — Z681 Body mass index (BMI) 19 or less, adult: Secondary | ICD-10-CM | POA: Diagnosis not present

## 2023-11-17 DIAGNOSIS — M545 Low back pain, unspecified: Secondary | ICD-10-CM | POA: Diagnosis not present

## 2023-11-19 ENCOUNTER — Encounter: Payer: Self-pay | Admitting: Internal Medicine

## 2023-11-19 DIAGNOSIS — E785 Hyperlipidemia, unspecified: Secondary | ICD-10-CM | POA: Insufficient documentation

## 2023-11-19 DIAGNOSIS — J984 Other disorders of lung: Secondary | ICD-10-CM | POA: Insufficient documentation

## 2023-11-19 DIAGNOSIS — B356 Tinea cruris: Secondary | ICD-10-CM | POA: Insufficient documentation

## 2023-11-19 NOTE — Assessment & Plan Note (Signed)
 He endorses current tobacco use, smoking between 1-1.5 packs of cigarettes per day.  He has been smoking since age 67 and is precontemplative with regards to cessation.  Currently enrolled in lung cancer screening.

## 2023-11-19 NOTE — Assessment & Plan Note (Signed)
 He endorses a history of tinea cruris and uses nystatin powder as needed.

## 2023-11-19 NOTE — Assessment & Plan Note (Signed)
 Currently prescribed atorvastatin 10 mg daily.  Reports that labs were recently updated and his cholesterol is under adequate control.  No medication changes were made at this time.

## 2023-11-19 NOTE — Assessment & Plan Note (Signed)
 He endorses a history of chronic cervical and lumbar back pain.  Followed by pain management at [facility] and is prescribed hydrocodone -acetaminophen  10-325 mg 6 times daily and gabapentin 400 mg twice daily.  Pain is adequately controlled on this regimen.  PDMP reviewed.

## 2023-11-19 NOTE — Assessment & Plan Note (Signed)
 Multiple new aggressive appearing areas noted in the upper lung fields bilaterally on LDCT from May 2024.  Repeat CT recommended for 3 months.  CT in August showed marked progression and somewhat cavitary appearing consolidations in the upper lobes (R>L).  Findings are suggestive of atypical or fungal infectious process.  Underlying malignancy not excluded.  Repeat CT chest ordered for 3 months per pulmonology but not completed. -New CT chest wo contrast ordered today.  Further management pending results.

## 2023-11-19 NOTE — Assessment & Plan Note (Addendum)
 No PFTs available for review.  Asymptomatic currently.  Pulmonary exam is unremarkable.  He is prescribed Trelegy and albuterol.  No medication changes are indicated at this time.

## 2023-12-06 ENCOUNTER — Ambulatory Visit: Payer: Self-pay | Admitting: Internal Medicine

## 2023-12-06 NOTE — Telephone Encounter (Signed)
Copied from CRM [id number]. Topic: Clinical - Red Word Triage >> Dec 06, 2023  5:32 PM Elle L wrote: Red Word that prompted transfer to Nurse Triage: The patient has had a cold for a few months that has worsened. He is congested and having difficulty breathing.   Chief Complaint: Cough Symptoms: Cough, runny nose, mild shortness of breath  Frequency: Frequent cough Pertinent Negatives: Patient denies fevers Disposition: [] ED /[] Urgent Care (no appt availability in office) / [x] Appointment(In office/virtual)/ []  [place]/ [] Home Care/ [] Refused Recommended Disposition /[]  Mobile Bus/ []  Follow-up with PCP Additional Notes: Patient reports having a cough for the last 2-3 months. He states his cough has been productive of clear sputum but states he has not had any sputum today. He states he has also been experiencing a runny nose and is now having some difficulty breathing that he states is due to his congestion. Patient denies any fevers. Appointment made for the patient on Friday and patient was instructed to call back for new or worsening symptoms. Patient verbalized understanding and agreement of this plan.     Reason for Disposition  Cough has been present for > 3 weeks  Answer Assessment - Initial Assessment Questions 1. ONSET: "When did the cough begin?"      2 months ago  2. SEVERITY: "How bad is the cough today?"      Improved  3. SPUTUM: "Describe the color of your sputum" (none, dry cough; clear, white, yellow, green)     Clear sputum, nothing today 4. HEMOPTYSIS: "Are you coughing up any blood?" If so ask: "How much?" (flecks, streaks, tablespoons, etc.)     No 5. DIFFICULTY BREATHING: "Are you having difficulty breathing?" If Yes, ask: "How bad is it?" (e.g., mild, moderate, severe)    - MILD: No SOB at rest, mild SOB with walking, speaks normally in sentences, can lie down, no retractions, pulse < 100.    - MODERATE: SOB at rest, SOB with minimal exertion  and prefers to sit, cannot lie down flat, speaks in phrases, mild retractions, audible wheezing, pulse 100-120.    - SEVERE: Very SOB at rest, speaks in single words, struggling to breathe, sitting hunched forward, retractions, pulse > 120      Mild 6. FEVER: "Do you have a fever?" If Yes, ask: "What is your temperature, how was it measured, and when did it start?"     No 7. CARDIAC HISTORY: "Do you have any history of heart disease?" (e.g., heart attack, congestive heart failure)      No 8. LUNG HISTORY: "Do you have any history of lung disease?"  (e.g., pulmonary embolus, asthma, emphysema)     No 9. PE RISK FACTORS: "Do you have a history of blood clots?" (or: recent major surgery, recent prolonged travel, bedridden)     No 10. OTHER SYMPTOMS: "Do you have any other symptoms?" (e.g., runny nose, wheezing, chest pain)       Runny nose, fatigue 12. TRAVEL: "Have you traveled out of the country in the last month?" (e.g., travel history, exposures)       No  Protocols used: Cough - Acute Productive-A-AH

## 2023-12-08 ENCOUNTER — Ambulatory Visit: Payer: Self-pay | Admitting: Professional Counselor

## 2023-12-08 ENCOUNTER — Encounter: Payer: Self-pay | Admitting: Family Medicine

## 2023-12-08 ENCOUNTER — Ambulatory Visit (INDEPENDENT_AMBULATORY_CARE_PROVIDER_SITE_OTHER): Payer: Medicare PPO | Admitting: Family Medicine

## 2023-12-08 VITALS — BP 109/69 | HR 96 | Ht 69.0 in | Wt 127.0 lb

## 2023-12-08 DIAGNOSIS — J069 Acute upper respiratory infection, unspecified: Secondary | ICD-10-CM | POA: Insufficient documentation

## 2023-12-08 MED ORDER — IPRATROPIUM BROMIDE 0.06 % NA SOLN: 2.0000 | Freq: Three times a day (TID) | NASAL | 1 refills | Status: AC

## 2023-12-08 MED ORDER — BENZONATATE 200 MG PO CAPS
200.0000 mg | ORAL_CAPSULE | Freq: Two times a day (BID) | ORAL | 1 refills | Status: DC | PRN
Start: 2023-12-08 — End: 2023-12-14

## 2023-12-08 MED ORDER — AZITHROMYCIN 250 MG PO TABS
ORAL_TABLET | ORAL | 0 refills | Status: AC
Start: 2023-12-08 — End: ?

## 2023-12-08 NOTE — Assessment & Plan Note (Signed)
Azithromycin 250 mg twice daily x 5 days, ipratropium (ATROVENT) 0.03 % nasal spray 3 times daily,Benzonatate 200 mg PRN,  Advise patient to rest to support your body's recovery. Stay hydrated by drinking water, tea, or broth. Using a humidifier can help soothe throat irritation and ease nasal congestion. For fever or pain, acetaminophen (Tylenol) is recommended. To relieve other symptoms, try saline nasal sprays, throat lozenges, or gargling with saltwater. Focus on eating light, healthy meals like fruits and vegetables to keep your strength up. Practice good hygiene by washing your hands frequently and covering your mouth when coughing or sneezing.Follow-up for worsening or persistent symptoms. Patient verbalizes understanding regarding plan of care and all questions answered

## 2023-12-08 NOTE — Progress Notes (Signed)
Established Patient Office Visit   Subjective  Patient ID: Jaime [name], male    DOB: 24-Oct-1956  Age: 68 y.o. MRN: [id number]  Chief Complaint  Patient presents with   Acute Visit    Cough, runny nose SOB w/ yellow mucus x 2 months     He  has a past medical history of Anxiety, Arthritis, BPH (benign prostatic hyperplasia), BPH (benign prostatic hyperplasia) (06/08/2017), Chest pain, Chronic pain, Constipation, Genital herpes, Hypothyroidism, Neuropathy, Paralysis, unspecified, and Tobacco abuse.  Patient complains of persistent cough. Patient describes symptoms of shortness of breath at rest, fatigue, headache, malaise, myalgias, and wheezing. Symptoms began 2 months ago and are gradually worsening since that time. Patient denies chest pain or nausea and vomiting. Treatment thus far includes OTC analgesics/antipyretics: somewhat effective Past pulmonary history is significant for pneumonia     Review of Systems  Constitutional:  Positive for malaise/fatigue.  HENT:  Positive for congestion, ear pain, sinus pain and sore throat.   Respiratory:  Positive for cough, shortness of breath and wheezing.   Cardiovascular:  Negative for chest pain.  Gastrointestinal:  Negative for abdominal pain.  Neurological:  Positive for headaches. Negative for dizziness.      Objective:     BP 109/69   Pulse 96   Ht 5\' 9"  (1.753 m)   Wt 127 lb 0.6 oz (57.6 kg)   SpO2 95%   BMI 18.76 kg/m  BP Readings from Last 3 Encounters:  12/08/23 109/69  10/24/23 106/70  07/27/23 (!) 106/51      Physical Exam Vitals reviewed.  Constitutional:      General: He is not in acute distress.    Appearance: Normal appearance. He is not ill-appearing, toxic-appearing or diaphoretic.  HENT:     Head: Normocephalic.     Right Ear: Tympanic membrane normal.     Left Ear: Tympanic membrane normal.     Nose: Congestion and rhinorrhea present.  Eyes:     General:        Right eye: No discharge.         Left eye: No discharge.     Conjunctiva/sclera: Conjunctivae normal.  Cardiovascular:     Rate and Rhythm: Normal rate.     Pulses: Normal pulses.     Heart sounds: Normal heart sounds.  Pulmonary:     Effort: Pulmonary effort is normal. No respiratory distress.     Breath sounds: Wheezing and rhonchi present.  Skin:    General: Skin is warm and dry.     Capillary Refill: Capillary refill takes less than 2 seconds.  Neurological:     Mental Status: He is alert.  Psychiatric:        Mood and Affect: Mood normal.        Behavior: Behavior normal.      No results found for any visits on 12/08/23.  The ASCVD Risk score (Arnett DK, et al., 2019) failed to calculate for the following reasons:   Cannot find a previous HDL lab   Cannot find a previous total cholesterol lab    Assessment & Plan:  Upper respiratory tract infection, unspecified type Assessment & Plan: Azithromycin 250 mg twice daily x 5 days, ipratropium (ATROVENT) 0.03 % nasal spray 3 times daily,Benzonatate 200 mg PRN,  Advise patient to rest to support your body's recovery. Stay hydrated by drinking water, tea, or broth. Using a humidifier can help soothe throat irritation and ease nasal congestion. For fever or pain, acetaminophen (Tylenol)  is recommended. To relieve other symptoms, try saline nasal sprays, throat lozenges, or gargling with saltwater. Focus on eating light, healthy meals like fruits and vegetables to keep your strength up. Practice good hygiene by washing your hands frequently and covering your mouth when coughing or sneezing.Follow-up for worsening or persistent symptoms. Patient verbalizes understanding regarding plan of care and all questions answered       Orders: -     Azithromycin; Take 2 tablets on day 1, then 1 tablet daily on days 2 through 5  Dispense: 6 tablet; Refill: 0 -     Ipratropium Bromide; Place 2 sprays into both nostrils 3 (three) times daily.  Dispense: 15 mL; Refill: 1  Other  orders -     Benzonatate; Take 1 capsule (200 mg total) by mouth 2 (two) times daily as needed for cough.  Dispense: 20 capsule; Refill: 1    Return if symptoms worsen or fail to improve.   Cruzita Lederer Newman Nip, FNP

## 2023-12-08 NOTE — Patient Instructions (Signed)

## 2023-12-14 ENCOUNTER — Other Ambulatory Visit: Payer: Self-pay | Admitting: Family Medicine

## 2023-12-14 MED ORDER — BENZONATATE 200 MG PO CAPS: 200.0000 mg | ORAL_CAPSULE | Freq: Two times a day (BID) | ORAL | 1 refills | Status: AC | PRN

## 2023-12-18 DIAGNOSIS — Z681 Body mass index (BMI) 19 or less, adult: Secondary | ICD-10-CM | POA: Diagnosis not present

## 2023-12-18 DIAGNOSIS — F17210 Nicotine dependence, cigarettes, uncomplicated: Secondary | ICD-10-CM | POA: Diagnosis not present

## 2023-12-18 DIAGNOSIS — M545 Low back pain, unspecified: Secondary | ICD-10-CM | POA: Diagnosis not present

## 2023-12-18 DIAGNOSIS — J449 Chronic obstructive pulmonary disease, unspecified: Secondary | ICD-10-CM | POA: Diagnosis not present

## 2023-12-18 DIAGNOSIS — Z79899 Other long term (current) drug therapy: Secondary | ICD-10-CM | POA: Diagnosis not present

## 2023-12-25 ENCOUNTER — Other Ambulatory Visit: Payer: Self-pay | Admitting: Internal Medicine

## 2023-12-25 DIAGNOSIS — J439 Emphysema, unspecified: Secondary | ICD-10-CM

## 2023-12-27 ENCOUNTER — Ambulatory Visit: Payer: Self-pay | Admitting: Internal Medicine

## 2023-12-27 NOTE — Telephone Encounter (Signed)
Chief Complaint: Thyroid issue Symptoms: Flaky skin, blurry vision Frequency: Constant Pertinent Negatives: Patient denies double vision, confusion, headache, eye pain Disposition: [] ED /[] Urgent Care (no appt availability in office) / [x] Appointment(In office/virtual)/ []  [facility]/ [] Home Care/ [] Refused Recommended Disposition /[] [facility]/ []  Follow-up with PCP Additional Notes: Pt states he has had blurry vision and flaky skin for a couple of years. Pt states he thinks it is related to his thyroid. Pt states he can still see but it is blurry. Pt wears glasses. Pt states his skin is flaking off and he has tried multiple creams. Pt scheduled for an appt with his PCP tmrw. Pt advised to have someone else drive him to appt due to blurry vision. This RN educated pt on home care, new-worsening symptoms, when to call back/seek emergent care. Pt verbalized understanding and agrees to plan.  Copied from CRM [id number]. Topic: Clinical - Red Word Triage >> Dec 27, 2023 10:34 AM Prudencio Pair wrote: Red Word that prompted transfer to Nurse Triage: Patient states he's having blurry vision and flaky skin. States he's been having a thyroid problem and wants to see Dr. Durwin Nora. Reason for Disposition  [1] Blurred vision or visual changes AND [2] gradual onset (e.g., weeks, months)  Answer Assessment - Initial Assessment Questions 1. DESCRIPTION: "How has your vision changed?" (e.g., complete vision loss, blurred vision, double vision, floaters, etc.)     Blurred vision 2. LOCATION: "One or both eyes?" If one, ask: "Which eye?"     Both eyes 3. SEVERITY: "Can you see anything?" If Yes, ask: "What can you see?" (e.g., fine print)     Can see but real blurry, wears glasses 4. ONSET: "When did this begin?" "Did it start suddenly or has this been gradual?"     A couple years ago, getting worse 5. PATTERN: "Does this come and go, or has it been constant since it started?"     Constant 6.  PAIN: "Is there any pain in your eye(s)?"  (Scale 1-10; or mild, moderate, severe)   - NONE (0): No pain.   - MILD (1-3): Doesn't interfere with normal activities.   - MODERATE (4-7): Interferes with normal activities or awakens from sleep.    - SEVERE (8-10): Excruciating pain, unable to do any normal activities.     Denies, eyes get watery and scratchy at times 7. CONTACTS-GLASSES: "Do you wear contacts or glasses?"     Glasses 8. CAUSE: "What do you think is causing this visual problem?"     Thyroid 9. OTHER SYMPTOMS: "Do you have any other symptoms?" (e.g., confusion, headache, arm or leg weakness, speech problems)   Denies  Protocols used: Vision Loss or Change-A-AH

## 2023-12-28 ENCOUNTER — Ambulatory Visit (INDEPENDENT_AMBULATORY_CARE_PROVIDER_SITE_OTHER): Payer: Self-pay | Admitting: Internal Medicine

## 2023-12-28 ENCOUNTER — Encounter: Payer: Self-pay | Admitting: Internal Medicine

## 2023-12-28 VITALS — BP 94/68 | HR 97 | Ht 70.0 in | Wt 129.0 lb

## 2023-12-28 DIAGNOSIS — Z131 Encounter for screening for diabetes mellitus: Secondary | ICD-10-CM

## 2023-12-28 DIAGNOSIS — L853 Xerosis cutis: Secondary | ICD-10-CM | POA: Diagnosis not present

## 2023-12-28 DIAGNOSIS — E782 Mixed hyperlipidemia: Secondary | ICD-10-CM

## 2023-12-28 DIAGNOSIS — Z720 Tobacco use: Secondary | ICD-10-CM

## 2023-12-28 DIAGNOSIS — Z1159 Encounter for screening for other viral diseases: Secondary | ICD-10-CM

## 2023-12-28 DIAGNOSIS — Z1321 Encounter for screening for nutritional disorder: Secondary | ICD-10-CM | POA: Diagnosis not present

## 2023-12-28 DIAGNOSIS — Z8639 Personal history of other endocrine, nutritional and metabolic disease: Secondary | ICD-10-CM | POA: Diagnosis not present

## 2023-12-28 NOTE — Assessment & Plan Note (Signed)
Presenting today for an acute visit endorsing a 2-year history of dry, flaking skin.  He has tried applying multiple moisturizing creams and Vaseline without improvement.  On exam there are patches of dried, flaking skin noted diffusely.  No evidence of significant excoriation or erythema. Will place referral to dermatology at patient's request given failure of multiple topical creams.

## 2023-12-28 NOTE — Patient Instructions (Signed)
It was a pleasure to see you today.  Thank you for giving [place] the opportunity to be involved in your care.  Below is a brief recap of your visit and next steps.  We will plan to see you again on 3/19.  Summary Dermatology referral placed today Basic labs ordered Return for previously scheduled follow up next month (3/19)

## 2023-12-28 NOTE — Assessment & Plan Note (Addendum)
Documented history of hypothyroidism.  He states that he has been off levothyroxine for at least 5 years.  He is concerned that his skin issues are attributable to hypothyroidism.  Will check thyroid studies today.

## 2023-12-28 NOTE — Progress Notes (Signed)
   Acute Office Visit  Subjective:     Patient ID: Jaime [name], male    DOB: 1956/09/10, 68 y.o.   MRN: [id number]  Chief Complaint  Patient presents with   Blurred Vision    Blurry vision    flaky skin    Flaky skin, wants a referral to a dermatologist    Mr. Stradling presents today for an acute visit for evaluation of dried, flaking skin that has been present for at least 2 years.  He states that he has had dried skin diffusely that will impair his vision when he rubs his eyes.  He has concern for hypothyroidism.  He endorses a history of hypothyroidism and was prescribed levothyroxine but has not taken it within the last 5 years.  He has tried multiple moisturizing creams and Vaseline without success.  He would like to see a dermatologist.  Review of Systems  Eyes:  Positive for blurred vision (he atrributes to dried skin falling into his eyes).  Skin:        Diffuse, dried, flaky skin        Objective:    BP 94/68 (BP Location: Left Arm, Patient Position: Sitting, Cuff Size: Normal)   Pulse 97   Ht 5\' 10"  (1.778 m)   Wt 129 lb (58.5 kg)   SpO2 97%   BMI 18.51 kg/m   Physical Exam Vitals reviewed.  Constitutional:      Appearance: Normal appearance.  Skin:    General: Skin is warm and dry.     Comments: Dried skin noted diffusely with flaking skin noted after raising his shirt. No evidence of erythema or excoriation noted.   Neurological:     Mental Status: He is alert.       Assessment & Plan:   Problem List Items Addressed This Visit       Dry skin   Presenting today for an acute visit endorsing a 2-year history of dry, flaking skin.  He has tried applying multiple moisturizing creams and Vaseline without improvement.  On exam there are patches of dried, flaking skin noted diffusely.  No evidence of significant excoriation or erythema. Will place referral to dermatology at patient's request given failure of multiple topical creams.      History of  hypothyroidism   Documented history of hypothyroidism.  He states that he has been off levothyroxine for at least 5 years.  He is concerned that his skin issues are attributable to hypothyroidism.  Will check thyroid studies today.      Return if symptoms worsen or fail to improve.  Billie Lade, MD

## 2023-12-29 LAB — CMP14+EGFR
ALT: 10 [IU]/L (ref 0–44)
AST: 17 [IU]/L (ref 0–40)
Albumin: 4.2 g/dL (ref 3.9–4.9)
Alkaline Phosphatase: 140 [IU]/L — ABNORMAL HIGH (ref 44–121)
BUN/Creatinine Ratio: 16 (ref 10–24)
BUN: 11 mg/dL (ref 8–27)
Bilirubin Total: 0.3 mg/dL (ref 0.0–1.2)
CO2: 27 mmol/L (ref 20–29)
Calcium: 9.9 mg/dL (ref 8.6–10.2)
Chloride: 97 mmol/L (ref 96–106)
Creatinine, Ser: 0.69 mg/dL — ABNORMAL LOW (ref 0.76–1.27)
Globulin, Total: 3.3 g/dL (ref 1.5–4.5)
Glucose: 79 mg/dL (ref 70–99)
Potassium: 4.5 mmol/L (ref 3.5–5.2)
Sodium: 137 mmol/L (ref 134–144)
Total Protein: 7.5 g/dL (ref 6.0–8.5)
eGFR: 100 mL/min/{1.73_m2} (ref >59)

## 2023-12-29 LAB — LIPID PANEL
Chol/HDL Ratio: 3.5 {ratio} (ref 0.0–5.0)
Cholesterol, Total: 143 mg/dL (ref 100–199)
HDL: 41 mg/dL (ref >39)
LDL Chol Calc (NIH): 67 mg/dL (ref 0–99)
Triglycerides: 212 mg/dL — ABNORMAL HIGH (ref 0–149)
VLDL Cholesterol Cal: 35 mg/dL (ref 5–40)

## 2023-12-29 LAB — B12 AND FOLATE PANEL
Folate: 3.9 ng/mL (ref >3.0)
Vitamin B-12: 701 pg/mL (ref 232–1245)

## 2023-12-29 LAB — CBC WITH DIFFERENTIAL/PLATELET
Basophils Absolute: 0.1 10*3/uL (ref 0.0–0.2)
Basos: 1 %
EOS (ABSOLUTE): 0.1 10*3/uL (ref 0.0–0.4)
Eos: 1 %
Hematocrit: 40.2 % (ref 37.5–51.0)
Hemoglobin: 13.6 g/dL (ref 13.0–17.7)
Immature Grans (Abs): 0 10*3/uL (ref 0.0–0.1)
Immature Granulocytes: 0 %
Lymphocytes Absolute: 2.1 10*3/uL (ref 0.7–3.1)
Lymphs: 22 %
MCH: 31.6 pg (ref 26.6–33.0)
MCHC: 33.8 g/dL (ref 31.5–35.7)
MCV: 94 fL (ref 79–97)
Monocytes Absolute: 0.7 10*3/uL (ref 0.1–0.9)
Monocytes: 7 %
Neutrophils Absolute: 6.8 10*3/uL (ref 1.4–7.0)
Neutrophils: 69 %
Platelets: 274 10*3/uL (ref 150–450)
RBC: 4.3 x10E6/uL (ref 4.14–5.80)
RDW: 12.5 % (ref 11.6–15.4)
WBC: 9.7 10*3/uL (ref 3.4–10.8)

## 2023-12-29 LAB — TSH+FREE T4
Free T4: 0.83 ng/dL (ref 0.82–1.77)
TSH: 4.31 u[IU]/mL (ref 0.450–4.500)

## 2023-12-29 LAB — HEMOGLOBIN A1C
Est. average glucose Bld gHb Est-mCnc: 120 mg/dL
Hgb A1c MFr Bld: 5.8 % — ABNORMAL HIGH (ref 4.8–5.6)

## 2023-12-29 LAB — VITAMIN D 25 HYDROXY (VIT D DEFICIENCY, FRACTURES): Vit D, 25-Hydroxy: 89.5 ng/mL (ref 30.0–100.0)

## 2023-12-29 LAB — HCV INTERPRETATION

## 2023-12-29 LAB — HCV AB W REFLEX TO QUANT PCR: HCV Ab: NONREACTIVE

## 2024-01-15 DIAGNOSIS — M545 Low back pain, unspecified: Secondary | ICD-10-CM | POA: Diagnosis not present

## 2024-01-15 DIAGNOSIS — Z681 Body mass index (BMI) 19 or less, adult: Secondary | ICD-10-CM | POA: Diagnosis not present

## 2024-01-15 DIAGNOSIS — J449 Chronic obstructive pulmonary disease, unspecified: Secondary | ICD-10-CM | POA: Diagnosis not present

## 2024-01-15 DIAGNOSIS — Z79899 Other long term (current) drug therapy: Secondary | ICD-10-CM | POA: Diagnosis not present

## 2024-01-15 DIAGNOSIS — F17210 Nicotine dependence, cigarettes, uncomplicated: Secondary | ICD-10-CM | POA: Diagnosis not present

## 2024-01-16 ENCOUNTER — Ambulatory Visit (INDEPENDENT_AMBULATORY_CARE_PROVIDER_SITE_OTHER): Payer: 59

## 2024-01-16 VITALS — Ht 70.25 in | Wt 128.0 lb

## 2024-01-16 DIAGNOSIS — Z0000 Encounter for general adult medical examination without abnormal findings: Secondary | ICD-10-CM

## 2024-01-16 NOTE — Patient Instructions (Signed)
 Jaime [name] , Thank you for taking time to come for your Medicare Wellness Visit. I appreciate your ongoing commitment to your health goals. Please review the following plan we discussed and let me know if I can assist you in the future.   Referrals/Orders/Follow-Ups/Clinician Recommendations:   Next Medicare Annual Wellness Visit:  January 20, 2025 at 13:30 pm telephone visit.   This is a list of the screening recommended for you and due dates:  Health Maintenance  Topic Date Due   COVID-19 Vaccine (1) Never done   DTaP/Tdap/Td vaccine (1 - Tdap) Never done   Zoster (Shingles) Vaccine (1 of 2) Never done   Pneumonia Vaccine (2 of 2 - PCV) 06/09/2018   Flu Shot  02/05/2024*   Screening for Lung Cancer  10/24/2024   Medicare Annual Wellness Visit  01/15/2025   Colon Cancer Screening  07/26/2033   Hepatitis C Screening  Completed   HPV Vaccine  Aged Out  *Topic was postponed. The date shown is not the original due date.    Advanced directives: (Declined) Advance directive discussed with you today. Even though you declined this today, please call our office should you change your mind, and we can give you the proper paperwork for you to fill out.  Next Medicare Annual Wellness Visit scheduled for next year: yes  Understanding Your Risk for Falls Millions of people have serious injuries from falls each year. It is important to understand your risk of falling. Talk with your health care provider about your risk and what you can do to lower it. If you do have a serious fall, make sure to tell your provider. Falling once raises your risk of falling again. How can falls affect me? Serious injuries from falls are common. These include: Broken bones, such as hip fractures. Head injuries, such as traumatic brain injuries (TBI) or concussions. A fear of falling can cause you to avoid activities and stay at home. This can make your muscles weaker and raise your risk for a fall. What can increase my  risk? There are a number of risk factors that increase your risk for falling. The more risk factors you have, the higher your risk of falling. Serious injuries from a fall happen most often to people who are older than 70 years old. Teenagers and young adults ages 89-29 are also at higher risk. Common risk factors include: Weakness in the lower body. Being generally weak or confused due to long-term (chronic) illness. Dizziness or balance problems. Poor vision. Medicines that cause dizziness or drowsiness. These may include: Medicines for your blood pressure, heart, anxiety, insomnia, or swelling (edema). Pain medicines. Muscle relaxants. Other risk factors include: Drinking alcohol. Having had a fall in the past. Having foot pain or wearing improper footwear. Working at a dangerous job. Having any of the following in your home: Tripping hazards, such as floor clutter or loose rugs. Poor lighting. Pets. Having dementia or memory loss. What actions can I take to lower my risk of falling?     Physical activity Stay physically fit. Do strength and balance exercises. Consider taking a regular class to build strength and balance. Yoga and tai chi are good options. Vision Have your eyes checked every year and your prescription for glasses or contacts updated as needed. Shoes and walking aids Wear non-skid shoes. Wear shoes that have rubber soles and low heels. Do not wear high heels. Do not walk around the house in socks or slippers. Use a cane or walker  as told by your provider. Home safety Attach secure railings on both sides of your stairs. Install grab bars for your bathtub, shower, and toilet. Use a non-skid mat in your bathtub or shower. Attach bath mats securely with double-sided, non-slip rug tape. Use good lighting in all rooms. Keep a flashlight near your bed. Make sure there is a clear path from your bed to the bathroom. Use night-lights. Do not use throw rugs. Make sure  all carpeting is taped or tacked down securely. Remove all clutter from walkways and stairways, including extension cords. Repair uneven or broken steps and floors. Avoid walking on icy or slippery surfaces. Walk on the grass instead of on icy or slick sidewalks. Use ice melter to get rid of ice on walkways in the winter. Use a cordless phone. Questions to ask your health care provider Can you help me check my risk for a fall? Do any of my medicines make me more likely to fall? Should I take a vitamin D supplement? What exercises can I do to improve my strength and balance? Should I make an appointment to have my vision checked? Do I need a bone density test to check for weak bones (osteoporosis)? Would it help to use a cane or a walker? Where to find more information Centers for Disease Control and Prevention, STEADI: TonerPromos.no Community-Based Fall Prevention Programs: TonerPromos.no [employer] on Aging: BaseRingTones.pl Contact a health care provider if: You fall at home. You are afraid of falling at home. You feel weak, drowsy, or dizzy. This information is not intended to replace advice given to you by your health care provider. Make sure you discuss any questions you have with your health care provider. Document Revised: 06/27/2022 Document Reviewed: 06/27/2022 Elsevier Patient Education  2024 Elsevier Inc.   Managing Pain Without Opioids Opioids are strong medicines used to treat moderate to severe pain. For some people, especially those who have long-term (chronic) pain, opioids may not be the best choice for pain management due to: Side effects like nausea, constipation, and sleepiness. The risk of addiction (opioid use disorder). The longer you take opioids, the greater your risk of addiction. Pain that lasts for more than 3 months is called chronic pain. Managing chronic pain usually requires more than one approach and is often provided by a team of health care providers working  together (multidisciplinary approach). Pain management may be done at a pain management center or pain clinic. How to manage pain without the use of opioids Use non-opioid medicines Non-opioid medicines for pain may include: Over-the-counter or prescription non-steroidal anti-inflammatory drugs (NSAIDs). These may be the first medicines used for pain. They work well for muscle and bone pain, and they reduce swelling. Acetaminophen. This over-the-counter medicine may work well for milder pain but not swelling. Antidepressants. These may be used to treat chronic pain. A certain type of antidepressant (tricyclics) is often used. These medicines are given in lower doses for pain than when used for depression. Anticonvulsants. These are usually used to treat seizures but may also reduce nerve (neuropathic) pain. Muscle relaxants. These relieve pain caused by sudden muscle tightening (spasms). You may also use a pain medicine that is applied to the skin as a patch, cream, or gel (topical analgesic), such as a numbing medicine. These may cause fewer side effects than medicines taken by mouth. Do certain therapies as directed Some therapies can help with pain management. They include: Physical therapy. You will do exercises to gain strength and flexibility. A physical  therapist may teach you exercises to move and stretch parts of your body that are weak, stiff, or painful. You can learn these exercises at physical therapy visits and practice them at home. Physical therapy may also involve: Massage. Heat wraps or applying heat or cold to affected areas. Electrical signals that interrupt pain signals (transcutaneous electrical nerve stimulation, TENS). Weak lasers that reduce pain and swelling (low-level laser therapy). Signals from your body that help you learn to regulate pain (biofeedback). Occupational therapy. This helps you to learn ways to function at home and work with less pain. Recreational  therapy. This involves trying new activities or hobbies, such as a physical activity or drawing. Mental health therapy, including: Cognitive behavioral therapy (CBT). This helps you learn coping skills for dealing with pain. Acceptance and commitment therapy (ACT) to change the way you think and react to pain. Relaxation therapies, including muscle relaxation exercises and mindfulness-based stress reduction. Pain management counseling. This may be individual, family, or group counseling.  Receive medical treatments Medical treatments for pain management include: Nerve block injections. These may include a pain blocker and anti-inflammatory medicines. You may have injections: Near the spine to relieve chronic back or neck pain. Into joints to relieve back or joint pain. Into nerve areas that supply a painful area to relieve body pain. Into muscles (trigger point injections) to relieve some painful muscle conditions. A medical device placed near your spine to help block pain signals and relieve nerve pain or chronic back pain (spinal cord stimulation device). Acupuncture. Follow these instructions at home Medicines Take over-the-counter and prescription medicines only as told by your health care provider. If you are taking pain medicine, ask your health care providers about possible side effects to watch out for. Do not drive or use heavy machinery while taking prescription opioid pain medicine. Lifestyle  Do not use drugs or alcohol to reduce pain. If you drink alcohol, limit how much you have to: 0-1 drink a day for women who are not pregnant. 0-2 drinks a day for men. Know how much alcohol is in a drink. In the [place] one drink equals one 12 oz bottle of beer (355 mL), one 5 oz glass of wine (148 mL), or one 1 oz glass of hard liquor (44 mL). Do not use any products that contain nicotine or tobacco. These products include cigarettes, chewing tobacco, and vaping devices, such as  e-cigarettes. If you need help quitting, ask your health care provider. Eat a healthy diet and maintain a healthy weight. Poor diet and excess weight may make pain worse. Eat foods that are high in fiber. These include fresh fruits and vegetables, whole grains, and beans. Limit foods that are high in fat and processed sugars, such as fried and sweet foods. Exercise regularly. Exercise lowers stress and may help relieve pain. Ask your health care provider what activities and exercises are safe for you. If your health care provider approves, join an exercise class that combines movement and stress reduction. Examples include yoga and tai chi. Get enough sleep. Lack of sleep may make pain worse. Lower stress as much as possible. Practice stress reduction techniques as told by your therapist. General instructions Work with all your pain management providers to find the treatments that work best for you. You are an important member of your pain management team. There are many things you can do to reduce pain on your own. Consider joining an online or in-person support group for people who have chronic pain. Keep  all follow-up visits. This is important. Where to find more information You can find more information about managing pain without opioids from: American Academy of Pain Medicine: painmed.org Institute for Chronic Pain: instituteforchronicpain.org American Chronic Pain Association: theacpa.org Contact a health care provider if: You have side effects from pain medicine. Your pain gets worse or does not get better with treatments or home therapy. You are struggling with anxiety or depression. Summary Many types of pain can be managed without opioids. Chronic pain may respond better to pain management without opioids. Pain is best managed when you and a team of health care providers work together. Pain management without opioids may include non-opioid medicines, medical treatments, physical  therapy, mental health therapy, and lifestyle changes. Tell your health care providers if your pain gets worse or is not being managed well enough. This information is not intended to replace advice given to you by your health care provider. Make sure you discuss any questions you have with your health care provider. Document Revised: 02/03/2021 Document Reviewed: 02/03/2021 Elsevier Patient Education  2024 [employer].

## 2024-01-16 NOTE — Progress Notes (Signed)
 Because this visit was a virtual/telehealth visit,  certain criteria was not obtained, such a blood pressure, CBG if applicable, and timed get up and go. Any medications not marked as "taking" were not mentioned during the medication reconciliation part of the visit. Any vitals not documented were not able to be obtained due to this being a telehealth visit or patient was unable to self-report a recent blood pressure reading due to a lack of equipment at home via telehealth. Vitals that have been documented are verbally provided by the patient.   Subjective:   Jaime [name] is a 68 y.o. who presents for a Medicare Wellness preventive visit.  Visit Complete: Virtual I connected with  Takashi R Toth on 01/16/24 by a audio enabled telemedicine application and verified that I am speaking with the correct person using two identifiers.  Patient Location: Home  Provider Location: Home Office  I discussed the limitations of evaluation and management by telemedicine. The patient expressed understanding and agreed to proceed.  Vital Signs: Because this visit was a virtual/telehealth visit, some criteria may be missing or patient reported. Any vitals not documented were not able to be obtained and vitals that have been documented are patient reported.  VideoDeclined- This patient declined Librarian, academic. Therefore the visit was completed with audio only.  AWV Questionnaire: No: Patient Medicare AWV questionnaire was not completed prior to this visit.  Cardiac Risk Factors include: advanced age (>33men, >57 women);smoking/ tobacco exposure     Objective:    Today's Vitals   01/16/24 1314  Weight: 128 lb (58.1 kg)  Height: 5' 10.25" (1.784 m)  PainSc: 7    Body mass index is 18.24 kg/m.     01/16/2024    1:17 PM 07/27/2023    7:54 AM 11/23/2019    6:57 PM 06/08/2018   10:01 AM 08/07/2017   12:25 PM 07/01/2017   11:23 AM 06/19/2017    7:14 PM  Advanced Directives   Does Patient Have a Medical Advance Directive? No No No No No No No  Would patient like information on creating a medical advance directive? No - Patient declined No - Patient declined No - Patient declined No - Patient declined       Current Medications (verified) Outpatient Encounter Medications as of 01/16/2024  Medication Sig   albuterol (VENTOLIN HFA) 108 (90 Base) MCG/ACT inhaler Inhale 2 puffs into the lungs every 6 (six) hours as needed.   atorvastatin (LIPITOR) 10 MG tablet Take 1 tablet (10 mg total) by mouth daily.   gabapentin (NEURONTIN) 400 MG capsule Take 400 mg by mouth 2 (two) times daily.   HYDROcodone-acetaminophen (NORCO) 10-325 MG per tablet Take 1 tablet by mouth 4 (four) times daily.   ipratropium (ATROVENT) 0.06 % nasal spray Place 2 sprays into both nostrils 3 (three) times daily.   nystatin (MYCOSTATIN/NYSTOP) powder Apply 1 Application topically 3 (three) times daily.   TRELEGY ELLIPTA 200-62.5-25 MCG/ACT AEPB INHALE 1 PUFF INTO THE LUNGS DAILY   valACYclovir (VALTREX) 1000 MG tablet TAKE 1 TABLET BY MOUTH DAILY   azithromycin (ZITHROMAX) 250 MG tablet Take 2 tablets on day 1, then 1 tablet daily on days 2 through 5 (Patient not taking: Reported on 01/16/2024)   benzonatate (TESSALON) 200 MG capsule Take 1 capsule (200 mg total) by mouth 2 (two) times daily as needed for cough. (Patient not taking: Reported on 01/16/2024)   No facility-administered encounter medications on file as of 01/16/2024.    Allergies (  verified) Celebrex [celecoxib], Ciprofloxacin, Contrast media [iodinated contrast media], Dilaudid [hydromorphone], Latex, Morphine and codeine, and Sulfa antibiotics   History: Past Medical History:  Diagnosis Date   Anxiety    Arthritis    BPH (benign prostatic hyperplasia)    BPH (benign prostatic hyperplasia) 06/08/2017   Chest pain    Chronic pain    neck   Constipation    Genital herpes    Hypothyroidism    Neuropathy    Paralysis, unspecified     Upper extremity weakness s/p severe neck injury   Tobacco abuse    Past Surgical History:  Procedure Laterality Date   CHOLECYSTECTOMY  1982   chordoma  1996   COLONOSCOPY  2001   [facility] at [facility]   COLONOSCOPY  05/30/2012   Dr. Jena Gauss: tubular adenomas removed. next TCS in 5 years.    COLONOSCOPY WITH PROPOFOL N/A 06/14/2018   Procedure: COLONOSCOPY WITH PROPOFOL;  Surgeon: Corbin Ade, MD;  [facility];  Service: Endoscopy;  Laterality: N/A;  2:15pm-moved to 11:15 per Mindy   FLEXIBLE SIGMOIDOSCOPY N/A 07/27/2023   Procedure: FLEXIBLE SIGMOIDOSCOPY;  Surgeon: Corbin Ade, MD;  [facility];  Service: Endoscopy;  Laterality: N/A;   MASTOIDECTOMY  1991   NECK SURGERY  2009   fracture C3-7   Family History  Problem Relation Age of Onset   Hypertension Mother    Heart disease Father    Lung cancer Father    Heart disease Sister    Lung cancer Brother    Colon cancer Neg Hx    Social History   Socioeconomic History   Marital status: Divorced    Spouse name: Not on file   Number of children: 4   Years of education: Not on file   Highest education level: Not on file  Occupational History   Occupation: disabled  Tobacco Use   Smoking status: Every Day    Current packs/day: 1.50    Average packs/day: 1.5 packs/day for 53.2 years (79.8 ttl pk-yrs)    Types: Cigarettes    Start date: 34   Smokeless tobacco: Never  Vaping Use   Vaping status: Not on file  Substance and Sexual Activity   Alcohol use: No    Comment: quit about 4 years ago; denied 04/05/18   Drug use: No   Sexual activity: Not Currently    Birth control/protection: None  Other Topics Concern   Not on file  Social History Narrative   Lives w/ significant other   Social Drivers of Health   Financial Resource Strain: Low Risk  (01/16/2024)   Overall Financial Resource Strain (CARDIA)    Difficulty of Paying Living Expenses: Not hard at all  Food Insecurity: No  Food Insecurity (01/16/2024)   Hunger Vital Sign    Worried About Running Out of Food in the Last Year: Never true    Ran Out of Food in the Last Year: Never true  Transportation Needs: No Transportation Needs (01/16/2024)   PRAPARE - Administrator, Civil Service (Medical): No    Lack of Transportation (Non-Medical): No  Physical Activity: Sufficiently Active (01/16/2024)   Exercise Vital Sign    Days of Exercise per Week: 7 days    Minutes of Exercise per Session: 30 min  Stress: No Stress Concern Present (01/16/2024)   [employer] of Occupational Health - Occupational Stress Questionnaire    Feeling of Stress : Only a little  Social Connections: Moderately  Integrated (01/16/2024)   Social Connection and Isolation Panel [NHANES]    Frequency of Communication with Friends and Family: More than three times a week    Frequency of Social Gatherings with Friends and Family: More than three times a week    Attends Religious Services: More than 4 times per year    Active Member of [employer] or Organizations: Yes    Attends Engineer, structural: More than 4 times per year    Marital Status: Divorced    Tobacco Counseling Ready to quit: No Counseling given: Yes    Clinical Intake:  Pre-visit preparation completed: Yes  Pain : 0-10 Pain Score: 7  Pain Type: Chronic pain Pain Location: Neck Pain Orientation: Posterior Pain Descriptors / Indicators: Burning, Tingling, Constant Pain Onset: More than a month ago Pain Frequency: Constant     BMI - recorded: 18.24 Nutritional Status: BMI <19  Underweight Nutritional Risks: None Diabetes: No  How often do you need to have someone help you when you read instructions, pamphlets, or other written materials from your doctor or pharmacy?: 5 - Always What is the last grade level you completed in school?: 8th  Interpreter Needed?: No  Information entered by :: Maryjean Ka CMA   Activities of Daily Living      01/16/2024    1:34 PM  In your present state of health, do you have any difficulty performing the following activities:  Hearing? 1  Comment wears hearing aides  Vision? 0  Difficulty concentrating or making decisions? 0  Walking or climbing stairs? 0  Dressing or bathing? 0  Doing errands, shopping? 0  Preparing Food and eating ? Y  Comment mother cooks for him  Using the Toilet? N  In the past six months, have you accidently leaked urine? N  Do you have problems with loss of bowel control? N  Managing your Medications? N  Managing your Finances? N  Housekeeping or managing your Housekeeping? N    Patient Care Team: Billie Lade, MD as PCP - General (Internal Medicine) Jena Gauss Gerrit Friends, MD (Gastroenterology) Frederich Chick., MD as Referring Physician (Family Medicine) Daisy Lazar, DO (Optometry)  Indicate any recent Medical Services you may have received from other than [facility] providers in the past year (date may be approximate).     Assessment:   This is a routine wellness examination for Swade.  Hearing/Vision screen Hearing Screening - Comments:: Patient wears hearing aids. Patient sees [facility] in [place] for yearly exams.  Vision Screening - Comments:: Wears rx glasses - up to date with routine eye exams  Patient sees Dr. Daisy Lazar w/ [facility] [place] office.     Goals Addressed             This Visit's Progress    Patient Stated       To have less pain        Depression Screen     01/16/2024    1:24 PM 12/28/2023    1:34 PM 10/24/2023    2:39 PM  PHQ 2/9 Scores  PHQ - 2 Score 1 1 1   PHQ- 9 Score 7  4    Fall Risk     01/16/2024    1:19 PM 12/28/2023    1:34 PM 12/08/2023   11:22 AM 10/24/2023    2:38 PM  Fall Risk   Falls in the past year? 1 1 1  0  Number falls in past yr: 1 0 0 0  Injury with Fall? 1 0 0 0  Risk for fall due to : No Fall Risks Other (Comment) Other (Comment) No Fall Risks  Risk for fall due to: Comment   age age   Follow up Falls prevention discussed;Falls evaluation completed Falls evaluation completed;Education provided;Falls prevention discussed  Falls evaluation completed    MEDICARE RISK AT HOME:  Medicare Risk at Home Any stairs in or around the home?: Yes If so, are there any without handrails?: No Home free of loose throw rugs in walkways, pet beds, electrical cords, etc?: Yes Adequate lighting in your home to reduce risk of falls?: Yes Life alert?: No Use of a cane, walker or w/c?: Yes Grab bars in the bathroom?: Yes Shower chair or bench in shower?: Yes Elevated toilet seat or a handicapped toilet?: Yes  TIMED UP AND GO:  Was the test performed?  No  Cognitive Function: patient declined to complete    01/16/2024    1:38 PM  MMSE - Mini Mental State Exam  Not completed: Refused        Immunizations Immunization History  Administered Date(s) Administered   Influenza-Unspecified 09/07/2018   Pneumococcal Polysaccharide-23 06/09/2017    Screening Tests Health Maintenance  Topic Date Due   COVID-19 Vaccine (1) Never done   DTaP/Tdap/Td (1 - Tdap) Never done   Zoster Vaccines- Shingrix (1 of 2) Never done   Pneumonia Vaccine 58+ Years old (2 of 2 - PCV) 06/09/2018   INFLUENZA VACCINE  02/05/2024 (Originally 06/08/2023)   Lung Cancer Screening  10/24/2024   Medicare Annual Wellness (AWV)  01/15/2025   Colonoscopy  07/26/2033   Hepatitis C Screening  Completed   HPV VACCINES  Aged Out    Health Maintenance  Health Maintenance Due  Topic Date Due   COVID-19 Vaccine (1) Never done   DTaP/Tdap/Td (1 - Tdap) Never done   Zoster Vaccines- Shingrix (1 of 2) Never done   Pneumonia Vaccine 47+ Years old (2 of 2 - PCV) 06/09/2018   Health Maintenance Items Addressed: Patient declined repeat colonoscopy as recommended by Dr. Jena Gauss due to poor prep of previous attempt  Additional Screening:  Vision Screening: Recommended annual ophthalmology exams for early  detection of glaucoma and other disorders of the eye.  Dental Screening: Recommended annual dental exams for proper oral hygiene  Community Resource Referral / Chronic Care Management: CRR required this visit?  No   CCM required this visit?  No     Plan:     I have personally reviewed and noted the following in the patient's chart:   Medical and social history Use of alcohol, tobacco or illicit drugs  Current medications and supplements including opioid prescriptions. Patient is currently taking opioid prescriptions. Information provided to patient regarding non-opioid alternatives. Patient advised to discuss non-opioid treatment plan with their provider. Functional ability and status Nutritional status Physical activity Advanced directives List of other physicians Hospitalizations, surgeries, and ER visits in previous 12 months Vitals Screenings to include cognitive, depression, and falls Referrals and appointments  In addition, I have reviewed and discussed with patient certain preventive protocols, quality metrics, and best practice recommendations. A written personalized care plan for preventive services as well as general preventive health recommendations were provided to patient.     Jordan Hawks Robbin Loughmiller, CMA   01/16/2024   After Visit Summary: (Mail) Due to this being a telephonic visit, the after visit summary with patients personalized plan was offered to patient via mail   Notes: Nothing significant  to report at this time.

## 2024-01-18 DIAGNOSIS — Z79899 Other long term (current) drug therapy: Secondary | ICD-10-CM | POA: Diagnosis not present

## 2024-01-24 ENCOUNTER — Ambulatory Visit (INDEPENDENT_AMBULATORY_CARE_PROVIDER_SITE_OTHER): Payer: 59 | Admitting: Internal Medicine

## 2024-01-24 ENCOUNTER — Encounter: Payer: Self-pay | Admitting: Internal Medicine

## 2024-01-24 VITALS — BP 100/66 | HR 93 | Ht 70.0 in | Wt 129.0 lb

## 2024-01-24 DIAGNOSIS — R7303 Prediabetes: Secondary | ICD-10-CM | POA: Diagnosis not present

## 2024-01-24 DIAGNOSIS — J439 Emphysema, unspecified: Secondary | ICD-10-CM

## 2024-01-24 DIAGNOSIS — R748 Abnormal levels of other serum enzymes: Secondary | ICD-10-CM

## 2024-01-24 DIAGNOSIS — J984 Other disorders of lung: Secondary | ICD-10-CM | POA: Diagnosis not present

## 2024-01-24 DIAGNOSIS — E782 Mixed hyperlipidemia: Secondary | ICD-10-CM

## 2024-01-24 NOTE — Patient Instructions (Signed)
 It was a pleasure to see you today.  Thank you for giving [place] the opportunity to be involved in your care.  Below is a brief recap of your visit and next steps.  We will plan to see you again in 3 months.  Summary No medication changes today Repeat labs ordered Will follow up with pulmonology Follow up in 3 months

## 2024-01-24 NOTE — Progress Notes (Unsigned)
 Established Patient Office Visit  Subjective   Patient ID: Jaime [name], male    DOB: 28-Jul-1956  Age: 68 y.o. MRN: [id number]  Chief Complaint  Patient presents with   Care Management    Pt. Just requests [employer] paper work to be filled out states he has done his part.    Jaime [name] returns to care today for routine follow-up.  He was last evaluated by me on 2/20 in the setting of dry, flaking skin.  Dermatology referral placed at patient's request.  Previously evaluated by me in December 2024 as a new patient presenting to establish care.  CT chest ordered for follow-up of cavitary lesion noted on previous imaging.  No medication changes were made and 76-month follow-up was arranged.  Past Medical History:  Diagnosis Date   Anxiety    Arthritis    BPH (benign prostatic hyperplasia)    BPH (benign prostatic hyperplasia) 06/08/2017   Chest pain    Chronic pain    neck   Constipation    Genital herpes    Hypothyroidism    Neuropathy    Paralysis, unspecified    Upper extremity weakness s/p severe neck injury   Tobacco abuse    Past Surgical History:  Procedure Laterality Date   CHOLECYSTECTOMY  1982   chordoma  1996   COLONOSCOPY  2001   pt reports normal at [facility]   COLONOSCOPY  05/30/2012   Dr. Jena Gauss: tubular adenomas removed. next TCS in 5 years.    COLONOSCOPY WITH PROPOFOL N/A 06/14/2018   Procedure: COLONOSCOPY WITH PROPOFOL;  Surgeon: Corbin Ade, MD;  [facility];  Service: Endoscopy;  Laterality: N/A;  2:15pm-moved to 11:15 per Mindy   FLEXIBLE SIGMOIDOSCOPY N/A 07/27/2023   Procedure: FLEXIBLE SIGMOIDOSCOPY;  Surgeon: Corbin Ade, MD;  [facility];  Service: Endoscopy;  Laterality: N/A;   MASTOIDECTOMY  1991   NECK SURGERY  2009   fracture C3-7   Social History   Tobacco Use   Smoking status: Every Day    Current packs/day: 1.50    Average packs/day: 1.5 packs/day for 53.2 years (79.8 ttl pk-yrs)    Types: Cigarettes    Start  date: 1972   Smokeless tobacco: Never  Substance Use Topics   Alcohol use: No    Comment: quit about 4 years ago; denied 04/05/18   Drug use: No   Family History  Problem Relation Age of Onset   Hypertension Mother    Heart disease Father    Lung cancer Father    Heart disease Sister    Lung cancer Brother    Colon cancer Neg Hx    Allergies  Allergen Reactions   Celebrex [Celecoxib] Itching    Hot Flashes   Ciprofloxacin Hives   Contrast Media [Iodinated Contrast Media]     Hypotension   Dilaudid [Hydromorphone] Other (See Comments)    hallunications   Latex Hives   Morphine And Codeine     Panic Attack   Sulfa Antibiotics Other (See Comments)    Hot Flashes   Review of Systems  Constitutional:  Negative for chills and fever.  HENT:  Negative for sore throat.   Respiratory:  Negative for cough and shortness of breath.   Cardiovascular:  Negative for chest pain, palpitations and leg swelling.  Gastrointestinal:  Negative for abdominal pain, blood in stool, constipation, diarrhea, nausea and vomiting.  Genitourinary:  Negative for dysuria and hematuria.  Musculoskeletal:  Negative for myalgias.  Skin:  Negative for itching and rash.  Neurological:  Negative for dizziness and headaches.  Psychiatric/Behavioral:  Negative for depression and suicidal ideas.       Objective:     BP 100/66 (BP Location: Left Arm, Patient Position: Sitting, Cuff Size: Normal)   Pulse 93   Ht 5\' 10"  (1.778 m)   Wt 129 lb (58.5 kg)   SpO2 96%   BMI 18.51 kg/m  BP Readings from Last 3 Encounters:  01/24/24 100/66  12/28/23 94/68  12/08/23 109/69   Physical Exam Vitals reviewed.  Constitutional:      General: He is not in acute distress.    Appearance: Normal appearance. He is not ill-appearing.  HENT:     Head: Normocephalic and atraumatic.     Right Ear: External ear normal.     Left Ear: External ear normal.     Nose: Nose normal. No congestion or rhinorrhea.      Mouth/Throat:     Mouth: Mucous membranes are moist.     Pharynx: Oropharynx is clear.  Eyes:     General: No scleral icterus.    Extraocular Movements: Extraocular movements intact.     Conjunctiva/sclera: Conjunctivae normal.     Pupils: Pupils are equal, round, and reactive to light.  Cardiovascular:     Rate and Rhythm: Normal rate and regular rhythm.     Pulses: Normal pulses.     Heart sounds: Normal heart sounds. No murmur heard. Pulmonary:     Effort: Pulmonary effort is normal.     Breath sounds: Normal breath sounds. No wheezing, rhonchi or rales.  Abdominal:     General: Abdomen is flat. Bowel sounds are normal. There is no distension.     Palpations: Abdomen is soft.     Tenderness: There is no abdominal tenderness.  Musculoskeletal:        General: No swelling or deformity. Normal range of motion.     Cervical back: Normal range of motion.  Skin:    General: Skin is warm and dry.     Capillary Refill: Capillary refill takes less than 2 seconds.  Neurological:     General: No focal deficit present.     Mental Status: He is alert and oriented to person, place, and time.     Motor: No weakness.  Psychiatric:        Mood and Affect: Mood normal.        Behavior: Behavior normal.        Thought Content: Thought content normal.     Last CBC Lab Results  Component Value Date   WBC 9.7 12/28/2023   HGB 13.6 12/28/2023   HCT 40.2 12/28/2023   MCV 94 12/28/2023   MCH 31.6 12/28/2023   RDW 12.5 12/28/2023   PLT 274 12/28/2023   Last metabolic panel Lab Results  Component Value Date   GLUCOSE 79 12/28/2023   NA 137 12/28/2023   K 4.5 12/28/2023   CL 97 12/28/2023   CO2 27 12/28/2023   BUN 11 12/28/2023   CREATININE 0.69 (L) 12/28/2023   EGFR 100 12/28/2023   CALCIUM 9.9 12/28/2023   PROT 7.5 12/28/2023   ALBUMIN 4.2 12/28/2023   LABGLOB 3.3 12/28/2023   BILITOT 0.3 12/28/2023   ALKPHOS 140 (H) 12/28/2023   AST 17 12/28/2023   ALT 10 12/28/2023    ANIONGAP 9 06/28/2020   Last lipids Lab Results  Component Value Date   CHOL 143 12/28/2023   HDL 41  12/28/2023   LDLCALC 67 12/28/2023   TRIG 212 (H) 12/28/2023   CHOLHDL 3.5 12/28/2023   Last hemoglobin A1c Lab Results  Component Value Date   HGBA1C 5.8 (H) 12/28/2023   Last thyroid functions Lab Results  Component Value Date   TSH 4.310 12/28/2023   Last vitamin D Lab Results  Component Value Date   VD25OH 89.5 12/28/2023   Last vitamin B12 and Folate Lab Results  Component Value Date   VITAMINB12 701 12/28/2023   FOLATE 3.9 12/28/2023   The 10-year ASCVD risk score (Arnett DK, et al., 2019) is: 12.9%    Assessment & Plan:   Problem List Items Addressed This Visit   None   No follow-ups on file.    Billie Lade, MD

## 2024-01-25 ENCOUNTER — Other Ambulatory Visit: Payer: Self-pay | Admitting: Internal Medicine

## 2024-01-25 ENCOUNTER — Encounter: Payer: Self-pay | Admitting: Internal Medicine

## 2024-01-25 DIAGNOSIS — R748 Abnormal levels of other serum enzymes: Secondary | ICD-10-CM | POA: Insufficient documentation

## 2024-01-25 DIAGNOSIS — R7303 Prediabetes: Secondary | ICD-10-CM | POA: Insufficient documentation

## 2024-01-25 LAB — CMP14+EGFR
ALT: 12 IU/L (ref 0–44)
AST: 15 IU/L (ref 0–40)
Albumin: 4.1 g/dL (ref 3.9–4.9)
Alkaline Phosphatase: 127 IU/L — ABNORMAL HIGH (ref 44–121)
BUN/Creatinine Ratio: 17 (ref 10–24)
BUN: 11 mg/dL (ref 8–27)
Bilirubin Total: 0.2 mg/dL (ref 0.0–1.2)
CO2: 26 mmol/L (ref 20–29)
Calcium: 9.4 mg/dL (ref 8.6–10.2)
Chloride: 100 mmol/L (ref 96–106)
Creatinine, Ser: 0.65 mg/dL — ABNORMAL LOW (ref 0.76–1.27)
Globulin, Total: 3 g/dL (ref 1.5–4.5)
Glucose: 91 mg/dL (ref 70–99)
Potassium: 4.4 mmol/L (ref 3.5–5.2)
Sodium: 139 mmol/L (ref 134–144)
Total Protein: 7.1 g/dL (ref 6.0–8.5)
eGFR: 102 mL/min/{1.73_m2} (ref >59)

## 2024-01-25 LAB — GAMMA GT: GGT: 119 IU/L — ABNORMAL HIGH (ref 0–65)

## 2024-01-25 NOTE — Assessment & Plan Note (Signed)
 Lipid panel updated last month.  Total cholesterol 143 and LDL 67.  He remains on atorvastatin 10 mg daily.  No medication changes are indicated.

## 2024-01-25 NOTE — Assessment & Plan Note (Signed)
 Repeat CT chest from December 2024 shows mild progression of R > L apical consolidations with cavitation.  Atypical or fungal infection favored.  Tissue sampling recommended.  CT was obtained as follow-up to previous imaging from May 2024.  Results have been communicated to pulmonology for further management.

## 2024-01-25 NOTE — Assessment & Plan Note (Signed)
 A1c 5.8 on labs from last month.  Dietary changes aimed at lowering his blood sugar were reviewed today.

## 2024-01-25 NOTE — Assessment & Plan Note (Signed)
 Asymptomatic currently.  Pulmonary exam is unremarkable.  He continues to use Trelegy daily and albuterol as needed.

## 2024-01-25 NOTE — Assessment & Plan Note (Signed)
 ALP 140 on labs from last month.  Repeat CMP ordered today and GGT added.

## 2024-01-31 ENCOUNTER — Telehealth: Payer: Self-pay | Admitting: Acute Care

## 2024-01-31 ENCOUNTER — Encounter: Payer: Self-pay | Admitting: Acute Care

## 2024-01-31 ENCOUNTER — Ambulatory Visit (INDEPENDENT_AMBULATORY_CARE_PROVIDER_SITE_OTHER): Admitting: Acute Care

## 2024-01-31 VITALS — BP 129/63 | HR 74 | Ht 70.0 in | Wt 132.2 lb

## 2024-01-31 DIAGNOSIS — F17210 Nicotine dependence, cigarettes, uncomplicated: Secondary | ICD-10-CM | POA: Diagnosis not present

## 2024-01-31 DIAGNOSIS — R918 Other nonspecific abnormal finding of lung field: Secondary | ICD-10-CM

## 2024-01-31 DIAGNOSIS — F17200 Nicotine dependence, unspecified, uncomplicated: Secondary | ICD-10-CM

## 2024-01-31 NOTE — Progress Notes (Addendum)
 History of Present Illness Jaime [name] is a 68 y.o. male with abnormal CT Chest imaging followed through the screening program. He will be followed by Dr. Delton Coombes.   01/31/2024 Pt. Presents for follow up. He states he does not have any respiratory issues at present. He does cough frequently when he eats and drinks. He states he can feel it going deeper, and he coughs the food up. He broke his neck in 2010, and surgeons used an anterior approach to manage his multiple cervical fractures,  which has affected his swallow. He most likely is aspirating. He states secretions are clear. No fever or breathing difficulties.   We have discussed his follow up scan results. His scan was done 10/2023, and not read for 2 months. He is still smoking about 1.3 Packs of cigarettes per day. He started smoking at 65-52 years old. He has been smoking for 57 years, about 1 pack per day on average. ( 57 pack year smoking history) He denies hemoptysis, he has had weight loss, but has been gaining a bit or the weight back.   We have discussed his CT results.  There has been slight progression of right greater than left upper lobe/apical consolidation with cavitation likely related to necrosis and extraalveolar air. Favor atypical (mycobacterial) or fungal infection. Recommendation is for tissue sampling. I told him we may need to biopsy this area. We will  get  a PET scan and re-evaluate the nodule and then see the patient back to determine if he needs biopsy. He is in agreement with this plan. PET scan was ordered.   Test Results: 10/25/2023 CT Chest >> Read 12/28/2023  Mild progression of right greater than left upper lobe/apical consolidation with cavitation likely related to necrosis and extraalveolar air. Favor atypical (mycobacterial) or fungal infection. Consider tissue sampling if not already performed. 2. Similar left lower lobe peribronchovascular reticulonodular opacities are mild and presumably the same  etiology as the upper lobe/apical process. 3. Decrease in size of a right paratracheal node, likely reactive. 4. Aortic atherosclerosis (ICD10-I70.0), coronary artery atherosclerosis and emphysema (ICD10-J43.9).  07/04/2023 CT Chest Marked progression in somewhat cavitary appearing consolidation in the upper lobes, right greater than left, with similar to increased peribronchovascular nodularity and ground-glass in the peripheral left lower lobe. Findings suggest an atypical or fungal infectious process. Underlying malignancy cannot be excluded. Consider bronchoscopic assessment in further evaluation, as clinically indicated. These results will be called to the ordering clinician or representative by the Radiologist Assistant, and communication documented in the PACS or [employer]. 2. Slight enlargement of mediastinal lymph nodes, likely reactive. Again, malignancy cannot be excluded. 3. Aortic atherosclerosis (ICD10-I70.0). Coronary artery calcification. 4.  Emphysema (ICD10-J43.9). CT Chest 03/20/2023 Multiple new aggressive appearing areas of nodular architectural distortion in the upper lobes of the lungs bilaterally, as above. Although any of these lesions could be neoplastic, the multiplicity and upper lung predominance these lesions favors an infectious or inflammatory etiology at this time, and accordingly, today's study is categorized as Lung-RADS 0, incomplete. Close attention on short-term follow-up in 3 months is recommended with repeat low-dose chest CT without contrast (please use the following order, "CT CHEST LCS NODULE FOLLOW-UP W/O CM") to exclude a malignant etiology. 2. The "S" modifier above refers to potentially clinically significant non lung cancer related findings. Specifically, there is aortic atherosclerosis, in addition to left main and three-vessel coronary artery disease. Please note that although the presence of coronary artery calcium documents  the presence of  coronary artery disease, the severity of this disease and any potential stenosis cannot be assessed on this non-gated CT examination. Assessment for potential risk factor modification, dietary therapy or pharmacologic therapy may be warranted, if clinically indicated. 3. Mild diffuse bronchial wall thickening with moderate centrilobular and paraseptal emphysema; imaging findings suggestive of underlying COPD.   These results will be called to the ordering clinician or representative by the Radiologist Assistant, and communication documented in the PACS or [employer].   Aortic Atherosclerosis  MR Cervical Spine 07/13/2008 Severe spinal stenosis from C3-4 through C6-7, most severe at the  C3-4 and C4-5 levels where the subarachnoid space is effaced and  the cord is markedly deformed.      Latest Ref Rng & Units 12/28/2023    1:47 PM 06/28/2020    5:51 PM 11/23/2019    9:25 PM  CBC  WBC 3.4 - 10.8 x10E3/uL 9.7  9.3  11.3   Hemoglobin 13.0 - 17.7 g/dL 84.6  96.2  95.2   Hematocrit 37.5 - 51.0 % 40.2  39.5  45.8   Platelets 150 - 450 x10E3/uL 274  241  241        Latest Ref Rng & Units 01/24/2024    3:12 PM 12/28/2023    1:47 PM 06/28/2020    5:51 PM  BMP  Glucose 70 - 99 mg/dL 91  79  841   BUN 8 - 27 mg/dL 11  11  12    Creatinine 0.76 - 1.27 mg/dL 3.24  4.01  0.27   BUN/Creat Ratio 10 - 24 17  16     Sodium 134 - 144 mmol/L 139  137  135   Potassium 3.5 - 5.2 mmol/L 4.4  4.5  4.0   Chloride 96 - 106 mmol/L 100  97  100   CO2 20 - 29 mmol/L 26  27  26    Calcium 8.6 - 10.2 mg/dL 9.4  9.9  9.1     BNP No results found for: "BNP"  ProBNP No results found for: "PROBNP"  PFT No results found for: "FEV1PRE", "FEV1POST", "FVCPRE", "FVCPOST", "TLC", "DLCOUNC", "PREFEV1FVCRT", "PSTFEV1FVCRT"  No results found.   Past medical hx Past Medical History:  Diagnosis Date   Anxiety    Arthritis    BPH (benign prostatic hyperplasia)    BPH (benign prostatic  hyperplasia) 06/08/2017   Chest pain    Chronic pain    neck   Constipation    Genital herpes    Hypothyroidism    Neuropathy    Paralysis, unspecified    Upper extremity weakness s/p severe neck injury   Tobacco abuse      Social History   Tobacco Use   Smoking status: Every Day    Current packs/day: 1.50    Average packs/day: 1.5 packs/day for 53.2 years (79.8 ttl pk-yrs)    Types: Cigarettes    Start date: 1972    Passive exposure: Never   Smokeless tobacco: Never  Substance Use Topics   Alcohol use: No    Comment: quit about 4 years ago; denied 04/05/18   Drug use: No    Mr.Weigel reports that he has been smoking cigarettes. He started smoking about 53 years ago. He has a 79.8 pack-year smoking history. He has never been exposed to tobacco smoke. He has never used smokeless tobacco. He reports that he does not drink alcohol and does not use drugs.  Tobacco Cessation:  Current every day smoker , I spent 3-4 minutes counseling  patient on  steps to stop use of tobacco products. I have provided patient with information on receiving free nicotine replacement therapy, and contact numbers for hypnosis for smoking cessation as well as acupuncture for smoking cessation.   Past surgical hx, Family hx, Social hx all reviewed.  Current Outpatient Medications on File Prior to Visit  Medication Sig   albuterol (VENTOLIN HFA) 108 (90 Base) MCG/ACT inhaler Inhale 2 puffs into the lungs every 6 (six) hours as needed.   atorvastatin (LIPITOR) 10 MG tablet Take 1 tablet (10 mg total) by mouth daily.   azithromycin (ZITHROMAX) 250 MG tablet Take 2 tablets on day 1, then 1 tablet daily on days 2 through 5   benzonatate (TESSALON) 200 MG capsule Take 1 capsule (200 mg total) by mouth 2 (two) times daily as needed for cough.   fluticasone (FLONASE) 50 MCG/ACT nasal spray Place 1 spray into both nostrils daily.   gabapentin (NEURONTIN) 400 MG capsule Take 400 mg by mouth 2 (two) times daily.    HYDROcodone-acetaminophen (NORCO) 10-325 MG per tablet Take 1 tablet by mouth 4 (four) times daily.   ipratropium (ATROVENT) 0.06 % nasal spray Place 2 sprays into both nostrils 3 (three) times daily.   nystatin (MYCOSTATIN/NYSTOP) powder Apply 1 Application topically 3 (three) times daily.   TRELEGY ELLIPTA 200-62.5-25 MCG/ACT AEPB INHALE 1 PUFF INTO THE LUNGS DAILY   valACYclovir (VALTREX) 1000 MG tablet TAKE 1 TABLET BY MOUTH DAILY   No current facility-administered medications on file prior to visit.     Allergies  Allergen Reactions   Celebrex [Celecoxib] Itching    Hot Flashes   Ciprofloxacin Hives   Contrast Media [Iodinated Contrast Media]     Hypotension   Dilaudid [Hydromorphone] Other (See Comments)    hallunications   Latex Hives   Morphine And Codeine     Panic Attack   Sulfa Antibiotics Other (See Comments)    Hot Flashes    Review Of Systems:  Constitutional:   + weight loss, No night sweats,  Fevers, chills, fatigue, or  lassitude.  HEENT:   No headaches,  Difficulty swallowing,  Tooth/dental problems, or  Sore throat,                No sneezing, itching, ear ache, nasal congestion, post nasal drip,   CV:  No chest pain,  Orthopnea, PND, swelling in lower extremities, anasarca, dizziness, palpitations, syncope.   GI  No heartburn, indigestion, abdominal pain, nausea, vomiting, diarrhea, change in bowel habits, loss of appetite, bloody stools.   Resp: No shortness of breath with exertion or at rest.  + excess mucus, + occasional  productive cough,  No non-productive cough,  No coughing up of blood.  No change in color of mucus.  No wheezing.  No chest wall deformity  Skin: no rash or lesions.  GU: no dysuria, change in color of urine, no urgency or frequency.  No flank pain, no hematuria   MS:  No joint pain or swelling.  No decreased range of motion.  No back pain.  Psych:  No change in mood or affect. No depression or anxiety.  No memory  loss.   Vital Signs BP 129/63 (BP Location: Left Arm, Patient Position: Sitting, Cuff Size: Normal)   Pulse 74   Ht 5\' 10"  (1.778 m)   Wt 132 lb 3.2 oz (60 kg)   SpO2 98%   BMI 18.97 kg/m    Physical Exam:  General- No distress,  A&Ox3,  pleasant  ENT: No sinus tenderness, TM clear, pale nasal mucosa, no oral exudate,no post nasal drip, no LAN, + HOH Cardiac: S1, S2, regular rate and rhythm, no murmur Chest: No wheeze/ rales/ dullness; no accessory muscle use, no nasal flaring, no sternal retractions, rhonchi throughout which clears partially with cough Abd.: Soft Non-tender, ND, BS +, Body mass index is 18.97 kg/m.  Ext: No clubbing cyanosis, edema, no obvious deformities Neuro:  normal strength, MAE x 4, A&O x 3, appropriate Skin: No rashes, warm and dry, no obvious lesions Psych: normal mood and behavior   Assessment/Plan Progressive right greater than left upper lobe/apical consolidation with cavitation likely related to necrosis and extraalveolar air. Favor atypical (mycobacterial) or fungal infection.  Tissue Sampling recommended Current every day smoker  Plan The lung nodule we have been following has continued to progress.  We will do a PET scan to better evaluate it. You will get a call to get this scheduled.  You will follow up with me 1 week after the scan to review results. You will get a call to get this scheduled.  Once we see results, we will make a decision on biopsy, as we discussed today in the office . Follow up in about 2 weeks to review PET results.  Call if you need [place] sooner. Please work on quitting smoking. I know it is hard.  You can receive free nicotine replacement therapy (patches, gum, or mints) by calling 1-800-QUIT NOW. Please call so we can get you on the path to becoming a non-smoker. I know it is hard, but you can do this!  Hypnosis for smoking cessation  [employer] [phone]  Acupuncture for smoking cessation  [employer] [phone]   Please contact office for sooner follow up if symptoms do not improve or worsen or seek emergency care .   Chronic tobacco use Smokes 24-25 cigarettes daily. Smoking cessation advised to reduce lung damage and malignancy risk. Provided resources for nicotine replacement therapy. - Provide information on free nicotine patches, gum, or mints.  Follow-up Follow-up needed to review PET scan results and determine management. - Schedule follow-up within a week of PET scan to review results and plan further management.   I spent 35 minutes dedicated to the care of this patient on the date of this encounter to include pre-visit review of records, face-to-face time with the patient discussing conditions above, post visit ordering of testing, clinical documentation with the electronic health record, making appropriate referrals as documented, and communicating necessary information to the patient's healthcare team.   Bevelyn Ngo, NP 01/31/2024  10:22 AM

## 2024-01-31 NOTE — Telephone Encounter (Signed)
 Patient had a CT chest on 07/04/2023, resulted on 07/12/2023, and was advised by Dr. Tonia Brooms and Kandice Robinsons NP for patient to have a 4 week follow up scan due to cavitary appearing consolidation, after completing abx scripted by Kandice Robinsons NP. Scan was ordered and scheduled for 08/08/2023 at 10:30am. Patient was called and left a VM by radiology scheduler at 9:44am on 08/08/2023 and cancelled the appt of the CT chest due to "CT being down". Per pt's chart there was no additional follow up/notification to ordering office or contact to reschedule the scan. Patient was then seen by Dr. Durwin Nora on 10/24/2023 as a new patient and he ordered a new CT chest, scheduled for 10/25/2023. This CT chest was not resulted until 12/28/2023. Dr. Durwin Nora reviewed the results on 12/29/2023 and requested pulm be notified. Per pt's chart it states "mailed results", unsure where results were mailed. Patient then saw Dr. Durwin Nora on 01/24/2024. Kandice Robinsons NP was informed on 01/24/2024 the pt needs a pulm follow up to review the results. Patient was then scheduled to see Kandice Robinsons NP asap and scheduled OV on 01/31/2024. Patient was seen at today's appt 01/31/24 and advised to have PET scan with OV to review results. Will keep pt on follow up list to assure follow up.

## 2024-01-31 NOTE — Patient Instructions (Signed)
 It is good to see you today.  The lung nodule we have been following has continued to progress.  We will do a PET scan to better evaluate it. You will get a call to get this scheduled.  You will follow up with me 1 week after the scan to review results. You will get a call to get this scheduled.  Once we see results, we will make a decision on biopsy, as we discussed today in the office . Follow up in about 2 weeks to review PET results.  Call if you need [place] sooner. Please work on quitting smoking. I know it is hard.  You can receive free nicotine replacement therapy (patches, gum, or mints) by calling 1-800-QUIT NOW. Please call so we can get you on the path to becoming a non-smoker. I know it is hard, but you can do this!  Hypnosis for smoking cessation  [employer] [phone]  Acupuncture for smoking cessation  [employer] [phone]   Please contact office for sooner follow up if symptoms do not improve or worsen or seek emergency care .

## 2024-02-08 ENCOUNTER — Encounter (HOSPITAL_COMMUNITY): Admission: RE | Admit: 2024-02-08 | Source: Ambulatory Visit

## 2024-02-12 NOTE — Telephone Encounter (Signed)
 Patient No-Showed to his PET scan appt. I called and spoke to pt. He states he became confused on what he thought the appt was for. Patient states he thought it was for a bronch. Advised patient the importance of having this PET scan. Patient is agreeable to reschedule. Will also need to reschedule OV with Sarah. Patient is aware he will receive a call to reschedule both appts, PET and OV with SG.   PCCs please advise on PET scan, soonest available possible. Thanks.

## 2024-02-12 NOTE — Telephone Encounter (Signed)
 PET & ROV with Maralyn Sago has been rescheduled.

## 2024-02-13 ENCOUNTER — Ambulatory Visit: Admitting: Acute Care

## 2024-02-13 DIAGNOSIS — Z125 Encounter for screening for malignant neoplasm of prostate: Secondary | ICD-10-CM | POA: Diagnosis not present

## 2024-02-13 DIAGNOSIS — F17210 Nicotine dependence, cigarettes, uncomplicated: Secondary | ICD-10-CM | POA: Diagnosis not present

## 2024-02-13 DIAGNOSIS — Z1159 Encounter for screening for other viral diseases: Secondary | ICD-10-CM | POA: Diagnosis not present

## 2024-02-13 DIAGNOSIS — Z7251 High risk heterosexual behavior: Secondary | ICD-10-CM | POA: Diagnosis not present

## 2024-02-13 DIAGNOSIS — E7800 Pure hypercholesterolemia, unspecified: Secondary | ICD-10-CM | POA: Diagnosis not present

## 2024-02-13 DIAGNOSIS — R7303 Prediabetes: Secondary | ICD-10-CM | POA: Diagnosis not present

## 2024-02-13 DIAGNOSIS — R5383 Other fatigue: Secondary | ICD-10-CM | POA: Diagnosis not present

## 2024-02-13 DIAGNOSIS — E559 Vitamin D deficiency, unspecified: Secondary | ICD-10-CM | POA: Diagnosis not present

## 2024-02-13 DIAGNOSIS — D539 Nutritional anemia, unspecified: Secondary | ICD-10-CM | POA: Diagnosis not present

## 2024-02-13 DIAGNOSIS — J449 Chronic obstructive pulmonary disease, unspecified: Secondary | ICD-10-CM | POA: Diagnosis not present

## 2024-02-15 ENCOUNTER — Encounter (HOSPITAL_COMMUNITY): Admission: RE | Admit: 2024-02-15 | Source: Ambulatory Visit

## 2024-02-15 DIAGNOSIS — Z681 Body mass index (BMI) 19 or less, adult: Secondary | ICD-10-CM | POA: Diagnosis not present

## 2024-02-15 DIAGNOSIS — M545 Low back pain, unspecified: Secondary | ICD-10-CM | POA: Diagnosis not present

## 2024-02-15 DIAGNOSIS — Z79899 Other long term (current) drug therapy: Secondary | ICD-10-CM | POA: Diagnosis not present

## 2024-02-15 DIAGNOSIS — F17210 Nicotine dependence, cigarettes, uncomplicated: Secondary | ICD-10-CM | POA: Diagnosis not present

## 2024-02-19 ENCOUNTER — Ambulatory Visit (HOSPITAL_COMMUNITY)

## 2024-02-19 NOTE — Telephone Encounter (Signed)
 Patient expressed frustration with the calls regarding the PET scan and stated they no longer wish to proceed with the scan. The patient ended the call before I could get any further information and has not returned follow-up attempts.

## 2024-02-20 DIAGNOSIS — Z79899 Other long term (current) drug therapy: Secondary | ICD-10-CM | POA: Diagnosis not present

## 2024-02-22 ENCOUNTER — Encounter (HOSPITAL_COMMUNITY)
Admission: RE | Admit: 2024-02-22 | Discharge: 2024-02-22 | Disposition: A | Discharge status: Home / Self Care | Source: Ambulatory Visit | Attending: Acute Care | Admitting: Acute Care

## 2024-02-22 DIAGNOSIS — R918 Other nonspecific abnormal finding of lung field: Secondary | ICD-10-CM | POA: Insufficient documentation

## 2024-02-22 MED ORDER — FLUDEOXYGLUCOSE F - 18 (FDG) INJECTION
6.8100 | Freq: Once | INTRAVENOUS | Status: AC | PRN
  Administered 2024-02-22: 6.81 via INTRAVENOUS

## 2024-02-26 ENCOUNTER — Ambulatory Visit: Admitting: Acute Care

## 2024-02-27 ENCOUNTER — Ambulatory Visit: Admitting: Acute Care

## 2024-02-27 ENCOUNTER — Ambulatory Visit (HOSPITAL_COMMUNITY): Attending: Internal Medicine

## 2024-02-29 ENCOUNTER — Ambulatory Visit: Admitting: Nurse Practitioner

## 2024-02-29 DIAGNOSIS — E559 Vitamin D deficiency, unspecified: Secondary | ICD-10-CM | POA: Diagnosis not present

## 2024-02-29 DIAGNOSIS — E7800 Pure hypercholesterolemia, unspecified: Secondary | ICD-10-CM | POA: Diagnosis not present

## 2024-02-29 DIAGNOSIS — J449 Chronic obstructive pulmonary disease, unspecified: Secondary | ICD-10-CM | POA: Diagnosis not present

## 2024-02-29 DIAGNOSIS — B009 Herpesviral infection, unspecified: Secondary | ICD-10-CM | POA: Diagnosis not present

## 2024-02-29 DIAGNOSIS — R0602 Shortness of breath: Secondary | ICD-10-CM | POA: Diagnosis not present

## 2024-02-29 DIAGNOSIS — R7303 Prediabetes: Secondary | ICD-10-CM | POA: Diagnosis not present

## 2024-02-29 DIAGNOSIS — F17210 Nicotine dependence, cigarettes, uncomplicated: Secondary | ICD-10-CM | POA: Diagnosis not present

## 2024-02-29 DIAGNOSIS — Z87891 Personal history of nicotine dependence: Secondary | ICD-10-CM | POA: Diagnosis not present

## 2024-02-29 DIAGNOSIS — R519 Headache, unspecified: Secondary | ICD-10-CM | POA: Diagnosis not present

## 2024-03-15 DIAGNOSIS — Z681 Body mass index (BMI) 19 or less, adult: Secondary | ICD-10-CM | POA: Diagnosis not present

## 2024-03-15 DIAGNOSIS — F17210 Nicotine dependence, cigarettes, uncomplicated: Secondary | ICD-10-CM | POA: Diagnosis not present

## 2024-03-15 DIAGNOSIS — M545 Low back pain, unspecified: Secondary | ICD-10-CM | POA: Diagnosis not present

## 2024-03-15 DIAGNOSIS — Z79899 Other long term (current) drug therapy: Secondary | ICD-10-CM | POA: Diagnosis not present

## 2024-03-28 ENCOUNTER — Encounter: Payer: Self-pay | Admitting: Emergency Medicine

## 2024-04-11 ENCOUNTER — Telehealth: Payer: Self-pay | Admitting: Acute Care

## 2024-04-11 NOTE — Telephone Encounter (Signed)
 Returned call from VM.   Spoke to patient who says he didn't call us  but he received a letter.  The recent letter was to reschedule an OV follow up to review PET scan results.  Patient states he didn't need the appointment because he knows he has some neck problems that affect him and that is what is showing up on the PET.  Inquired if someone reviewed the PET results with him and he said no.  It was unclear why he had this impression of results.  He said someone mentioned it but didn't know who.  So he feels its not important to review the results.  Advised the PET is a very detailed type of imaging and our provider requests patients come in to review these findings in person and plan for any additional needs related to the results.  Provider will review the results, advise, answer questions and they typically want to sit with the patient to discuss important details.  Patient states he can't schedule an appointment today but he will 'come by' in a few days and make an appointment.  Encouraged to please make the appointment, as this is an important discussion related to his care.  Patient states he will reschedule but not today.

## 2024-04-15 DIAGNOSIS — Z79899 Other long term (current) drug therapy: Secondary | ICD-10-CM | POA: Diagnosis not present

## 2024-04-15 DIAGNOSIS — J449 Chronic obstructive pulmonary disease, unspecified: Secondary | ICD-10-CM | POA: Diagnosis not present

## 2024-04-15 DIAGNOSIS — F17210 Nicotine dependence, cigarettes, uncomplicated: Secondary | ICD-10-CM | POA: Diagnosis not present

## 2024-04-15 DIAGNOSIS — M545 Low back pain, unspecified: Secondary | ICD-10-CM | POA: Diagnosis not present

## 2024-04-15 DIAGNOSIS — Z681 Body mass index (BMI) 19 or less, adult: Secondary | ICD-10-CM | POA: Diagnosis not present

## 2024-04-18 DIAGNOSIS — Z79899 Other long term (current) drug therapy: Secondary | ICD-10-CM | POA: Diagnosis not present

## 2024-04-23 ENCOUNTER — Ambulatory Visit: Admitting: Internal Medicine

## 2024-04-29 DIAGNOSIS — J449 Chronic obstructive pulmonary disease, unspecified: Secondary | ICD-10-CM | POA: Diagnosis not present

## 2024-04-29 DIAGNOSIS — E559 Vitamin D deficiency, unspecified: Secondary | ICD-10-CM | POA: Diagnosis not present

## 2024-04-29 DIAGNOSIS — J302 Other seasonal allergic rhinitis: Secondary | ICD-10-CM | POA: Diagnosis not present

## 2024-04-29 DIAGNOSIS — Z87891 Personal history of nicotine dependence: Secondary | ICD-10-CM | POA: Diagnosis not present

## 2024-04-29 DIAGNOSIS — R7303 Prediabetes: Secondary | ICD-10-CM | POA: Diagnosis not present

## 2024-04-29 DIAGNOSIS — R9431 Abnormal electrocardiogram [ECG] [EKG]: Secondary | ICD-10-CM | POA: Diagnosis not present

## 2024-04-29 DIAGNOSIS — F17210 Nicotine dependence, cigarettes, uncomplicated: Secondary | ICD-10-CM | POA: Diagnosis not present

## 2024-04-29 DIAGNOSIS — E7800 Pure hypercholesterolemia, unspecified: Secondary | ICD-10-CM | POA: Diagnosis not present

## 2024-04-29 DIAGNOSIS — H6691 Otitis media, unspecified, right ear: Secondary | ICD-10-CM | POA: Diagnosis not present

## 2024-04-30 ENCOUNTER — Encounter: Payer: Self-pay | Admitting: Internal Medicine

## 2024-05-01 DIAGNOSIS — J449 Chronic obstructive pulmonary disease, unspecified: Secondary | ICD-10-CM | POA: Diagnosis not present

## 2024-05-01 DIAGNOSIS — J302 Other seasonal allergic rhinitis: Secondary | ICD-10-CM | POA: Diagnosis not present

## 2024-05-01 DIAGNOSIS — H6691 Otitis media, unspecified, right ear: Secondary | ICD-10-CM | POA: Diagnosis not present

## 2024-05-01 DIAGNOSIS — E7800 Pure hypercholesterolemia, unspecified: Secondary | ICD-10-CM | POA: Diagnosis not present

## 2024-05-01 DIAGNOSIS — R9431 Abnormal electrocardiogram [ECG] [EKG]: Secondary | ICD-10-CM | POA: Diagnosis not present

## 2024-05-01 DIAGNOSIS — R7303 Prediabetes: Secondary | ICD-10-CM | POA: Diagnosis not present

## 2024-05-01 DIAGNOSIS — Z9181 History of falling: Secondary | ICD-10-CM | POA: Diagnosis not present

## 2024-05-01 DIAGNOSIS — E559 Vitamin D deficiency, unspecified: Secondary | ICD-10-CM | POA: Diagnosis not present

## 2024-05-01 DIAGNOSIS — F17210 Nicotine dependence, cigarettes, uncomplicated: Secondary | ICD-10-CM | POA: Diagnosis not present

## 2024-05-04 DIAGNOSIS — Z681 Body mass index (BMI) 19 or less, adult: Secondary | ICD-10-CM | POA: Diagnosis not present

## 2024-05-04 DIAGNOSIS — F17210 Nicotine dependence, cigarettes, uncomplicated: Secondary | ICD-10-CM | POA: Diagnosis not present

## 2024-05-04 DIAGNOSIS — H9211 Otorrhea, right ear: Secondary | ICD-10-CM | POA: Diagnosis not present

## 2024-06-13 NOTE — Telephone Encounter (Signed)
 I spoke to Jaime [name] about his ear infection. He states that he has the ear drops but it is hard to put only four drops in it due to the bottle. I informed him that it is ok as long as he get at least the four drops in the ear canal. He also states that his ear is still draining, I informed him that the drops are used to clear the infection up but he have to continue to use them in order for it to work and if it doesn't start to clear up within the 10 days to call the office back to schedule the appointment.

## 2024-06-14 NOTE — Telephone Encounter (Signed)
 Spoke with [facility]. Pt is scheduled to see PCP Dr. Jerrye today and Pulmonology next week, Dr. Geofm.

## 2024-06-17 NOTE — Telephone Encounter (Signed)
 I have left a message with Dr. Jerrye and Dr. Geofm office requesting they inform our office if they will be providing follow up care for patient in regards to his lung nodules going forward.

## 2024-06-23 ENCOUNTER — Other Ambulatory Visit: Payer: Self-pay

## 2024-06-23 ENCOUNTER — Encounter (HOSPITAL_COMMUNITY): Payer: Self-pay | Admitting: *Deleted

## 2024-06-23 ENCOUNTER — Emergency Department (HOSPITAL_COMMUNITY)
Admission: EM | Admit: 2024-06-23 | Discharge: 2024-06-23 | Disposition: A | Discharge status: Home / Self Care | Attending: Emergency Medicine | Admitting: Emergency Medicine

## 2024-06-23 DIAGNOSIS — H61001 Unspecified perichondritis of right external ear: Secondary | ICD-10-CM | POA: Insufficient documentation

## 2024-06-23 DIAGNOSIS — F17200 Nicotine dependence, unspecified, uncomplicated: Secondary | ICD-10-CM | POA: Insufficient documentation

## 2024-06-23 DIAGNOSIS — R21 Rash and other nonspecific skin eruption: Secondary | ICD-10-CM | POA: Diagnosis present

## 2024-06-23 DIAGNOSIS — E039 Hypothyroidism, unspecified: Secondary | ICD-10-CM | POA: Insufficient documentation

## 2024-06-23 DIAGNOSIS — M948X9 Other specified disorders of cartilage, unspecified sites: Secondary | ICD-10-CM

## 2024-06-23 LAB — CBC
HCT: 40.3 % (ref 39.0–52.0)
Hemoglobin: 13 g/dL (ref 13.0–17.0)
MCH: 32.6 pg (ref 26.0–34.0)
MCHC: 32.3 g/dL (ref 30.0–36.0)
MCV: 101 fL — ABNORMAL HIGH (ref 80.0–100.0)
Platelets: 216 K/uL (ref 150–400)
RBC: 3.99 MIL/uL — ABNORMAL LOW (ref 4.22–5.81)
RDW: 13.5 % (ref 11.5–15.5)
WBC: 8.1 K/uL (ref 4.0–10.5)
nRBC: 0 % (ref 0.0–0.2)

## 2024-06-23 LAB — COMPREHENSIVE METABOLIC PANEL WITH GFR
ALT: 17 U/L (ref 0–44)
AST: 23 U/L (ref 15–41)
Albumin: 3.7 g/dL (ref 3.5–5.0)
Alkaline Phosphatase: 107 U/L (ref 38–126)
Anion gap: 9 (ref 5–15)
BUN: 8 mg/dL (ref 8–23)
CO2: 29 mmol/L (ref 22–32)
Calcium: 9 mg/dL (ref 8.9–10.3)
Chloride: 97 mmol/L — ABNORMAL LOW (ref 98–111)
Creatinine, Ser: 0.71 mg/dL (ref 0.61–1.24)
GFR, Estimated: >60 mL/min (ref >60)
Glucose, Bld: 96 mg/dL (ref 70–99)
Potassium: 3.6 mmol/L (ref 3.5–5.1)
Sodium: 135 mmol/L (ref 135–145)
Total Bilirubin: 0.4 mg/dL (ref 0.0–1.2)
Total Protein: 7.5 g/dL (ref 6.5–8.1)

## 2024-06-23 MED ORDER — HYDROCORTISONE 1 % EX CREA: TOPICAL_CREAM | CUTANEOUS | 0 refills | Status: AC

## 2024-06-23 MED ORDER — CLINDAMYCIN HCL 150 MG PO CAPS: 150.0000 mg | ORAL_CAPSULE | Freq: Four times a day (QID) | ORAL | 0 refills | Status: AC

## 2024-06-23 MED ORDER — DIPHENHYDRAMINE HCL 25 MG PO CAPS
50.0000 mg | ORAL_CAPSULE | Freq: Once | ORAL | Status: AC
  Administered 2024-06-23: 25 mg via ORAL
  Filled 2024-06-23: qty 2

## 2024-06-23 NOTE — ED Provider Notes (Signed)
 [place] [facility] AT [facility] Provider Note  MDM   HPI/ROS:  Jaime [name] is a 68 y.o. male with a medical history as below who presents with concerns for itchiness and rash.  Patient reports he started ciprofloxacin yesterday for an ear infection Per his ENT.  Reports that he is allergic.  Denies any difficulty breathing, wheezing, abdominal pain nausea or vomiting.  States the rash is primarily on his face and arms.  Physical exam is notable for: - Erythematous rash to the face and bilateral arms.  No distinct urticaria.  Some itching and dry skin.  Inflamed right ear with scaling  On my initial evaluation, patient is:  -Vital signs stable. Patient afebrile, hemodynamically stable, and non-toxic appearing. -Additional history obtained from chart review  This patient's current presentation, including their history and physical exam, is most consistent with allergic reaction. Differentials include allergic reaction, anaphylaxis, perichondritis, atopic dermatitis, irritant dermatitis, SJS.    Physical exam is reassuring he does have an erythematous rash but no obvious urticaria.  No stews at some involvement that would be concerning for anaphylaxis.  He does not have any skin sloughing or pain making SJS extremely unlikely.  He does have documented ciprofloxacin allergy and has been prescribed it since yesterday and the rash onset after taking his initial dose.  Believe this is likely allergic reaction.  He took Benadryl  earlier today and received an additional dose with us .  The symptoms are restrained only to itching at this time.  Will provide hydrocortisone  cream.  Did switch his antibiotics to clindamycin .  I discussed with him that this does not fully cover the range of bacteria that would be concerning for ear infections and that he needs to follow closely with his ENT given this.  He was given strict return precautions for his ear and his allergic reaction and  discharged in stable condition.  Disposition:  I discussed the plan for discharge with the patient and/or their surrogate at bedside prior to discharge and they were in agreement with the plan and verbalized understanding of the return precautions provided. All questions answered to the best of my ability. Ultimately, the patient was discharged in stable condition with stable vital signs. I am reassured that they are capable of close follow up and good social support at home.   Clinical Impression:  1. Perichondritis     Rx / DC Orders ED Discharge Orders          Ordered    clindamycin  (CLEOCIN ) 150 MG capsule  Every 6 hours        06/23/24 2004    hydrocortisone  cream 1 %        06/23/24 2006            The plan for this patient was discussed with Dr. Yolande, who voiced agreement and who oversaw evaluation and treatment of this patient.   Clinical Complexity A medically appropriate history, review of systems, and physical exam was performed.  My independent interpretations of EKG, labs, and radiology are documented in the ED course above.   If decision rules were used in this patient's evaluation, they are listed below.   Click here for ABCD2, HEART and other calculatorsREFRESH Note before signing   Patient's presentation is most consistent with acute presentation with potential threat to life or bodily function.  Medical Decision Making   HPI/ROS      See MDM section for pertinent HPI and ROS. A complete ROS was performed  with pertinent positives/negatives noted above.   Past Medical History:  Diagnosis Date   Anxiety    Arthritis    BPH (benign prostatic hyperplasia)    BPH (benign prostatic hyperplasia) 06/08/2017   Chest pain    Chronic pain    neck   Constipation    Genital herpes    Hypothyroidism    Neuropathy    Paralysis, unspecified    Upper extremity weakness s/p severe neck injury   Tobacco abuse     Past Surgical History:  Procedure  Laterality Date   CHOLECYSTECTOMY  1982   chordoma  1996   COLONOSCOPY  2001   pt reports [facility] at [facility]   COLONOSCOPY  05/30/2012   Dr. Shaaron: tubular adenomas removed. next TCS in 5 years.    COLONOSCOPY WITH PROPOFOL  N/A 06/14/2018   Procedure: COLONOSCOPY WITH PROPOFOL ;  Surgeon: Shaaron Lamar HERO, MD;  Location: [facility];  Service: Endoscopy;  Laterality: N/A;  2:15pm-moved to 11:15 per Mindy   FLEXIBLE SIGMOIDOSCOPY N/A 07/27/2023   Procedure: FLEXIBLE SIGMOIDOSCOPY;  Surgeon: Shaaron Lamar HERO, MD;  Location: [facility];  Service: Endoscopy;  Laterality: N/A;   MASTOIDECTOMY  1991   NECK SURGERY  2009   fracture C3-7      Physical Exam   Vitals:   06/23/24 1723 06/23/24 1748  BP:  121/68  Pulse:  65  Resp:  20  Temp:  97.7 F (36.5 C)  TempSrc:  Oral  SpO2:  100%  Weight: 60 kg   Height: 5' 10 (1.778 m)     Physical Exam Vitals and nursing note reviewed.  Constitutional:      General: He is not in acute distress.    Appearance: He is well-developed.  HENT:     Head: Normocephalic and atraumatic.     Comments: Diffuse erythematous rash    Right Ear: Swelling and tenderness present.     Ears:     Comments: Scaling skin    Mouth/Throat:     Lips: Pink.     Pharynx: No pharyngeal swelling.  Eyes:     Conjunctiva/sclera: Conjunctivae normal.  Cardiovascular:     Rate and Rhythm: Normal rate and regular rhythm.     Heart sounds: No murmur heard. Pulmonary:     Effort: Pulmonary effort is normal. No respiratory distress.     Breath sounds: Normal breath sounds. No stridor or transmitted upper airway sounds.  Abdominal:     Palpations: Abdomen is soft.     Tenderness: There is no abdominal tenderness.  Musculoskeletal:        General: No swelling.     Cervical back: Neck supple.     Comments: Erythematous rash bilateral upper extremities.  2+ radial pulse  Skin:    General: Skin is warm and dry.     Capillary Refill: Capillary refill takes less than 2  seconds.  Neurological:     Mental Status: He is alert.  Psychiatric:        Mood and Affect: Mood normal.      Procedures   If procedures were preformed on this patient, they are listed below:  Procedures   @BBSIG @   Please note that this documentation was produced with the assistance of voice-to-text technology and may contain errors.    Sharyne Darina RAMAN, MD 06/23/24 2009    Yolande Lamar BROCKS, MD 06/24/24 KENITH

## 2024-06-23 NOTE — ED Triage Notes (Signed)
 The pt saw an ent doctor  yesterday with problems with his rt ear  he was given  teva and ointment and a rx for cipro  this am he has a burning all over his body with itching  and he has a red flush to his skin

## 2024-06-23 NOTE — ED Triage Notes (Signed)
 The pt has benadryl  25 mg 2 hours ago   has not helped much

## 2024-06-23 NOTE — Discharge Instructions (Addendum)
 You were seen today for allergic reaction to medication. While you were here we monitored your vitals, preformed a physical exam.    Things to do:  - Follow up with your primary care provider within the next 1-2 weeks - Please stop the ciprofloxacin and do not take any additional doses.  Please begin taking clindamycin  and follow-up closely with your ENT  Return to the emergency department if you have any new or worsening symptoms including worsening swelling or pain in your ear, difficulty breathing, or if you have any other concerns.

## 2024-06-23 NOTE — ED Triage Notes (Signed)
 The pt has cipro as an allergy on his  allergy list in the computer

## 2024-06-25 ENCOUNTER — Encounter (HOSPITAL_BASED_OUTPATIENT_CLINIC_OR_DEPARTMENT_OTHER): Payer: Self-pay | Admitting: Emergency Medicine

## 2024-06-25 ENCOUNTER — Emergency Department (HOSPITAL_COMMUNITY)
Admission: EM | Admit: 2024-06-25 | Discharge: 2024-06-25 | Disposition: A | Discharge status: Home / Self Care | Attending: Emergency Medicine | Admitting: Emergency Medicine

## 2024-06-25 ENCOUNTER — Encounter (HOSPITAL_COMMUNITY): Payer: Self-pay

## 2024-06-25 ENCOUNTER — Other Ambulatory Visit: Payer: Self-pay

## 2024-06-25 ENCOUNTER — Emergency Department (HOSPITAL_BASED_OUTPATIENT_CLINIC_OR_DEPARTMENT_OTHER)
Admission: EM | Admit: 2024-06-25 | Discharge: 2024-06-25 | Disposition: A | Discharge status: Home / Self Care | Source: Ambulatory Visit | Attending: Emergency Medicine | Admitting: Emergency Medicine

## 2024-06-25 DIAGNOSIS — R29810 Facial weakness: Secondary | ICD-10-CM | POA: Diagnosis not present

## 2024-06-25 DIAGNOSIS — Z720 Tobacco use: Secondary | ICD-10-CM | POA: Insufficient documentation

## 2024-06-25 DIAGNOSIS — E039 Hypothyroidism, unspecified: Secondary | ICD-10-CM | POA: Insufficient documentation

## 2024-06-25 DIAGNOSIS — Z91040 Latex allergy status: Secondary | ICD-10-CM | POA: Insufficient documentation

## 2024-06-25 DIAGNOSIS — R21 Rash and other nonspecific skin eruption: Secondary | ICD-10-CM | POA: Diagnosis present

## 2024-06-25 LAB — COMPREHENSIVE METABOLIC PANEL WITH GFR
ALT: 15 U/L (ref 0–44)
AST: 26 U/L (ref 15–41)
Albumin: 4 g/dL (ref 3.5–5.0)
Alkaline Phosphatase: 121 U/L (ref 38–126)
Anion gap: 13 (ref 5–15)
BUN: 6 mg/dL — ABNORMAL LOW (ref 8–23)
CO2: 26 mmol/L (ref 22–32)
Calcium: 9.3 mg/dL (ref 8.9–10.3)
Chloride: 101 mmol/L (ref 98–111)
Creatinine, Ser: 0.57 mg/dL — ABNORMAL LOW (ref 0.61–1.24)
GFR, Estimated: >60 mL/min (ref >60)
Glucose, Bld: 169 mg/dL — ABNORMAL HIGH (ref 70–99)
Potassium: 3.7 mmol/L (ref 3.5–5.1)
Sodium: 139 mmol/L (ref 135–145)
Total Bilirubin: 0.3 mg/dL (ref 0.0–1.2)
Total Protein: 7.2 g/dL (ref 6.5–8.1)

## 2024-06-25 LAB — CBC WITH DIFFERENTIAL/PLATELET
Abs Immature Granulocytes: 0.02 K/uL (ref 0.00–0.07)
Basophils Absolute: 0 K/uL (ref 0.0–0.1)
Basophils Relative: 0 %
Eosinophils Absolute: 0 K/uL (ref 0.0–0.5)
Eosinophils Relative: 0 %
HCT: 39.5 % (ref 39.0–52.0)
Hemoglobin: 12.9 g/dL — ABNORMAL LOW (ref 13.0–17.0)
Immature Granulocytes: 0 %
Lymphocytes Relative: 9 %
Lymphs Abs: 0.8 K/uL (ref 0.7–4.0)
MCH: 32.3 pg (ref 26.0–34.0)
MCHC: 32.7 g/dL (ref 30.0–36.0)
MCV: 98.8 fL (ref 80.0–100.0)
Monocytes Absolute: 0 K/uL — ABNORMAL LOW (ref 0.1–1.0)
Monocytes Relative: 1 %
Neutro Abs: 7.3 K/uL (ref 1.7–7.7)
Neutrophils Relative %: 90 %
Platelets: 205 K/uL (ref 150–400)
RBC: 4 MIL/uL — ABNORMAL LOW (ref 4.22–5.81)
RDW: 13.6 % (ref 11.5–15.5)
WBC: 8.1 K/uL (ref 4.0–10.5)
nRBC: 0 % (ref 0.0–0.2)

## 2024-06-25 LAB — PROTIME-INR
INR: 1.1 (ref 0.8–1.2)
Prothrombin Time: 14.3 s (ref 11.4–15.2)

## 2024-06-25 MED ORDER — DEXAMETHASONE SODIUM PHOSPHATE 10 MG/ML IJ SOLN
10.0000 mg | Freq: Once | INTRAMUSCULAR | Status: AC
  Administered 2024-06-25: 10 mg via INTRAVENOUS
  Filled 2024-06-25: qty 1

## 2024-06-25 MED ORDER — PREDNISONE 20 MG PO TABS
60.0000 mg | ORAL_TABLET | Freq: Once | ORAL | Status: AC
  Administered 2024-06-25: 60 mg via ORAL
  Filled 2024-06-25: qty 3

## 2024-06-25 MED ORDER — PREDNISONE 20 MG PO TABS: ORAL_TABLET | ORAL | 0 refills | Status: AC

## 2024-06-25 NOTE — Discharge Instructions (Signed)
 Follow up with the family doctor in the office.  They may need to refer you to a dermatologist.  Please return for rapid spreading or if you develop a fever.

## 2024-06-25 NOTE — ED Provider Triage Note (Signed)
 Emergency Medicine Provider Triage Evaluation Note  Jaime [name] , a 68 y.o. male  was evaluated in triage.  Pt complains of facial swelling and rash over arms and chest after being given ciprofloxacin with known allergy, seen yesterday for same complaint. Pictures in chart. Says rash appears similar to yesterday. Rash is pruritic and helped with hydrocortisone . However, facial swelling has increased.  Denies difficulty breathing, dysphagia, chest pain, fever.   Review of Systems  Positive: N/a Negative: N/a  Physical Exam  BP 120/68 (BP Location: Right Arm)   Pulse 90   Temp 97.8 F (36.6 C)   Resp 18   Ht 5' 10 (1.778 m)   Wt 60 kg   SpO2 100%   BMI 18.98 kg/m  Gen:   Awake, no distress   Resp:  Normal effort  MSK:   Moves extremities without difficulty  Other:    Medical Decision Making  Medically screening exam initiated at 1:14 PM.  Appropriate orders placed.  Jaime [name] was informed that the remainder of the evaluation will be completed by another provider, this initial triage assessment does not replace that evaluation, and the importance of remaining in the ED until their evaluation is complete.    Jaime [name], [place] 06/25/24 1315

## 2024-06-25 NOTE — ED Notes (Signed)
 Pt states Lt sided facial droop is normal from a previous brain Sx. Pt is here for swelling under his Lt eye. Pt was seen here recently

## 2024-06-25 NOTE — ED Triage Notes (Signed)
 Patient comes in for facial swelling, recently seen for same and given meds which he has been taking and helped but symptoms never completely resolved, now they are back to the same severity as previous visit. Patient has swelling and itching to arms, chest, and face. Patient reports no problems breathing, speaking or swallowing.

## 2024-06-25 NOTE — Discharge Instructions (Signed)
 Avoid ongoing sun exposure.  Start taking the prednisone  that was prescribed earlier today.  That should be at your pharmacy.  Make an appointment to follow-up with your primary care doctor.  Return to the emergency room if you have any worsening symptoms.

## 2024-06-25 NOTE — ED Triage Notes (Addendum)
 Pt reports itchy rash on BUE, RT ear, and bilateral cheeks that started a week ago, was seen at [facility] and given some kind of cream that helped the face, but it has returned  Was DC from [facility] today and went to [facility], sent here for further eval  Denies ShoB or other s/s; denies any new soaps, lotions or detergents

## 2024-06-25 NOTE — ED Provider Notes (Signed)
  EMERGENCY DEPARTMENT AT [facility] Provider Note   CSN: [id number] Arrival date & time: 06/25/24  1230     Patient presents with: Facial Swelling   Jaime [name] is a 68 y.o. male.  {Add pertinent medical, surgical, social history, OB history to HPI:32947} HPI     Prior to Admission medications   Medication Sig Start Date End Date Taking? Authorizing Provider  predniSONE  (DELTASONE ) 20 MG tablet 3 tabs po daily x 3 days, then 2 tabs x 3 days, then 1.5 tabs x 3 days, then 1 tab x 3 days, then 0.5 tabs x 3 days 06/25/24  Yes Emil Share, DO  albuterol  (VENTOLIN  HFA) 108 (90 Base) MCG/ACT inhaler Inhale 2 puffs into the lungs every 6 (six) hours as needed. 10/24/23   Melvenia Manus BRAVO, MD  atorvastatin  (LIPITOR) 10 MG tablet Take 1 tablet (10 mg total) by mouth daily. 10/24/23   Melvenia Manus BRAVO, MD  azithromycin  (ZITHROMAX ) 250 MG tablet Take 2 tablets on day 1, then 1 tablet daily on days 2 through 5 12/08/23   Del Orbe Polanco, Iliana, FNP  benzonatate  (TESSALON ) 200 MG capsule Take 1 capsule (200 mg total) by mouth 2 (two) times daily as needed for cough. 12/14/23   Del Wilhelmena Lloyd Sola, FNP  clindamycin  (CLEOCIN ) 150 MG capsule Take 1 capsule (150 mg total) by mouth every 6 (six) hours. 06/23/24   Sharyne Darina RAMAN, MD  fluticasone (FLONASE) 50 MCG/ACT nasal spray Place 1 spray into both nostrils daily. 12/17/23   [provider]  gabapentin (NEURONTIN) 400 MG capsule Take 400 mg by mouth 2 (two) times daily.    [provider]  HYDROcodone -acetaminophen  (NORCO) 10-325 MG per tablet Take 1 tablet by mouth 4 (four) times daily.    [provider]  hydrocortisone  cream 1 % Apply to affected area 2 times daily 06/23/24   Sharyne Darina RAMAN, MD  ipratropium (ATROVENT ) 0.06 % nasal spray Place 2 sprays into both nostrils 3 (three) times daily. 12/08/23   Del Orbe Polanco, Iliana, FNP  nystatin  (MYCOSTATIN /NYSTOP ) powder Apply 1 Application  topically 3 (three) times daily. 10/24/23   Melvenia Manus BRAVO, MD  TRELEGY ELLIPTA  200-62.5-25 MCG/ACT AEPB INHALE 1 PUFF INTO THE LUNGS DAILY 12/25/23   Dixon, Phillip E, MD  valACYclovir (VALTREX) 1000 MG tablet TAKE 1 TABLET BY MOUTH DAILY 10/30/23   Dixon, Phillip E, MD    Allergies: Celebrex [celecoxib], Ciprofloxacin, Contrast media [iodinated contrast media], Dilaudid [hydromorphone], Latex, Morphine and codeine, and Sulfa antibiotics    Review of Systems  Updated Vital Signs BP 120/68 (BP Location: Right Arm)   Pulse 90   Temp 97.8 F (36.6 C)   Resp 18   Ht 5' 10 (1.778 m)   Wt 60 kg   SpO2 100%   BMI 18.98 kg/m   Physical Exam  (all labs ordered are listed, but only abnormal results are displayed) Labs Reviewed - No data to display  EKG: None  Radiology: No results found.  {Document cardiac monitor, telemetry assessment procedure when appropriate:32947} Procedures   Medications Ordered in the ED  predniSONE  (DELTASONE ) tablet 60 mg (has no administration in time range)      {Click here for ABCD2, HEART and other calculators REFRESH Note before signing:1}                              Medical Decision Making Risk Prescription drug management.   ***  {  Document critical care time when appropriate  Document review of labs and clinical decision tools ie CHADS2VASC2, etc  Document your independent review of radiology images and any outside records  Document your discussion with family members, caretakers and with consultants  Document social determinants of health affecting pt's care  Document your decision making why or why not admission, treatments were needed:32947:::1}   Final diagnoses:  Rash    ED Discharge Orders          Ordered    predniSONE  (DELTASONE ) 20 MG tablet        06/25/24 1357

## 2024-06-25 NOTE — ED Provider Notes (Signed)
 [place] [facility] Provider Note   CSN: [id number] Arrival date & time: 06/25/24  1813     Patient presents with: Rash   Jaime [name] is a 68 y.o. male.   Patient is a 68 year old male who presents with facial rash.  He is a very poor historian but what I can gather from his history as well as chart review, he was initially seen for some drainage from his right ear on 8/15 by ENT.  He was started on Cipro for perichondritis.  He said his ear cleared up but after starting the antibiotics he noticed facial rash and swelling as well as rash on his arms.  He says is very itchy.  He went to the ED on August 17 and was given a prescription for hydrocortisone  cream as well as Benadryl .  He said ultimately the rash improved.  However it came back again yesterday.  He was seen in the emergency department at [facility] earlier today and was given a prescription for a prednisone  tapering pack.  He was encouraged to have follow-up with his PCP.  He went to [facility] following this.  They were concerned about treatment failure of the rash and possibly Stevens-Johnson's according to their notes that the patient has at bedside and sent him here for further evaluation.  Patient complains of an itchy rash to his face and arms.  He denies any oral lesions.  No swelling of his lips or tongue.  No shortness of breath.  He overall says he feels okay.  No fever, nausea or vomiting.  No history of similar symptoms in the past.  The rash appears to be only on sun exposed areas.  It goes up to his arms just at the level where his T-shirt would be bilaterally.  And is on his face and upper neck.  He denies significant sun exposure.       Prior to Admission medications   Medication Sig Start Date End Date Taking? Authorizing Provider  albuterol  (VENTOLIN  HFA) 108 (90 Base) MCG/ACT inhaler Inhale 2 puffs into the lungs every 6 (six) hours as needed. 10/24/23   Melvenia Manus BRAVO, MD  atorvastatin  (LIPITOR) 10 MG tablet Take 1 tablet (10 mg total) by mouth daily. 10/24/23   Melvenia Manus BRAVO, MD  azithromycin  (ZITHROMAX ) 250 MG tablet Take 2 tablets on day 1, then 1 tablet daily on days 2 through 5 12/08/23   Del Wilhelmena Falter, Hepburn, FNP  benzonatate  (TESSALON ) 200 MG capsule Take 1 capsule (200 mg total) by mouth 2 (two) times daily as needed for cough. 12/14/23   Del Wilhelmena Falter Sola, FNP  clindamycin  (CLEOCIN ) 150 MG capsule Take 1 capsule (150 mg total) by mouth every 6 (six) hours. 06/23/24   Sharyne Darina RAMAN, MD  fluticasone (FLONASE) 50 MCG/ACT nasal spray Place 1 spray into both nostrils daily. 12/17/23   [provider]  gabapentin (NEURONTIN) 400 MG capsule Take 400 mg by mouth 2 (two) times daily.    [provider]  HYDROcodone -acetaminophen  (NORCO) 10-325 MG per tablet Take 1 tablet by mouth 4 (four) times daily.    [provider]  hydrocortisone  cream 1 % Apply to affected area 2 times daily 06/23/24   Sharyne Darina RAMAN, MD  ipratropium (ATROVENT ) 0.06 % nasal spray Place 2 sprays into both nostrils 3 (three) times daily. 12/08/23   Del Orbe Polanco, Iliana, FNP  nystatin  (MYCOSTATIN /NYSTOP ) powder Apply 1 Application topically 3 (  three) times daily. 10/24/23   Melvenia Manus BRAVO, MD  predniSONE  (DELTASONE ) 20 MG tablet 3 tabs po daily x 3 days, then 2 tabs x 3 days, then 1.5 tabs x 3 days, then 1 tab x 3 days, then 0.5 tabs x 3 days 06/25/24   Emil Share, DO  TRELEGY ELLIPTA  200-62.5-25 MCG/ACT AEPB INHALE 1 PUFF INTO THE LUNGS DAILY 12/25/23   Dixon, Phillip E, MD  valACYclovir (VALTREX) 1000 MG tablet TAKE 1 TABLET BY MOUTH DAILY 10/30/23   Dixon, Phillip E, MD    Allergies: Celebrex [celecoxib], Ciprofloxacin, Contrast media [iodinated contrast media], Dilaudid [hydromorphone], Latex, Morphine and codeine, and Sulfa antibiotics    Review of Systems  Constitutional:  Negative for chills, diaphoresis, fatigue and fever.   HENT:  Negative for congestion, rhinorrhea and sneezing.   Eyes: Negative.   Respiratory:  Negative for cough, chest tightness and shortness of breath.   Cardiovascular:  Negative for chest pain and leg swelling.  Gastrointestinal:  Negative for abdominal pain, diarrhea, nausea and vomiting.  Genitourinary:  Negative for difficulty urinating, flank pain and frequency.  Musculoskeletal:  Negative for arthralgias and back pain.  Skin:  Positive for rash.  Neurological:  Negative for dizziness, speech difficulty, weakness, numbness and headaches.    Updated Vital Signs BP (!) 131/93   Pulse 70   Temp 98.1 F (36.7 C) (Oral)   Resp 17   Ht 5' 10 (1.778 m)   Wt 60 kg   SpO2 99%   BMI 18.98 kg/m   Physical Exam Constitutional:      Appearance: He is well-developed.  HENT:     Head: Normocephalic and atraumatic.     Mouth/Throat:     Comments: No angioedema, no oral lesions noted, he does have swelling and redness diffusely around his face.   Eyes:     Pupils: Pupils are equal, round, and reactive to light.  Cardiovascular:     Rate and Rhythm: Normal rate and regular rhythm.     Heart sounds: Normal heart sounds.  Pulmonary:     Effort: Pulmonary effort is normal. No respiratory distress.     Breath sounds: Normal breath sounds. No wheezing or rales.  Chest:     Chest wall: No tenderness.  Abdominal:     General: Bowel sounds are normal.     Palpations: Abdomen is soft.     Tenderness: There is no abdominal tenderness. There is no guarding or rebound.  Musculoskeletal:        General: Normal range of motion.     Cervical back: Normal range of motion and neck supple.  Lymphadenopathy:     Cervical: No cervical adenopathy.  Skin:    General: Skin is warm and dry.     Findings: Rash present.     Comments: Positive redness and swelling to his face and down into his neck up to the level of where his T-shirt would be.  He has some redness to both of his arms.  It stops at  the level of his T-shirt.  There is no other rash to his trunk or lower extremities.  No petechiae.  There are some small areas of purpura no vesicles  Neurological:     Mental Status: He is alert and oriented to person, place, and time.          (all labs ordered are listed, but only abnormal results are displayed) Labs Reviewed  CBC WITH DIFFERENTIAL/PLATELET - Abnormal; Notable for the following  components:      Result Value   RBC 4.00 (*)    Hemoglobin 12.9 (*)    Monocytes Absolute 0.0 (*)    All other components within normal limits  COMPREHENSIVE METABOLIC PANEL WITH GFR - Abnormal; Notable for the following components:   Glucose, Bld 169 (*)    BUN 6 (*)    Creatinine, Ser 0.57 (*)    All other components within normal limits  PROTIME-INR    EKG: None  Radiology: No results found.   Procedures   Medications Ordered in the ED  dexamethasone  (DECADRON ) injection 10 mg (10 mg Intravenous Given 06/25/24 2013)                                    Medical Decision Making Amount and/or Complexity of Data Reviewed Labs: ordered.  Risk Prescription drug management.   This patient presents to the ED for concern of rash, this involves an extensive number of treatment options, and is a complaint that carries with it a high risk of complications and morbidity.  I considered the following differential and admission for this acute, potentially life threatening condition.  The differential diagnosis includes allergic reaction, medication reaction, Stevens-Johnson's, TTP  MDM:    Patient is a 68 year old who presents with a rash.  He has had it intermittently over the last week since he was started on Cipro.  The Cipro has been stopped.  He was sent here for concerns for possible Stevens-Johnson.  He overall is well-appearing.  He does not have any oral lesions.  He does not have any blistering.  He has swelling and erythema that is itchy to his face and upper extremities.   However interestingly, it appears to be just on sun exposed areas.  It stops at his T-shirt lines.  Question whether this is a photosensitivity reaction to the antibiotics that he was on.  I do not see any oral lesions or other concerns for [employer].  He has some areas of purpura on his upper extremities but it seems to be on areas where he had been rubbing on it from the itching.  His labs are nonconcerning.  He does not have any thrombocytopenia.  No concerns for infection.  He was given a dose of Decadron  in the ED and his swelling and itching has improved.  He does not have any airway involvement.  I do not see any indication for admission at this point.  He was given a prescription for prednisone  tapering pack at his previous ED visit today.  Will start that tomorrow.  He also has Benadryl  at home to use for itching.  Return precautions were given.  He was encouraged to have follow-up with his PCP.  (Labs, imaging, consults)  Labs: I Ordered, and personally interpreted labs.  The pertinent results include: Normal platelet count, normal white count, normal electrolytes, normal kidney function  Imaging Studies ordered: I ordered imaging studies including none I independently visualized and interpreted imaging. I agree with the radiologist interpretation  Additional history obtained from chart.  External records from outside source obtained and reviewed including prior notes  Cardiac Monitoring: The patient was maintained on a cardiac monitor.  If on the cardiac monitor, I personally viewed and interpreted the cardiac monitored which showed an underlying rhythm of: Sinus rhythm  Reevaluation: After the interventions noted above, I reevaluated the patient and found that they have :improved  Social Determinants of Health:    Disposition: Discharged home  Co morbidities that complicate the patient evaluation  Past Medical History:  Diagnosis Date   Anxiety    Arthritis    BPH  (benign prostatic hyperplasia)    BPH (benign prostatic hyperplasia) 06/08/2017   Chest pain    Chronic pain    neck   Constipation    Genital herpes    Hypothyroidism    Neuropathy    Paralysis, unspecified    Upper extremity weakness s/p severe neck injury   Tobacco abuse      Medicines Meds ordered this encounter  Medications   dexamethasone  (DECADRON ) injection 10 mg    I have reviewed the patients home medicines and have made adjustments as needed  Problem List / ED Course: Problem List Items Addressed This Visit   None Visit Diagnoses       Rash    -  Primary                Final diagnoses:  Rash    ED Discharge Orders     None          Lenor Hollering, MD 06/25/24 2343

## 2024-06-27 NOTE — Telephone Encounter (Signed)
 This phone note started 04/11/2024 with initial contact for follow up and all additional outreaches and outcome.   Jaime [name]

## 2024-07-14 ENCOUNTER — Encounter (HOSPITAL_BASED_OUTPATIENT_CLINIC_OR_DEPARTMENT_OTHER): Payer: Self-pay

## 2024-07-14 ENCOUNTER — Emergency Department (HOSPITAL_BASED_OUTPATIENT_CLINIC_OR_DEPARTMENT_OTHER)
Admission: EM | Admit: 2024-07-14 | Discharge: 2024-07-14 | Disposition: A | Discharge status: Home / Self Care | Attending: Emergency Medicine | Admitting: Emergency Medicine

## 2024-07-14 DIAGNOSIS — H60311 Diffuse otitis externa, right ear: Secondary | ICD-10-CM | POA: Insufficient documentation

## 2024-07-14 DIAGNOSIS — R42 Dizziness and giddiness: Secondary | ICD-10-CM | POA: Diagnosis not present

## 2024-07-14 DIAGNOSIS — Z91040 Latex allergy status: Secondary | ICD-10-CM | POA: Diagnosis not present

## 2024-07-14 DIAGNOSIS — H9201 Otalgia, right ear: Secondary | ICD-10-CM | POA: Diagnosis present

## 2024-07-14 MED ORDER — NEOMYCIN-POLYMYXIN-HC 3.5-10000-1 OT SUSP
4.0000 [drp] | Freq: Three times a day (TID) | OTIC | 0 refills | Status: AC
Start: 2024-07-14 — End: ?

## 2024-07-14 NOTE — ED Triage Notes (Signed)
 Right ear pain for weeks. Pain worsening over the past 3 days . Also reports drainage from right ear

## 2024-07-14 NOTE — ED Provider Notes (Signed)
 [place] [facility] Provider Note   CSN: [id number] Arrival date & time: 07/14/24  1037     Patient presents with: Otalgia   Jaime [name] is a 68 y.o. male who presents with pain in the right ear has been present for several weeks however has been worsening over the last 3 days.  Has some drainage of the right ear, endorses a history of chronic ear infections, has a hearing aid in the right ear as well.  Does endorse intermittent dizziness however states that this is baseline for him.    Otalgia Associated symptoms: ear discharge        Prior to Admission medications   Medication Sig Start Date End Date Taking? Authorizing Provider  neomycin -polymyxin-hydrocortisone  (CORTISPORIN) 3.5-10000-1 OTIC suspension Place 4 drops into the right ear 3 (three) times daily. 07/14/24  Yes Myriam Dorn BROCKS, PA  albuterol  (VENTOLIN  HFA) 108 (90 Base) MCG/ACT inhaler Inhale 2 puffs into the lungs every 6 (six) hours as needed. 10/24/23   Melvenia Manus BRAVO, MD  atorvastatin  (LIPITOR) 10 MG tablet Take 1 tablet (10 mg total) by mouth daily. 10/24/23   Melvenia Manus BRAVO, MD  azithromycin  (ZITHROMAX ) 250 MG tablet Take 2 tablets on day 1, then 1 tablet daily on days 2 through 5 12/08/23   Del Orbe Polanco, Iliana, FNP  benzonatate  (TESSALON ) 200 MG capsule Take 1 capsule (200 mg total) by mouth 2 (two) times daily as needed for cough. 12/14/23   Del Wilhelmena Lloyd Sola, FNP  clindamycin  (CLEOCIN ) 150 MG capsule Take 1 capsule (150 mg total) by mouth every 6 (six) hours. 06/23/24   Sharyne Darina RAMAN, MD  fluticasone (FLONASE) 50 MCG/ACT nasal spray Place 1 spray into both nostrils daily. 12/17/23   [provider]  gabapentin (NEURONTIN) 400 MG capsule Take 400 mg by mouth 2 (two) times daily.    [provider]  HYDROcodone -acetaminophen  (NORCO) 10-325 MG per tablet Take 1 tablet by mouth 4 (four) times daily.    [provider]  hydrocortisone   cream 1 % Apply to affected area 2 times daily 06/23/24   Sharyne Darina RAMAN, MD  ipratropium (ATROVENT ) 0.06 % nasal spray Place 2 sprays into both nostrils 3 (three) times daily. 12/08/23   Del Orbe Polanco, Iliana, FNP  nystatin  (MYCOSTATIN /NYSTOP ) powder Apply 1 Application topically 3 (three) times daily. 10/24/23   Melvenia Manus BRAVO, MD  predniSONE  (DELTASONE ) 20 MG tablet 3 tabs po daily x 3 days, then 2 tabs x 3 days, then 1.5 tabs x 3 days, then 1 tab x 3 days, then 0.5 tabs x 3 days 06/25/24   Emil Share, DO  TRELEGY ELLIPTA  200-62.5-25 MCG/ACT AEPB INHALE 1 PUFF INTO THE LUNGS DAILY 12/25/23   Dixon, Phillip E, MD  valACYclovir (VALTREX) 1000 MG tablet TAKE 1 TABLET BY MOUTH DAILY 10/30/23   Dixon, Phillip E, MD    Allergies: Celebrex [celecoxib], Ciprofloxacin, Contrast media [iodinated contrast media], Dilaudid [hydromorphone], Latex, Morphine and codeine, and Sulfa antibiotics    Review of Systems  HENT:  Positive for ear discharge and ear pain.   All other systems reviewed and are negative.   Updated Vital Signs BP 118/73 (BP Location: Left Arm)   Pulse 84   Temp 98.3 F (36.8 C) (Oral)   Resp 17   SpO2 99%   Physical Exam Vitals and nursing note reviewed.  Constitutional:      General: He is not in acute distress.  Appearance: He is well-developed.  HENT:     Head: Normocephalic and atraumatic.     Right Ear: Tympanic membrane normal. Drainage and swelling present.     Ears:     Comments: Right ear canal is macerated and erythematous as well as edematous.  Denies tenderness with palpation of the tragus. Eyes:     Conjunctiva/sclera: Conjunctivae normal.  Cardiovascular:     Rate and Rhythm: Normal rate and regular rhythm.     Heart sounds: No murmur heard. Pulmonary:     Effort: Pulmonary effort is normal. No respiratory distress.     Breath sounds: Normal breath sounds.  Abdominal:     Palpations: Abdomen is soft.     Tenderness: There is no abdominal  tenderness.  Musculoskeletal:        General: No swelling.     Cervical back: Neck supple.  Skin:    General: Skin is warm and dry.     Capillary Refill: Capillary refill takes less than 2 seconds.  Neurological:     Mental Status: He is alert.  Psychiatric:        Mood and Affect: Mood normal.     (all labs ordered are listed, but only abnormal results are displayed) Labs Reviewed - No data to display  EKG: None  Radiology: No results found.   Procedures   Medications Ordered in the ED - No data to display                                  Medical Decision Making Risk Prescription drug management.   Given this patient's presenting complaint along with physical exam findings of maceration and erythema along with edema of the right external canal, pannus patient has otitis externa of the right ear.  As such, discussed with patient cleaning the hearing aid that he has on the right side as well as prescribing him a course of neomycin  eardrops, encouraged follow-up with primary care.  As he has no concerning signs or symptoms at this time, 20 he is stable for discharge and outpatient management.     Final diagnoses:  Acute diffuse otitis externa of right ear    ED Discharge Orders          Ordered    neomycin -polymyxin-hydrocortisone  (CORTISPORIN) 3.5-10000-1 OTIC suspension  3 times daily        07/14/24 1115               Myriam Dorn BROCKS, [place] 07/14/24 1119    Charlyn Sora, MD 07/14/24 1343

## 2024-08-05 ENCOUNTER — Other Ambulatory Visit (HOSPITAL_COMMUNITY): Payer: Self-pay | Admitting: Critical Care Medicine

## 2024-08-05 ENCOUNTER — Encounter: Payer: Self-pay | Admitting: Critical Care Medicine

## 2024-08-05 DIAGNOSIS — R1313 Dysphagia, pharyngeal phase: Secondary | ICD-10-CM

## 2024-12-02 ENCOUNTER — Other Ambulatory Visit: Payer: Self-pay | Admitting: Internal Medicine

## 2024-12-02 DIAGNOSIS — E782 Mixed hyperlipidemia: Secondary | ICD-10-CM

## 2025-01-20 ENCOUNTER — Ambulatory Visit
